# Patient Record
Sex: Female | Born: 1935 | Race: White | Hispanic: No | Smoking: Never smoker
Health system: Southern US, Community
[De-identification: ages and names within clinical notes are randomized; demographics above are authoritative.]

## PROBLEM LIST (undated history)

## (undated) DIAGNOSIS — J189 Pneumonia, unspecified organism: Secondary | ICD-10-CM

## (undated) DIAGNOSIS — M1990 Unspecified osteoarthritis, unspecified site: Secondary | ICD-10-CM

## (undated) DIAGNOSIS — E039 Hypothyroidism, unspecified: Secondary | ICD-10-CM

## (undated) DIAGNOSIS — R0489 Hemorrhage from other sites in respiratory passages: Secondary | ICD-10-CM

## (undated) DIAGNOSIS — K219 Gastro-esophageal reflux disease without esophagitis: Secondary | ICD-10-CM

## (undated) HISTORY — DX: Gastro-esophageal reflux disease without esophagitis: K21.9

## (undated) HISTORY — DX: Pneumonia, unspecified organism: J18.9

## (undated) HISTORY — DX: Hemorrhage from other sites in respiratory passages: R04.89

## (undated) HISTORY — DX: Hypothyroidism, unspecified: E03.9

## (undated) HISTORY — DX: Unspecified osteoarthritis, unspecified site: M19.90

---

## 1957-01-25 HISTORY — PX: TOTAL ABDOMINAL HYSTERECTOMY: SHX209

## 1968-01-26 HISTORY — PX: CARPAL TUNNEL RELEASE: SHX101

## 1977-01-25 DIAGNOSIS — R0489 Hemorrhage from other sites in respiratory passages: Secondary | ICD-10-CM

## 1977-01-25 HISTORY — DX: Hemorrhage from other sites in respiratory passages: R04.89

## 2001-01-25 HISTORY — PX: TOTAL KNEE ARTHROPLASTY: SHX125

## 2002-09-18 ENCOUNTER — Encounter: Payer: Self-pay | Admitting: Specialist

## 2002-09-21 ENCOUNTER — Inpatient Hospital Stay (HOSPITAL_COMMUNITY): Admission: RE | Admit: 2002-09-21 | Discharge: 2002-09-27 | Payer: Self-pay | Admitting: Specialist

## 2002-09-26 ENCOUNTER — Encounter: Payer: Self-pay | Admitting: Internal Medicine

## 2007-01-12 ENCOUNTER — Ambulatory Visit: Payer: Self-pay | Admitting: Pulmonary Disease

## 2007-01-12 DIAGNOSIS — E039 Hypothyroidism, unspecified: Secondary | ICD-10-CM | POA: Insufficient documentation

## 2007-01-12 DIAGNOSIS — J479 Bronchiectasis, uncomplicated: Secondary | ICD-10-CM

## 2007-01-12 DIAGNOSIS — R0609 Other forms of dyspnea: Secondary | ICD-10-CM

## 2007-01-12 DIAGNOSIS — K219 Gastro-esophageal reflux disease without esophagitis: Secondary | ICD-10-CM | POA: Insufficient documentation

## 2007-01-12 DIAGNOSIS — M1990 Unspecified osteoarthritis, unspecified site: Secondary | ICD-10-CM | POA: Insufficient documentation

## 2007-01-12 DIAGNOSIS — J309 Allergic rhinitis, unspecified: Secondary | ICD-10-CM | POA: Insufficient documentation

## 2007-01-13 ENCOUNTER — Telehealth (INDEPENDENT_AMBULATORY_CARE_PROVIDER_SITE_OTHER): Payer: Self-pay | Admitting: *Deleted

## 2007-01-13 ENCOUNTER — Encounter: Payer: Self-pay | Admitting: Pulmonary Disease

## 2007-01-17 ENCOUNTER — Telehealth (INDEPENDENT_AMBULATORY_CARE_PROVIDER_SITE_OTHER): Payer: Self-pay | Admitting: *Deleted

## 2007-02-10 ENCOUNTER — Ambulatory Visit: Payer: Self-pay | Admitting: Pulmonary Disease

## 2007-02-10 DIAGNOSIS — J449 Chronic obstructive pulmonary disease, unspecified: Secondary | ICD-10-CM

## 2007-02-10 DIAGNOSIS — J4489 Other specified chronic obstructive pulmonary disease: Secondary | ICD-10-CM | POA: Insufficient documentation

## 2007-04-18 ENCOUNTER — Telehealth (INDEPENDENT_AMBULATORY_CARE_PROVIDER_SITE_OTHER): Payer: Self-pay | Admitting: *Deleted

## 2007-11-29 ENCOUNTER — Ambulatory Visit: Payer: Self-pay | Admitting: Genetic Counselor

## 2008-11-07 ENCOUNTER — Encounter: Payer: Self-pay | Admitting: Internal Medicine

## 2009-02-26 ENCOUNTER — Encounter: Admission: RE | Admit: 2009-02-26 | Discharge: 2009-02-26 | Payer: Self-pay | Admitting: Specialist

## 2009-04-11 ENCOUNTER — Telehealth (INDEPENDENT_AMBULATORY_CARE_PROVIDER_SITE_OTHER): Payer: Self-pay | Admitting: *Deleted

## 2009-04-15 ENCOUNTER — Ambulatory Visit: Payer: Self-pay | Admitting: Internal Medicine

## 2009-04-17 ENCOUNTER — Telehealth: Payer: Self-pay | Admitting: Internal Medicine

## 2009-04-17 ENCOUNTER — Encounter: Payer: Self-pay | Admitting: Internal Medicine

## 2009-05-14 ENCOUNTER — Ambulatory Visit: Payer: Self-pay | Admitting: Internal Medicine

## 2009-05-22 ENCOUNTER — Encounter: Payer: Self-pay | Admitting: Internal Medicine

## 2009-05-26 ENCOUNTER — Telehealth: Payer: Self-pay | Admitting: Internal Medicine

## 2009-05-30 ENCOUNTER — Telehealth (INDEPENDENT_AMBULATORY_CARE_PROVIDER_SITE_OTHER): Payer: Self-pay | Admitting: *Deleted

## 2009-07-08 ENCOUNTER — Encounter: Payer: Self-pay | Admitting: Internal Medicine

## 2009-07-16 ENCOUNTER — Encounter: Payer: Self-pay | Admitting: Internal Medicine

## 2009-07-21 ENCOUNTER — Telehealth (INDEPENDENT_AMBULATORY_CARE_PROVIDER_SITE_OTHER): Payer: Self-pay | Admitting: *Deleted

## 2009-08-27 ENCOUNTER — Telehealth (INDEPENDENT_AMBULATORY_CARE_PROVIDER_SITE_OTHER): Payer: Self-pay | Admitting: *Deleted

## 2010-02-22 LAB — CONVERTED CEMR LAB
Albumin: 3.8 g/dL (ref 3.5–5.2)
Basophils Absolute: 0.1 10*3/uL (ref 0.0–0.1)
Chloride: 103 meq/L (ref 96–112)
Eosinophils Absolute: 0.4 10*3/uL (ref 0.0–0.6)
GFR calc Af Amer: 69 mL/min
GFR calc non Af Amer: 57 mL/min
Glucose, Bld: 130 mg/dL — ABNORMAL HIGH (ref 70–99)
HCT: 38.8 % (ref 36.0–46.0)
Hemoglobin: 13.2 g/dL (ref 12.0–15.0)
IgM, Serum: 73 mg/dL (ref 60–263)
Lymphocytes Relative: 33.1 % (ref 12.0–46.0)
MCHC: 34 g/dL (ref 30.0–36.0)
MCV: 92.4 fL (ref 78.0–100.0)
Neutro Abs: 3.1 10*3/uL (ref 1.4–7.7)
Neutrophils Relative %: 52.8 % (ref 43.0–77.0)
Platelets: 231 10*3/uL (ref 150–400)
RBC: 4.19 M/uL (ref 3.87–5.11)
Rhuematoid fact SerPl-aCnc: <20 intl units/mL — ABNORMAL LOW (ref 0.0–20.0)
Sed Rate: 23 mm/hr (ref 0–25)
Sodium: 140 meq/L (ref 135–145)
Total Bilirubin: 0.6 mg/dL (ref 0.3–1.2)

## 2010-02-26 NOTE — Progress Notes (Signed)
Summary: OXYGEN INCREASE  Phone Note Call from Patient   Caller: Patient Call For: WERT Summary of Call: CALLING TO SEE IF ORDER TO HAVE OXYGEN INCREASED HAS BEEN DONE Initial call taken by: Rickard Patience,  May 30, 2009 9:39 AM  Follow-up for Phone Call        Pt informed that order was sent to [facility] in [place] on 05-26-09.  Pt is going to call company to f/u. Abigail Miyamoto RN  May 30, 2009 9:49 AM

## 2010-02-26 NOTE — Progress Notes (Signed)
Summary: ? regarding neb meds  Phone Note Call from Patient Call back at [facility] Phone [phone]   Caller: Patient Call For: Roey Coopman Reason for Call: Talk to Nurse Summary of Call: pt was here, given Budesonide.  Told to use 1 vial in nebulizer with her Brovana.  BUT, her Rosalyn Gess says not to use anything with it.  Please advise. Initial call taken by: Eugene Gavia,  April 17, 2009 3:44 PM  Follow-up for Phone Call        advised ok to mix these together.Carron Curie CMA  April 17, 2009 4:46 PM

## 2010-02-26 NOTE — Progress Notes (Signed)
Summary: Rx to levaquin, requesting Avelox  Phone Note Call from Patient   Caller: Patient Call For: wert Summary of Call: levaquin causing nausea would like avelox instead. prev[facility] pharmacy  Initial call taken by: Rickard Patience,  July 21, 2009 9:07 AM  Follow-up for Phone Call        Spoke with pt.  Pt states she started levaquin on Friday night because she was coughing up yellow mucus.  States she has taking it for 3 days but it is causing nausea.  States she is taking it with a meal.  Rqeuesting levaquin to be changed.  States she has taken avelox in the past and this worked well.  [facility].  Will forward message to MW-pls advise if ok to change levaquin to another abx.  Thanks!  ***ok to leave message on pt's answering machine if she is not home*** Follow-up by: Gweneth Dimitri RN,  July 21, 2009 9:27 AM  Additional Follow-up for Phone Call Additional follow up Details #1::        ok with me avelox x 5 days Additional Follow-up by: Nyoka Cowden MD,  July 21, 2009 10:02 AM    Additional Follow-up for Phone Call Additional follow up Details #2::    Rx was sent to prevo in [place].  LMOM for pt to be made aware Follow-up by: Vernie Murders,  July 21, 2009 10:07 AM  New/Updated Medications: AVELOX 400 MG TABS (MOXIFLOXACIN HCL) 1 by mouth once daily until gone Prescriptions: AVELOX 400 MG TABS (MOXIFLOXACIN HCL) 1 by mouth once daily until gone  #5 x 0   Entered by:   Vernie Murders   Authorized by:   Nyoka Cowden MD   Signed by:   Vernie Murders on 07/21/2009   Method used:   Electronically to        [facility] (retail)       [address]       [place], [place]  [address]       Ph: [phone] or [phone]       Fax: [phone]   RxID:   [id number]

## 2010-02-26 NOTE — Medication Information (Signed)
Summary: [facility]  [facility]   Imported By: Sherian Rein 04/23/2009 07:31:23  _____________________________________________________________________  External Attachment:    Type:   Image     Comment:   External Document

## 2010-02-26 NOTE — Progress Notes (Signed)
Summary: ONO results given  Phone Note Outgoing Call   Initial call taken by: Vernie Murders,  May 26, 2009 3:42 PM Call placed by: Vernie Murders,  May 26, 2009 3:43 PM Call placed to: Patient Summary of Call: Spoke with pt and advised that per MW- her ONO results show that pt does need nocturnal o2 at 2 lpm.  Pt verbalized understanding and states that she has been on 1.5 lpm at hs previously.  Will send order to [facility] to have increased to 2 lpm. Initial call taken by: Vernie Murders,  May 26, 2009 3:44 PM

## 2010-02-26 NOTE — Assessment & Plan Note (Signed)
Summary: Pulmonary/ new pt eval for bronchiectasis/ hfa 75% effective   Visit Type:  Initial Consult Primary Provider/Referring Provider:  Dr. Martha Clan  CC:  COPD.  History of Present Illness: 43 yowf never smoker carries dx of bronchiectasis since the 80's on xopenex daily with "good control" then switched over to brovana and not doing as well since then.  April 15, 2009 sp hospitalization at [facility] with pna 2/18 - 20/11 and much better in am after brovana at 8 am but by 2 pm feels she needs something else so using various other bronchodilators.  Pt denies any significant sore throat, dysphagia, itching, sneezing,  nasal congestion or excess secretions,  fever, chills, sweats, unintended wt loss, pleuritic or exertional cp, hempoptysis, change in activity tolerance  orthopnea pnd or leg swelling Pt also denies any obvious fluctuation in symptoms with weather or environmental change or other alleviating or aggravating factors.     no typical noct or early am flares  Current Medications (verified): 1)  Synthroid 50 Mcg  Tabs (Levothyroxine Sodium) .Marland Kitchen.. 1 By Mouth Daily 2)  Proventil Hfa 108 (90 Base) Mcg/act  Aers (Albuterol Sulfate) .... As Needed 3)  Flonase 50 Mcg/act  Susp (Fluticasone Propionate) .Marland Kitchen.. 1 Spray Eash Nostril At Bedtime As Needed 4)  Adult Aspirin Low Strength 81 Mg  Tbdp (Aspirin) .Marland Kitchen.. 1 By Mouth Daily 5)  Ranitidine Hcl 150 Mg Caps (Ranitidine Hcl) .Marland Kitchen.. 1 Once Daily 6)  Mucinex 600 Mg  Tb12 (Guaifenesin) .Marland Kitchen.. 1 By Mouth As Needed 7)  Oxygen 1.5 Liters .... At Bedtime 8)  Brovana 15 Mcg/37ml Nebu (Arformoterol Tartrate) .Marland Kitchen.. 1 Vial in Nebulizer Two Times A Day 9)  Vitamin C 500 Mg Tabs (Ascorbic Acid) .Marland Kitchen.. 1 Once Daily 10)  Vitamin D3 1000 Unit Caps (Cholecalciferol) .Marland Kitchen.. 1 Once Daily 11)  Coq10 30 Mg Caps (Coenzyme Q10) .Marland Kitchen.. 1 Once Daily 12)  Fish Oil 1000 Mg Caps (Omega-3 Fatty Acids) .Marland Kitchen.. 1 Once Daily 13)  Preservision Areds  Caps (Multiple Vitamins-Minerals) .Marland Kitchen..  1 Once Daily 14)  Ra Calcium Citrate Plus Vit D 315-200 Mg-Unit Tabs (Calcium Citrate-Vitamin D) .Marland Kitchen.. 1 Two Times A Day 15)  Centrum Silver  Tabs (Multiple Vitamins-Minerals) .Marland Kitchen.. 1 Once Daily 16)  Travatan Z 0.004 % Soln (Travoprost) .Marland Kitchen.. 1 Drop Each Eye At Bedtime 17)  Crestor 5 Mg Tabs (Rosuvastatin Calcium) .Marland Kitchen.. 1 Once Daily 18)  Tramadol-Acetaminophen 37.5-325 Mg Tabs (Tramadol-Acetaminophen) .Marland Kitchen.. 1 To 2 Every 6 Hours As Needed 19)  Vitamin E 400 Unit Caps (Vitamin E) .Marland Kitchen.. 1 Once Daily 20)  Vitamin B-6 100 Mg Tabs (Pyridoxine Hcl) .Marland Kitchen.. 1 Once Daily 21)  Restasis 0.05 % Emul (Cyclosporine) .Marland Kitchen.. 1 Drop To Each Eye Two Times A Day 22)  Ipratropium-Albuterol 0.5-2.5 (3) Mg/45ml Soln (Ipratropium-Albuterol) .Marland Kitchen.. 1 Vial in [employer] Once Daily  Allergies (verified): 1)  ! Amoxicillin  Past History:  Past Medical History: Bronchiectasis     - CT  11/07/08:  Bronchiectasis in the lingular > rml plus small HH     - HFA 235 > 75% effective April 15, 2009  Pneumonia Allergic Rhinitis Pulmonary hemorrhage 1979 Osteoarthritis Hypothyroidism GERD  Social History: Patient never smoked.  No significant alcohol use. Used to do office work. No occupational exposure, no animal exposure, no significant travel history. Lives alone  Review of Systems       The patient complains of shortness of breath with activity, acid heartburn, weight change, nasal congestion/difficulty breathing through nose, and sneezing.  The patient  denies shortness of breath at rest, productive cough, non-productive cough, coughing up blood, chest pain, irregular heartbeats, indigestion, loss of appetite, abdominal pain, difficulty swallowing, sore throat, tooth/dental problems, headaches, itching, ear ache, anxiety, depression, hand/feet swelling, joint stiffness or pain, rash, change in color of mucus, and fever.    Vital Signs:  Patient profile:   77 year old female Height:      64 inches Weight:      133.50  pounds BMI:     23.00 O2 Sat:      95 % on Room air Temp:     97.8 degrees F oral Pulse rate:   71 / minute BP sitting:   122 / 64  (left arm)  Vitals Entered By: Vernie Murders (April 15, 2009 8:54 AM)  O2 Flow:  Room air  Physical Exam  Additional Exam:  pleasant elderly wf < stated age wt 136 > 133 April 15, 2009  HEENT: nl dentition, turbinates, and orophanx. Nl external ear canals without cough reflex NECK :  without JVD/Nodes/TM/ nl carotid upstrokes bilaterally LUNGS: no acc muscle use,  a few insp pops/squeaks  without cough on insp or exp maneuvers CV:  RRR  no s3 or murmur or increase in P2, no edema  ABD:  soft and nontender with nl excursion in the supine position. No bruits or organomegaly, bowel sounds nl MS:  warm without deformities, calf tenderness, cyanosis or clubbing SKIN: warm and dry without lesions   NEURO:  alert, approp, no deficits     Impression & Recommendations:  Problem # 1:  BRONCHIECTASIS (ICD-494.0) Longstanding and apparently assoc with sign airway hyperactivity.    DDX of  difficult airways managment all start with A and  include Adherence, Ace Inhibitors, Acid Reflux, Active Sinus Disease, Alpha 1 Antitripsin deficiency, Anxiety masquerading as Airways dz,  ABPA,  allergy(esp in young), Aspiration (esp in elderly), Adverse effects of DPI,  Active smokers, plus one B  = Beta blocker use..   In this case Adherence is the likely the biggest issue and starts with  inability to use HFA effectively and also  understand that SABA treats the symptoms but doesn't get to the underlying problem (inflammation).  I used  the ananology of putting steroid cream on a rash to help explain the meaning of topical therapy and the need to get the drug to the target tissue.   Therefore needs ICS even if there is a risk of glaucoma so will need careful f/u by opthamology and trial of budosonide.  I spent extra time with the patient today explaining optimal mdi   technique.  This improved from  25-75% effective  Medications Added to Medication List This Visit: 1)  Ranitidine Hcl 150 Mg Caps (Ranitidine hcl) .Marland Kitchen.. 1 once daily 2)  Oxygen 1.5 Liters  .... At bedtime 3)  Brovana 15 Mcg/32ml Nebu (Arformoterol tartrate) .Marland Kitchen.. 1 vial in nebulizer two times a day 4)  Vitamin C 500 Mg Tabs (Ascorbic acid) .Marland Kitchen.. 1 once daily 5)  Vitamin D3 1000 Unit Caps (Cholecalciferol) .Marland Kitchen.. 1 once daily 6)  Coq10 30 Mg Caps (Coenzyme q10) .Marland Kitchen.. 1 once daily 7)  Fish Oil 1000 Mg Caps (Omega-3 fatty acids) .Marland Kitchen.. 1 once daily 8)  Preservision Areds Caps (Multiple vitamins-minerals) .Marland Kitchen.. 1 once daily 9)  Ra Calcium Citrate Plus Vit D 315-200 Mg-unit Tabs (Calcium citrate-vitamin d) .Marland Kitchen.. 1 two times a day 10)  Centrum Silver Tabs (Multiple vitamins-minerals) .Marland Kitchen.. 1 once daily 11)  Travatan Z  0.004 % Soln (Travoprost) .Marland Kitchen.. 1 drop each eye at bedtime 12)  Crestor 5 Mg Tabs (Rosuvastatin calcium) .Marland Kitchen.. 1 once daily 13)  Tramadol-acetaminophen 37.5-325 Mg Tabs (Tramadol-acetaminophen) .Marland Kitchen.. 1 to 2 every 6 hours as needed 14)  Vitamin E 400 Unit Caps (Vitamin e) .Marland Kitchen.. 1 once daily 15)  Vitamin B-6 100 Mg Tabs (Pyridoxine hcl) .Marland Kitchen.. 1 once daily 16)  Restasis 0.05 % Emul (Cyclosporine) .Marland Kitchen.. 1 drop to each eye two times a day 17)  Ipratropium-albuterol 0.5-2.5 (3) Mg/48ml Soln (Ipratropium-albuterol) .Marland Kitchen.. 1 vial in nebulzer once daily 18)  Levaquin 750 Mg Tabs (Levofloxacin) .... One tablet by mouth daily x 5 days 19)  Budesonide 0.25 Mg/50ml Susp (Budesonide) .... One twice daily with brovana  Other Orders: Misc. Referral (Misc. Ref) Misc. Referral (Misc. Ref) New Patient Level V (04540) HFA Instruction [phone])  Patient Instructions: 1)  Add budesonide 0.25 mg twice daily 2)  I will contact Dr Gwen Pounds at [facility] to get his permission to continue this longterm 3)  stop ipatropium 4)  xopenex hfa 2 puffs every 4 hours if needed 5)  GERD (REFLUX)  is a common cause of respiratory  symptoms. It commonly presents without heartburn and can be treated with medication, but also with lifestyle changes including avoidance of late meals, excessive alcohol, smoking cessation, and avoid fatty foods, chocolate, peppermint, colas, red wine, and acidic juices such as orange juice. NO MINT OR MENTHOL PRODUCTS SO NO COUGH DROPS  6)  USE SUGARLESS CANDY INSTEAD (jolley ranchers)  7)  Please schedule a follow-up appointment in 4 weeks, sooner if needed  8)  NO OIL BASED VITAMINS  9)  See Patient Care Coordinator before leaving for CT Chest 10)  Please schedule a follow-up appointment in 4 weeks, sooner if needed  Prescriptions: BUDESONIDE 0.25 MG/2ML SUSP (BUDESONIDE) one twice daily with brovana  #120 x 11   Entered and Authorized by:   Nyoka Cowden MD   Signed by:   Nyoka Cowden MD on 04/15/2009   Method used:   Print then Give to Patient   RxID:   [id number] LEVAQUIN 750 MG  TABS (LEVOFLOXACIN) One tablet by mouth daily x 5 days  #5 x 11   Entered and Authorized by:   Nyoka Cowden MD   Signed by:   Nyoka Cowden MD on 04/15/2009   Method used:   Electronically to        [facility] (retail)       [address]       [place], [place]  [address]       Ph: [phone] or [phone]       Fax: [phone]   RxID:   [id number]

## 2010-02-26 NOTE — Assessment & Plan Note (Signed)
Summary: Pulmonary/ final f/u ov   Primary Provider/Referring Provider:  Dr. Martha Clan  CC:  4 wk followup. Pt states that her breathing has improved and cough has resolved.  She c/o hoarsness since starting on budesonide.  She states that she gets hoarse after she uses med and then later the hoarsness wears off.  No other complaints today.Marland Kitchen  History of Present Illness: 81 yowf never smoker carries dx of bronchiectasis since the 80's on xopenex daily with "good control" then switched over to brovana and not doing as well since then.  April 15, 2009 sp hospitalization at [facility] with pna 2/18 - 20/11 and much better in am after brovana at 8 am but by 2 pm feels she needs something else so using various other bronchodilators.  rec add budesonide.  May 14, 2009 4 wk followup. Pt states that her breathing has improved and cough has resolved.  She c/o hoarsness since starting on budesonide.  She states that she gets hoarse after she uses med and then later the hoarsness wears off.  No other complaints today. Pt denies any significant sore throat, dysphagia, itching, sneezing,  nasal congestion or excess secretions,  fever, chills, sweats, unintended wt loss, pleuritic or exertional cp, hempoptysis, change in activity tolerance  orthopnea pnd or leg swelling Pt also denies any obvious fluctuation in symptoms with weather or environmental change or other alleviating or aggravating factors.    Pt denies any increase in rescue therapy over baseline, denies waking up needing it or having early am exacerbations of coughing/wheezing/ or dyspnea    Current Medications (verified): 1)  Synthroid 50 Mcg  Tabs (Levothyroxine Sodium) .Marland Kitchen.. 1 By Mouth Daily 2)  Flonase 50 Mcg/act  Susp (Fluticasone Propionate) .Marland Kitchen.. 1 Spray Eash Nostril At Bedtime As Needed 3)  Adult Aspirin Low Strength 81 Mg  Tbdp (Aspirin) .Marland Kitchen.. 1 By Mouth Daily 4)  Ranitidine Hcl 150 Mg Caps (Ranitidine Hcl) .Marland Kitchen.. 1 Once Daily 5)  Mucinex 600  Mg  Tb12 (Guaifenesin) .Marland Kitchen.. 1 By Mouth As Needed 6)  Oxygen 1.5 Liters .... At Bedtime 7)  Brovana 15 Mcg/23ml Nebu (Arformoterol Tartrate) .Marland Kitchen.. 1 Vial in Nebulizer Two Times A Day 8)  Vitamin C 500 Mg Tabs (Ascorbic Acid) .Marland Kitchen.. 1 Once Daily 9)  Vitamin D3 1000 Unit Caps (Cholecalciferol) .Marland Kitchen.. 1 Once Daily 10)  Coq10 30 Mg Caps (Coenzyme Q10) .Marland Kitchen.. 1 Once Daily 11)  Preservision Areds  Caps (Multiple Vitamins-Minerals) .Marland Kitchen.. 1 Once Daily 12)  Ra Calcium Citrate Plus Vit D 315-200 Mg-Unit Tabs (Calcium Citrate-Vitamin D) .Marland Kitchen.. 1 Two Times A Day 13)  Centrum Silver  Tabs (Multiple Vitamins-Minerals) .Marland Kitchen.. 1 Once Daily 14)  Travatan Z 0.004 % Soln (Travoprost) .Marland Kitchen.. 1 Drop Each Eye At Bedtime 15)  Crestor 5 Mg Tabs (Rosuvastatin Calcium) .Marland Kitchen.. 1 Once Daily 16)  Tramadol-Acetaminophen 37.5-325 Mg Tabs (Tramadol-Acetaminophen) .Marland Kitchen.. 1 To 2 Every 6 Hours As Needed 17)  Vitamin B-6 100 Mg Tabs (Pyridoxine Hcl) .Marland Kitchen.. 1 Once Daily 18)  Restasis 0.05 % Emul (Cyclosporine) .Marland Kitchen.. 1 Drop To Each Eye Two Times A Day 19)  Levaquin 750 Mg  Tabs (Levofloxacin) .... One Tablet By Mouth Daily X 5 Days 20)  Budesonide 0.25 Mg/73ml Susp (Budesonide) .... One Twice Daily With Brovana  Allergies (verified): 1)  ! Amoxicillin  Past History:  Past Medical History: Bronchiectasis     - CT  11/07/08:  Bronchiectasis in the lingular > rml plus small HH     - Sinus CT neg  04/17/09     - HFA 25 > 75%  effective April 15, 2009  Pneumonia Allergic Rhinitis Pulmonary hemorrhage 1979 Osteoarthritis Hypothyroidism GERD  Vital Signs:  Patient profile:   77 year old female Weight:      134.38 pounds O2 Sat:      93 % on Room air Temp:     97.6 degrees F oral Pulse rate:   76 / minute BP sitting:   142 / 76  (left arm)  Vitals Entered By: Vernie Murders (May 14, 2009 9:18 AM)  O2 Flow:  Room air  Physical Exam  Additional Exam:  pleasant elderly wf < stated age wt  133 April 15, 2009 > 134 May 14, 2009  HEENT: nl  dentition, turbinates, and orophanx. Nl external ear canals without cough reflex NECK :  without JVD/Nodes/TM/ nl carotid upstrokes bilaterally LUNGS: no acc muscle use,  a few insp pops/squeaks  without cough on insp or exp maneuvers CV:  RRR  no s3 or murmur or increase in P2, no edema  ABD:  soft and nontender with nl excursion in the supine position. No bruits or organomegaly, bowel sounds nl MS:  warm without deformities, calf tenderness, cyanosis or clubbing    Impression & Recommendations:  Problem # 1:  BRONCHIECTASIS (ICD-494.0) Much better control in terms of airways reactivity on option : change to Brovana and Budesonide, the equivalent of Symbicort, per neb.    Each maintenance medication was reviewed in detail including most importantly the difference between maintenance and as needed and under what circumstances the prns are to be used. See instructions for specific recommendations   Recheck sats overnight as no longer feels she needs 02 during the day and has improved so much symptomatically.  If no > 5 min @ < 89% ok to d/c 02  Medications Added to Medication List This Visit: 1)  Ranitidine Hcl 150 Mg Caps (Ranitidine hcl) .Marland Kitchen.. 1 at bedtime  Other Orders: Misc. Referral (Misc. Ref) Est. Patient Level III (44010)  Patient Instructions: 1)  Continue brovana and budesonide twice daily 2)  If your breathing worsens or you need to use your rescue inhaler (xopenex) more than twice weekly or wake up more than twice a month with any respiratory symptoms or require more than two rescue inhalers per year, we need to see you right away. 3)  Levaquin x 5 days if mucus gets nasty  4)  See Patient Care Coordinator before leaving for repeat pulse ox on Room air. If we find your oxygen level is very low, you'll need 02 on trips  5)

## 2010-02-26 NOTE — Progress Notes (Signed)
Summary: Avelox x 5 days ok further rx per her primary  Phone Note Call from Patient   Caller: Patient Call For: wert Summary of Call: need antibiotic to carry out of town would like avelox. prevo drug  [place] Initial call taken by: Rickard Patience,  August 27, 2009 9:49 AM  Follow-up for Phone Call        Called, spoke with pt.  Pt states she is "supposed to keep abx on hand."  Is going out of town for a week - requesting avelox rx to take with her and would like for it to have refills if possible.  [facility].  Will forward message to MW -pls advise if this is ok.  Thanks! Follow-up by: Gweneth Dimitri RN,  August 27, 2009 10:09 AM  Additional Follow-up for Phone Call Additional follow up Details #1::        ok x 5 days rx only - my last note indicates no longer plan regular f/u here and she can get this thru Dr Samuel Germany office in future or return here for f/u. Additional Follow-up by: Nyoka Cowden MD,  August 27, 2009 1:19 PM    Additional Follow-up for Phone Call Additional follow up Details #2::    called and spoke with pt.  pt aware of MW's recs.  informed her for future abx she will need to either be seen here by MW or can see if Dr. Samuel Germany office will send scripts.  pt verbalized understanding and stated she will check with Dr. Samuel Germany office in the future.  rx sent to pharmacy.  Aundra Millet Reynolds LPN  August 27, 2009 1:52 PM   Prescriptions: AVELOX 400 MG TABS (MOXIFLOXACIN HCL) 1 by mouth once daily until gone  #5 x 0   Entered by:   Arman Filter LPN   Authorized by:   Nyoka Cowden MD   Signed by:   Arman Filter LPN on 16/10/9602   Method used:   Electronically to        [facility] (retail)       [address]       [place], [place]  [address]       Ph: [phone] or [phone]       Fax: [phone]   RxID:   [id number]

## 2010-02-26 NOTE — Procedures (Signed)
Summary: Pulse Oximetry/IDS  Pulse Oximetry/IDS   Imported By: Sherian Rein 05/30/2009 09:58:36  _____________________________________________________________________  External Attachment:    Type:   Image     Comment:   External Document

## 2010-02-26 NOTE — Progress Notes (Signed)
Summary: change providers  Phone Note Call from Patient Call back at [phone]   Caller: Patient Call For: sood Summary of Call: Saw Dr. Craige Cotta on 1/09 would like to change providers. Initial call taken by: Darletta Moll,  April 11, 2009 9:13 AM  Follow-up for Phone Call        called, pt's home number - [facility] Gweneth Dimitri RN  April 11, 2009 9:34 AM  spoke with pt.  Pt states she would like to change from Dr. Craige Cotta to another Dr but does not have a preference as to who she wants to change to.  Advised pt that we have to have Vs's aproval for her to change doctors but he's not back in office until Monday.  She is ok with this.  Will forward to VS-pls advise if this is ok with you.  Thanks!  Follow-up by: Gweneth Dimitri RN,  April 11, 2009 10:27 AM  Additional Follow-up for Phone Call Additional follow up Details #1::        They way things were left in 2009 was that she was to follow up with her pulmonologist in [place] since her management was being done appropriately there.  If she would like to see another provider, I have no problem with this. Additional Follow-up by: Coralyn Helling MD,  April 13, 2009 10:56 AM     Appended Document: change providers Pt sched to see MW tommorrow am at 8:45- aware to bring all active meds and cd of her recent cxr from [place] with her to appt.  Pt verbalized understanding.

## 2010-02-26 NOTE — Medication Information (Signed)
Summary: [facility]   Imported By: Lester [place] 07/23/2009 09:52:26  _____________________________________________________________________  External Attachment:    Type:   Image     Comment:   External Document

## 2010-02-26 NOTE — Medication Information (Signed)
Summary: [facility]   Imported By: Lester [place] 07/11/2009 11:00:26  _____________________________________________________________________  External Attachment:    Type:   Image     Comment:   External Document

## 2010-03-31 ENCOUNTER — Telehealth (INDEPENDENT_AMBULATORY_CARE_PROVIDER_SITE_OTHER): Payer: Self-pay | Admitting: *Deleted

## 2010-03-31 ENCOUNTER — Encounter: Payer: Self-pay | Admitting: Internal Medicine

## 2010-04-03 ENCOUNTER — Telehealth (INDEPENDENT_AMBULATORY_CARE_PROVIDER_SITE_OTHER): Payer: Self-pay | Admitting: *Deleted

## 2010-04-07 NOTE — Progress Notes (Signed)
Summary: refills for Budesonide and Brovana  Phone Note Call from Patient Call back at [facility] Phone [phone]   Caller: Patient Call For: wert Summary of Call: Patient phoned stated that she is almost out of her Budesonide and Brovana patient stated that [facility] sent the request for the Budesonide last Thursday and have not recieved a response back. Lacey Stanton can be reached at [phone] please leave a message if she isnt home. Initial call taken by: Vedia Coffer,  March 31, 2010 8:34 AM  Follow-up for Phone Call        Papers signed by Dr Sherene Sires and faxed back to [facility].  LM for pt that refill info was faxed back .  Also that she is due for ov with Dr Sherene Sires. Lacey Miyamoto RN  March 31, 2010 10:39 AM

## 2010-04-14 NOTE — Medication Information (Signed)
Summary: Brovana/[facility]  Brovana/[facility]   Imported By: Lester Elma 04/07/2010 08:12:29  _____________________________________________________________________  External Attachment:    Type:   Image     Comment:   External Document

## 2010-04-14 NOTE — Progress Notes (Signed)
Summary: fax request  Phone Note From Other Clinic   Caller: judy w/ [facility] Call For: wert Summary of Call: needs last ov- also med list and diagnosis re: use of brovana and budesonid. this is for medicare audit. fax to attn: judy [phone]. contact # is [phone] x [phone] Initial call taken by: Tivis Ringer, CNA,  April 03, 2010 2:13 PM  Follow-up for Phone Call        Faxed records.//Juanita Follow-up by: Darletta Moll,  April 06, 2010 11:02 AM

## 2010-04-20 ENCOUNTER — Encounter: Payer: Self-pay | Admitting: Internal Medicine

## 2010-04-21 ENCOUNTER — Encounter: Payer: Self-pay | Admitting: Internal Medicine

## 2010-04-21 ENCOUNTER — Ambulatory Visit (INDEPENDENT_AMBULATORY_CARE_PROVIDER_SITE_OTHER)
Admission: RE | Admit: 2010-04-21 | Discharge: 2010-04-21 | Disposition: A | Discharge status: Home / Self Care | Payer: Medicare Other | Source: Ambulatory Visit | Attending: Internal Medicine | Admitting: Internal Medicine

## 2010-04-21 ENCOUNTER — Ambulatory Visit (INDEPENDENT_AMBULATORY_CARE_PROVIDER_SITE_OTHER): Payer: Medicare Other | Admitting: Internal Medicine

## 2010-04-21 VITALS — BP 130/68 | HR 66 | Temp 98.6°F | Ht 63.5 in | Wt 136.5 lb

## 2010-04-21 DIAGNOSIS — J479 Bronchiectasis, uncomplicated: Secondary | ICD-10-CM

## 2010-04-21 DIAGNOSIS — R0609 Other forms of dyspnea: Secondary | ICD-10-CM

## 2010-04-21 DIAGNOSIS — J449 Chronic obstructive pulmonary disease, unspecified: Secondary | ICD-10-CM

## 2010-04-21 DIAGNOSIS — R0600 Dyspnea, unspecified: Secondary | ICD-10-CM

## 2010-04-21 NOTE — Assessment & Plan Note (Signed)
Hi tammy

## 2010-04-21 NOTE — Progress Notes (Signed)
  Subjective:    Patient ID: Lacey Stanton, female    DOB: 1926/02/29, 77 y.o.   MRN: [id number]  HPI   34  yowf never smoker carries dx of bronchiectasis since the 80's on xopenex daily with "good control" then switched over to brovana and not doing as well since then.   April 15, 2009 sp hospitalization at [facility] with pna 2/18 - 20/11 and much better in am after brovana at 8 am but by 2 pm feels she needs something else so using various other bronchodilators. rec add budesonide to brovana and treat like asthma sinc needing so much albuterol.  May 14, 2009 4 wk followup. Pt states that her breathing has improved and cough has resolved. rec no change  04/21/2010 ov for surgical clearance for R TKR.  Sleeps on 02 every night no am exac walking around [place] do get short of breath walking to curb but does not need to stop  Past Medical History:  Bronchiectasis  - CT 11/07/08: Bronchiectasis in the lingular > rml plus small HH  - Sinus CT neg 04/17/09  - HFA 25 > 75% effective April 15, 2009  - Spirometry 04/21/2010 FEV1  0.97(53%) ratio 55%  Pneumonia  Allergic Rhinitis  Pulmonary hemorrhage 1979  Osteoarthritis      - s/p  L TKR 02/2000 >>  Difficult recovery Hypothyroidism  GERD      Review of Systems     Objective:   Physical Exam  Edentulous pleasant wf nad wt 133 April 15, 2009 > 134 May 14, 2009 > 136 04/21/2010  HEENT: nl turbinates, and orophanx. Nl external ear canals without cough reflex  NECK : without JVD/Nodes/TM/ nl carotid upstrokes bilaterally  LUNGS: no acc muscle use, a few insp pops/squeaks without cough on insp or exp maneuvers - pos hoover mid inspiration CV: RRR no s3 or murmur or increase in P2, no edema  ABD: soft and nontender with nl excursion in the supine position. No bruits or organomegaly, bowel sounds nl  MS: warm without deformities, calf tenderness, cyanosis or clubbing        Assessment & Plan:   CXR .04/21/10  There are  chronic interstitial fibrotic changes at both lung bases.

## 2010-04-21 NOTE — Patient Instructions (Addendum)
You have moderately severe airflow obstruction and are at increased risk of pulmonary complications post operatively.   If your knee gets worse to where is limiting your desired activity you should have surgery where you a have pulmonary doctor who can see you for any complications that arise  Quinine or tonic water should help with cramps if not try ginkabiloba but these are not regulated by the FDA since they are food additives

## 2010-04-21 NOTE — Assessment & Plan Note (Addendum)
   Each maintenance medication was reviewed in detail including most importantly the difference between maintenance and as needed and under what circumstances the prns are to be used.  Please see instructions for details which were reviewed in writing and the patient given a copy.    She is at significant risk of  Acute exac of copd/bronchiectaisis peri op and declined my rec she be cleared for this by a pulmonologist who has privileges at the hospital where she wants to have her surgery so will need to have it here if she wants [place] to be available to her as inpt.  I had an extended discussion with the patient today lasting 15 to 20 minutes of a 25 minute visit on the following issues:  Discussed in detail all the  indications, usual  risks and alternatives  relative to the benefits with patient who agrees to proceed with no surgery for now until knee is bad enough to warrant the risk of surgery.

## 2010-04-23 NOTE — Progress Notes (Signed)
Quick Note:  Spoke with pt and notified of results/recs per MW. Pt verbalized understanding. ______ 

## 2010-05-25 ENCOUNTER — Telehealth: Payer: Self-pay | Admitting: Internal Medicine

## 2010-05-25 NOTE — Telephone Encounter (Signed)
Spoke w/ Lacey Stanton and she states pt is in [place] hospital w/ PNA and Lacey Stanton wanted to let Dr. Sherene Sires know that Dr. Willette Pa from [facility] hospital will be contacting him today. Will forward to Dr. Sherene Sires as an Lorain Childes. Please advise. Thanks  Carver Fila, CMA

## 2010-06-05 ENCOUNTER — Inpatient Hospital Stay: Payer: Medicare Other | Admitting: Adult Health

## 2010-06-08 ENCOUNTER — Inpatient Hospital Stay: Payer: Medicare Other | Admitting: Adult Health

## 2010-06-12 NOTE — H&P (Signed)
NAME:  Lacey Stanton, Lacey Stanton NO.:  [id number]   MEDICAL RECORD NO.:  [id number]                   PATIENT TYPE:  INP   LOCATION:  NA                                   FACILITY[facility]  [facility]   PHYSICIAN:  Erasmo Leventhal, M.D.         DATE OF BIRTH:  10/01/1926   DATE OF ADMISSION:  DATE OF DISCHARGE:                                HISTORY & PHYSICAL   CHIEF COMPLAINT:  Left knee osteoarthritis.   HISTORY OF PRESENT ILLNESS:  This is a 77 year old lady with a history of  end-stage osteoarthritis of her left knee who has failed conservative  management.  Due to continued pain and difficulty ambulating with it, she is  now scheduled for a total knee replacement of the left knee.  She has seen  her medical doctor and has been given medical clearance for the surgery.  She does have a history of bronchiectasis and has been recommended to have  surgery under spinal anesthesia versus general anesthesia due to her lung  disease.  She is now scheduled for total knee replacement.  Surgery, risks,  benefits, and after care were discussed and surgery for total knee  replacement of the left knee is to go ahead as scheduled.   ALLERGIES:  AMOXICILLIN.   CURRENT MEDICATIONS:  1. Synthroid 0.05 mg daily.  2. Evista 60 mg daily.  3. Xopenex 1.25 mg nebulizer one treatment q.8h.  4. Proventil HFA two puffs q.4-6h.  5. Pravachol 80 mg one-half tablet p.o. daily.  6. Paxil CR 12.5 mg one daily.  7. Allegra 180 mg one p.o. daily.   PAST MEDICAL HISTORY:  1. Bronchiectasis.  2. Asthma.  3. GERD.  4. Depression.  5. Hypothyroidism.   PAST SURGICAL HISTORY:  1. Hysterectomy.  2. Carpal tunnel release.  3. Arthroscopy of the knee.   FAMILY HISTORY:  Positive for Alzheimer's, Parkinson's, and cancer.   SOCIAL HISTORY:  The patient is married.  Lives at home.  She does not smoke  or drink.   REVIEW OF SYSTEMS:  Central nervous system positive for depression.  CARDIOVASCULAR:  No chest pain or palpitation.  PULMONARY:  Positive for  shortness of breath, occasional PND and orthopnea.  GASTROINTESTINAL:  Positive for GERD.  GENITOURINARY:  Negative for urinary tract difficulty.  MUSCULOSKELETAL:  Positive as in HPI.   PHYSICAL EXAMINATION:  VITAL SIGNS:  Blood pressure 130/60, respirations 16,  pulse 72 and regular.  GENERAL:  This is a well-developed, well-nourished lady in no acute  distress.  HEENT:  Head normocephalic.  Nose patent.  Ears patent.  Pupils are equal,  round, and reactive to light.  Throat without injection.  NECK:  Supple without adenopathy.  Carotids 2+ without bruit.  CHEST:  Clear to auscultation.  No rales or rhonchi.  RESPIRATIONS:  16.  HEART:  Regular rate and rhythm at 72 beats per minute without murmur.  ABDOMEN:  Soft  with active bowel sounds.  No mass or organomegaly.  NEUROLOGIC:  The patient alert and oriented to time, place, and person.  Cranial nerves II-XII grossly intact.  EXTREMITIES:  Within normal limits with the exception of bilateral knee  arthritis left greater than right.  The left knee shows full extension with  further flexion to 120 degrees.  Sensation and circulation are intact.  X-  rays show end-stage osteoarthritis left knee.   IMPRESSION:  End-stage osteoarthritis left knee.   PLAN:  Total knee replacement left knee.     Jaquelyn Bitter. Chabon, P.A.                   Erasmo Leventhal, M.D.    SJC/MEDQ  D:  09/12/2002  T:  09/12/2002  Job:  [id number]

## 2010-06-12 NOTE — Discharge Summary (Signed)
NAME:  Lacey Stanton, CANAN NO.:  [id number]   MEDICAL RECORD NO.:  [id number]                   PATIENT TYPE:  INP   LOCATION:  0465                                 FACILITY:  [facility]   PHYSICIAN:  Erasmo Leventhal, M.D.         DATE OF BIRTH:  11/25/1926   DATE OF ADMISSION:  09/21/2002  DATE OF DISCHARGE:  09/27/2002                                 DISCHARGE SUMMARY   ADMITTING DIAGNOSES:  Left knee osteoarthritis.   DISCHARGE DIAGNOSES:  Left knee osteoarthritis.   OPERATION:  Total knee replacement left knee.   BRIEF HISTORY:  This is a 77 year old lady with end-stage osteoarthritis of  her left knee who failed conservative management and after discussion of  treatment options is scheduled for total knee replacement of left knee.  Her  medical history is complicated by bronchiectasis and she will have her  surgery done under spinal anesthesia.  Again, surgery, risks, benefits, and  after care were discussed with the patient.  Surgery to go ahead as  scheduled.   LABORATORIES:  Admission CBC within normal limits.  PT/PTT within normal  limits.  Admission CMET within normal limits with the exception of a  slightly low potassium and an elevated glucose.  Hemoglobin and hematocrit  reached a low of 8.2 and 23.3 on the 31st and then was back up to 10.4 and  30.5 after transfusion.  PT/PTT by discharge was in the therapeutic range  with PT 21 and INR 2.4.  She did become hyponatremic through her admission  which was treated by the medical physician and also ran an elevated glucose.   HOSPITAL COURSE:  The patient tolerated the operative procedure well.  She  was followed by the hospitalists during her admission secondary to her  bronchiectasis.  First postoperative day the patient was alert and oriented.  The dressing was dry.  Neurovascular status was grossly intact to the knee.  Hemovac was discontinued.  CPM was started that evening and  epidural  scheduled to be out the following morning.  Note that the patient cannot  take iron and so was not started on Trinsicon.  Due to her postoperative  anemia she was worked up to make sure there was no other anemia process  going on by the medical doctors and this showed just a postoperative anemia.  Second postoperative day patient continued to do well.  Vital signs were  stable.  Temperature to 100.1 max.  Neurovascular status was intact in the  leg and dressings were dry.  Dressing was changed and the wound was benign.  Third postoperative day she states she had a bad night.  She had some  moderate discomfort.  Her vital signs remained stable.  She was afebrile.  Her intake was a little more than her output.  Hemoglobin 8.9, hematocrit  26.3 and she was hyponatremic.  She is being followed by the medical doctors  for  this.  Her dressings were changed.  The wound was benign.  Popliteal  fossa was soft.  Calf muscles were negative.  No cords and negative Homan's  sign.  Lungs were clear.  Bowel sounds were sluggish.  The patient had no  shortness of breath.  On the fourth postoperative day patient continued to  do well.  Temperature was 99.8.  Dressing was changed.  Wound was benign.  Calves were negative.  Due to some mild tenderness when she was seen by the  medical doctors, Dopplers were obtained and were normal for DVT.  Her  hemoglobin and hematocrit continued to drop to 8.2 and 23.3.  She had some  mild wheezes in her lungs this a.m. but her O2 saturations were 97% on 2 L.  She had no shortness of breath, chest pain.  She was seen later that day by  the medical service who due to her chronic lung disease transfused her 2  units of packed cells to help with her oxygenation.  Postoperative day five  she was feeling better and feeling stronger after her transfusion.  Her  vital signs were stable.  She was afebrile.  Hemoglobin and hematocrit were  now 10.4 and 30.5.  BMET within  normal limits with exception of elevated  glucose.  PT was 19 with an INR of 2.  Lungs were clear.  No wheezes.  Dressings were changed.  Wound benign.  Calves were negative.  She was  continued in therapy to learn step ambulation as she had some steps she had  to work on at home.  Rehabilitation consult was obtained; however, patient  felt like she would be able to go home and use home therapy without  difficulty and on the sixth postoperative day in improved condition, stating  that she wanted to go home, patient was discharged home for follow-up in the  office.   CONDITION ON DISCHARGE:  Improved.   DISCHARGE MEDICATIONS:  1. Vicodin one to two q.6h. p.r.n. pain.  2. Robaxin 500 one p.o. q.8h. p.r.n. spasm.  3. Coumadin per pharmacy protocol.  4. The patient had been on Avelox and we will leave that up to the medical     service to determine whether they wish her to go home on that.   DISCHARGE INSTRUCTIONS:  She is instructed to use her walker to weight bear  as tolerated, proceed with her therapy, and return to see [place] in 10 days at  the office or sooner p.r.n. problems.     Jaquelyn Bitter. Chabon, P.A.                   Erasmo Leventhal, M.D.    SJC/MEDQ  D:  10/19/2002  T:  10/19/2002  Job:  [id number]

## 2010-06-12 NOTE — Op Note (Signed)
NAME:  Lacey Stanton, Lacey Stanton NO.:  [id number]   MEDICAL RECORD NO.:  [id number]                   PATIENT TYPE:  INP   LOCATION:  0004                                 FACILITY:  [facility]   PHYSICIAN:  Erasmo Leventhal, M.D.         DATE OF BIRTH:  1926/06/03   DATE OF PROCEDURE:  09/21/2002  DATE OF DISCHARGE:                                 OPERATIVE REPORT   PREOPERATIVE DIAGNOSIS:  Left knee end-stage osteoarthritis.   POSTOPERATIVE DIAGNOSIS:  Left knee end-stage osteoarthritis.   PROCEDURE:  Left total knee arthroplasty.   SURGEON:  Erasmo Leventhal, M.D.   ASSISTANT:  Jaquelyn Bitter. Chabon, P.A.   ANESTHESIA:  Epidural.   ESTIMATED BLOOD LOSS:  Less than 100 mL.   TOURNIQUET TIME:  1 hour and 25 minutes, 350 mmHg.   COMPLICATIONS:  None.   DISPOSITION:  PACU stable.   OPERATIVE IMPLANTS:  Osteonics components, femur size 7, tibia size 7,  posterior stabilized patella 26 with an 8 mm tibial insert, posterior  stabilized flexed component.   DESCRIPTION OF PROCEDURE:  The patient was counseled in the holding area,  correct side was identified. IV had been started, antibiotics given and  epidural administered. The chart was signed appropriately. Taken to the  operating room, placed in supine position. Foley catheter placed utilizing  sterile technique by the OR circulating nurse. Preoperative antibiotics were  given in the holding area, left lower extremity was elevated, divided with  flexion contracture, flexed to 125 degrees. She was elevated, prepped with  Duraprep and all was draped in a sterile fashion. Exsanguinated with  Esmarch, tourniquet was inflated to 350 mmHg. A straight midline incision  was made through the skin and subcutaneous tissue, small vein was  electrocautery, medial and lateral soft tissue flaps were developed at the  appropriate level. Medial parapatellar arthrotomy was performed, patella was  everted. Medial  soft tissue release was done secondary to the varus portion  of the knee. The patella was everted, the knee was then flexed. End-stage  osteoarthritic changes, bone against bone contact, cruciate ligaments were  resected. Starting hole made in the distal femur, canal was irrigated,  effluent was clear. An intermedullary rod was gently placed and shows a 5  degree valgus. Chose to take a 10 mm cut off the distal femur. The distal  femur was cut, found to be a size 7. Rotations marks were made, distal femur  was cut to fit a size #7. Tibial osteophytes removed, tibial eminence was  resected, medial and lateral menisci removed, geniculate vessels coagulated  throughout the entire procedure. The posterior neurovascular structure was  meticulously protected throughout the entire procedure. The proximal tibia  was found to be a size 7, starting hole was made, _________ utilized, canal  was irrigated, effluent was clear. The intermedullary rod was gently placed.  I chose a zero degree slope, took a 10 mm cut  based upon the lateral side  which gave nice 2-3 mm resection defect on the medial side. Posteromedial  femoral osteophytes removed under direct visualization. Posterolaterally  there were no osteophytes there. Femoral troch was prepared in standard  fashion. At this time with a 7 femur, 7 tibia, 10 mm insert, we had good  range of motion, soft tissue balance, flexion, extension, excellent  tracking, rotation marks were made and the delta keel performed in standard  fashion. The patella was found to be a size 26 with range of depth of 10 mm,  locking holes were made and excess bone was removed. At this point in time,  the knee was irrigated with pulsatile lavage copiously. Geniculate vessels  had been coagulated.   Utilizing modern cement technique, all quadrants were cemented in place,  size 7 tibia, size 7 femur and a 26 patella. At this time, we did trials  with 8 and 10 mm polyethylene  insert, posterior stabilized flex. We had  excellent range of motion, flexion, well balance in flexion and extension,  patellofemoral tracking was anatomic.   Tibial trial was then removed and the final 8 mm thick flex type posterior  stabilized insert was applied. We had excellent range of motion and soft  tissue balance at this point in time. Meticulous hemostasis had been  obtained. Bone wax was placed and exposed bony surfaces. Two medium Hemovac  drains were placed. The knee was then closed sequentially, irrigated with  antibiotic solution during the closure. Arthrotomy Vicryl, subcu Vicryl,  skin closed with subcuticular Monocryl suture, Steri-Strips were applied.  Xeroform placed around the drains. A compressive wrap was applied to the  knee, tourniquet was deflated, given another g of Ancef at the end of the  case. Drainage was appropriate, excellent circulation in the foot and ankle  at the end of the case. The popliteal fossa was nice and soft, circulation  was intact. The patient did well at the times of the procedure. Sponge and  needle counts correct. No complications.                                               Erasmo Leventhal, M.D.    RAC/MEDQ  D:  09/21/2002  T:  09/22/2002  Job:  [id number]

## 2010-06-15 ENCOUNTER — Encounter: Payer: Self-pay | Admitting: *Deleted

## 2010-06-15 ENCOUNTER — Ambulatory Visit (INDEPENDENT_AMBULATORY_CARE_PROVIDER_SITE_OTHER): Payer: Medicare Other | Admitting: Adult Health

## 2010-06-15 ENCOUNTER — Encounter: Payer: Self-pay | Admitting: Adult Health

## 2010-06-15 DIAGNOSIS — J479 Bronchiectasis, uncomplicated: Secondary | ICD-10-CM

## 2010-06-15 DIAGNOSIS — J189 Pneumonia, unspecified organism: Secondary | ICD-10-CM | POA: Insufficient documentation

## 2010-06-15 NOTE — Assessment & Plan Note (Signed)
Recent dx of PNA w/ admission to [facility].  Clinically improving   Plan:  Continue on current regimen.  Mucinex DM Twice daily  As needed  Cough/congestion  Fluids and rest  Advance activity as tolerated.  Follow up Dr. Sherene Sires  In 2-3 weeks with chest xray .  Please contact office for sooner follow up if symptoms do not improve or worsen or seek emergency care

## 2010-06-15 NOTE — Progress Notes (Signed)
Letter for reimbursement of airline tickets typed in Epic, reviewed and approved by TP.  Letter printed, stamped and mailed to pt's verified home address per pt's request.

## 2010-06-15 NOTE — Progress Notes (Signed)
Subjective:    Patient ID: Lacey Stanton, female    DOB: 1926/08/06, 77 y.o.   MRN: [id number]  HPI  30  yowf never smoker carries dx of bronchiectasis since the 80's on xopenex daily with "good control" then switched over to brovana and not doing as well since then.   April 15, 2009 sp hospitalization at [facility] with pna 2/18 - 20/11 and much better in am after brovana at 8 am but by 2 pm feels she needs something else so using various other bronchodilators. rec add budesonide to brovana and treat like asthma sinc needing so much albuterol.  May 14, 2009 4 wk followup. Pt states that her breathing has improved and cough has resolved. rec no change   ov for surgical clearance for R TKR.  Sleeps on 02 every night no am exac walking around [place] do get short of breath walking to curb but does not need to stop  06/15/10 [facility] follow up  Pt presents for post hospital visit. She was admitted last week to [facility] for PNA and discharged on sat. Unfortunately we do not have records at the time of dictation. She says she has finished the abx completely. She is feeling better but still has low energy.  Cough and congestion are better. No fever or hemoptysis. Appetite is slowly returning.    Past Medical History:  Bronchiectasis  - CT 11/07/08: Bronchiectasis in the lingular > rml plus small HH  - Sinus CT neg 04/17/09  - HFA 25 > 75% effective April 15, 2009  - Spirometry  FEV1  0.97(53%) ratio 55%  Pneumonia  Allergic Rhinitis  Pulmonary hemorrhage 1979  Osteoarthritis      - s/p  L TKR 02/2000 >>  Difficult recovery Hypothyroidism  GERD      Review of Systems Constitutional:   No  weight loss, night sweats,  Fevers, chills, +fatigue, or  lassitude.  HEENT:   No headaches,  Difficulty swallowing,  Tooth/dental problems, or  Sore throat,                No sneezing, itching, ear ache, nasal congestion, post nasal drip,   CV:  No chest pain,  Orthopnea, PND,  swelling in lower extremities, anasarca, dizziness, palpitations, syncope.   GI  No heartburn, indigestion, abdominal pain, nausea, vomiting, diarrhea, change in bowel habits, loss of appetite, bloody stools.   Resp:   No excess mucus, no productive cough,  No non-productive cough,  No coughing up of blood.  No change in color of mucus.  No wheezing.  No chest wall deformity  Skin: no rash or lesions.  GU: no dysuria, change in color of urine, no urgency or frequency.  No flank pain, no hematuria   MS:  No joint pain or swelling.  No decreased range of motion.     Psych:  No change in mood or affect. No depression or anxiety.  No memory loss.          Objective:   Physical Exam Edentulous pleasant wf nad  GEN: A/Ox3; pleasant , NAD, elderly.  HEENT:  Beatrice/AT,  EACs-clear, TMs-wnl, NOSE-clear, THROAT-clear, no lesions, no postnasal drip or exudate noted.   NECK:  Supple w/ fair ROM; no JVD; normal carotid impulses w/o bruits; no thyromegaly or nodules palpated; no lymphadenopathy.  RESP  Coarse BS w/ no  wheezes/ rales/ or rhonchi.no accessory muscle use, no dullness to percussion  CARD:  RRR, no m/r/g  , no  peripheral edema, pulses intact, no cyanosis or clubbing.  GI:   Soft & nt; nml bowel sounds; no organomegaly or masses detected.  Musco: Warm bil, no deformities or joint swelling noted.   Neuro: alert, no focal deficits noted.    Skin: Warm, no lesions or rashes           Assessment & Plan:

## 2010-06-15 NOTE — Patient Instructions (Signed)
Continue on current regimen.  Mucinex DM Twice daily  As needed  Cough/congestion  Fluids and rest  Advance activity as tolerated.  Follow up Dr. Sherene Sires  In 2-3 weeks with chest xray .  Please contact office for sooner follow up if symptoms do not improve or worsen or seek emergency care

## 2010-07-01 ENCOUNTER — Ambulatory Visit (INDEPENDENT_AMBULATORY_CARE_PROVIDER_SITE_OTHER)
Admission: RE | Admit: 2010-07-01 | Discharge: 2010-07-01 | Disposition: A | Discharge status: Home / Self Care | Payer: Medicare Other | Source: Ambulatory Visit | Attending: Internal Medicine | Admitting: Internal Medicine

## 2010-07-01 ENCOUNTER — Encounter: Payer: Self-pay | Admitting: Internal Medicine

## 2010-07-01 ENCOUNTER — Ambulatory Visit (INDEPENDENT_AMBULATORY_CARE_PROVIDER_SITE_OTHER): Payer: Medicare Other | Admitting: Internal Medicine

## 2010-07-01 VITALS — BP 122/68 | HR 72 | Temp 97.9°F | Ht 63.0 in | Wt 126.0 lb

## 2010-07-01 DIAGNOSIS — J449 Chronic obstructive pulmonary disease, unspecified: Secondary | ICD-10-CM

## 2010-07-01 DIAGNOSIS — J189 Pneumonia, unspecified organism: Secondary | ICD-10-CM

## 2010-07-01 DIAGNOSIS — J479 Bronchiectasis, uncomplicated: Secondary | ICD-10-CM

## 2010-07-01 MED ORDER — MOXIFLOXACIN HCL 400 MG PO TABS: 400.0000 mg | ORAL_TABLET | Freq: Every day | ORAL | Status: AC

## 2010-07-01 NOTE — Patient Instructions (Addendum)
When mucus turns nasty go ahead and take avelox 400 mg x 5 days and if not 100% better we need to see you   GERD (REFLUX)  is an extremely common cause of respiratory symptoms, many times with no significant heartburn at all.    It can be treated with medication, but also with lifestyle changes including avoidance of late meals, excessive alcohol, smoking cessation, and avoid fatty foods, chocolate, peppermint, colas, red wine, and acidic juices such as orange juice.  NO MINT OR MENTHOL PRODUCTS SO NO COUGH DROPS  USE SUGARLESS CANDY INSTEAD (jolley ranchers or Stover's)  NO OIL BASED VITAMINS (no fish oil)   If you are satisfied with your treatment plan let your doctor know and he/she can either refill your medications or you can return here when your prescription runs out.     If in any way you are not 100% satisfied,  please tell us.  If 100% better, tell your friends!

## 2010-07-01 NOTE — Assessment & Plan Note (Signed)
Ok to use Avelox x 5 day cycles

## 2010-07-01 NOTE — Assessment & Plan Note (Signed)
Cycles of avelox x 5days and continue max rx for airways dz.     Each maintenance medication was reviewed in detail including most importantly the difference between maintenance and as needed and under what circumstances the prns are to be used.  Please see instructions for details which were reviewed in writing and the patient given a copy.  See the written copy of this report in the patient's paper medical record.  These results did not interface directly into the electronic medical record and are summarized here.

## 2010-07-01 NOTE — Progress Notes (Signed)
Subjective:    Patient ID: Lacey Stanton, female    DOB: 07-May-1926, 77 y.o.   MRN: [id number]  HPI  86  yowf never smoker carries dx of bronchiectasis since the 80's on xopenex daily with "good control" then switched over to brovana and not doing as well since then.   April 15, 2009 sp hospitalization at [facility] with pna 2/18 - 20/11 and much better in am after brovana at 8 am but by 2 pm feels she needs something else so using various other bronchodilators. rec add budesonide to brovana and treat like asthma sinc needing so much albuterol.  May 14, 2009 4 wk followup. Pt states that her breathing has improved and cough has resolved. rec no change   04/21/2010 ov for surgical clearance for R TKR.  Sleeps on 02 every night no am exac walking around [place] do get short of breath walking to curb but does not need to stop rec You have moderately severe airflow obstruction and are at increased risk of pulmonary complications post operatively.  If your knee gets worse to where is limiting your desired activity you should have surgery where you a have pulmonary doctor who can see you for any complications that arise  Quinine or tonic water should help with cramps if not try ginkabiloba but these are not regulated by the FDA since they are food additives  May 23 2010 [facility] with pna > rehab x 2 weeks   06/15/10 NP/ ov p admit  to [facility] for PNA and discharged  p rehab x 2 weeks. has finished the abx completely. She is feeling better but still has low energy.  Cough and congestion are better. No fever or hemoptysis. Appetite is slowly returning.   07/01/2010 ov/Arlester Keehan doing ok on neb bud/brovana bid and rare need for xopenex.  No purulent sputum.   diabetic educator       Past Medical History:  Bronchiectasis  - CT 11/07/08: Bronchiectasis in the lingular > rml plus small HH  - Sinus CT neg 04/17/09  - HFA 25 > 75% effective April 15, 2009  - Spirometry  FEV1   0.97(53%) ratio 55%  Pneumonia > cleared radiographically 07/01/2010     - Pneumovax March 2011  Allergic Rhinitis  Pulmonary hemorrhage 1979   Osteoarthritis      - s/p  L TKR 02/2000 >>  Difficult recovery Hypothyroidism  GERD                Objective:   Physical Exam Edentulous pleasant wf nad  GEN: A/Ox3; pleasant , NAD, elderly.  HEENT:  [place]/AT,  EACs-clear, TMs-wnl, NOSE-clear, THROAT-clear, no lesions, no postnasal drip or exudate noted.   NECK:  Supple w/ fair ROM; no JVD; normal carotid impulses w/o bruits; no thyromegaly or nodules palpated; no lymphadenopathy.  RESP  no accessory muscle use, no dullness to percussion.  CARD:  RRR, no m/r/g  , no peripheral edema, pulses intact, no cyanosis or clubbing.  GI:   Soft & nt; nml bowel sounds; no organomegaly or masses detected.  Musco: Warm bil, no deformities or joint swelling noted.   Neuro: alert, no focal deficits noted.    Skin: Warm, no lesions or rashes        cxr 07/01/2010 Comparison: Chest x-ray of 04/21/2010  Findings: No active infiltrate or effusion is seen. Minimal peribronchial thickening is noted. Linear scarring or atelectasis is noted medially at the right lung base. The heart is within  normal limits in size. Surgical clips overlie the right axilla. No acute bony abnormality is seen.  IMPRESSION: No active lung disease.     Assessment & Plan:

## 2010-07-10 ENCOUNTER — Telehealth: Payer: Self-pay | Admitting: Internal Medicine

## 2010-07-10 DIAGNOSIS — J449 Chronic obstructive pulmonary disease, unspecified: Secondary | ICD-10-CM

## 2010-07-13 NOTE — Telephone Encounter (Signed)
Order for rehab referral was sent to [facility].

## 2010-07-13 NOTE — Telephone Encounter (Signed)
Spoke with Lacey Stanton at rehab and [facility] and she states the pt called and is requesting to attend their program. They are needing an order from Dr. Sherene Sires for this. Please advise if ok to send order for pulmonary rehab? Carron Curie, CMA

## 2010-07-13 NOTE — Telephone Encounter (Signed)
Ok for rehab referral

## 2010-07-15 ENCOUNTER — Encounter: Payer: Self-pay | Admitting: Internal Medicine

## 2010-07-15 ENCOUNTER — Telehealth: Payer: Self-pay | Admitting: Internal Medicine

## 2010-07-15 NOTE — Telephone Encounter (Signed)
Spoke with pt and notified of Alpha 1 test was neg. Pt verbalized understanding.

## 2010-07-23 ENCOUNTER — Telehealth: Payer: Self-pay | Admitting: Internal Medicine

## 2010-07-23 NOTE — Telephone Encounter (Signed)
LMOVM for Lorraine TCB.

## 2010-07-23 NOTE — Telephone Encounter (Signed)
Duplicate encounter

## 2010-07-24 NOTE — Telephone Encounter (Signed)
Spoke with [place] and she states they received the signed RX, nothing further needed. Carron Curie, CMA

## 2010-07-28 ENCOUNTER — Encounter: Payer: Self-pay | Admitting: Internal Medicine

## 2010-07-31 ENCOUNTER — Telehealth: Payer: Self-pay | Admitting: Internal Medicine

## 2010-07-31 NOTE — Telephone Encounter (Signed)
Called, spoke with pt.  States she is taking brovana and budesonide at 8:30am and 8:30 pm everyday.  States around 6:30pm everyday she is having SOB and feels the treatments are running out at this time.  She states her o2 sat remains 90% on RA when this happens.  She states this has been going on since May 19.  She does have rescue inhaler but states she does not use it during this time bc MW does not want her using it everyday - only in emergencies.  She is requesting OV to discuss this with MW and see what his recs are.  OV scheduled for Monday, July 9 at 10am -- pt ok with this.

## 2010-08-03 ENCOUNTER — Encounter: Payer: Self-pay | Admitting: Internal Medicine

## 2010-08-03 ENCOUNTER — Ambulatory Visit (INDEPENDENT_AMBULATORY_CARE_PROVIDER_SITE_OTHER): Payer: Medicare Other | Admitting: Internal Medicine

## 2010-08-03 VITALS — BP 116/70 | HR 75 | Temp 98.0°F | Ht 63.0 in | Wt 125.2 lb

## 2010-08-03 DIAGNOSIS — J449 Chronic obstructive pulmonary disease, unspecified: Secondary | ICD-10-CM

## 2010-08-03 DIAGNOSIS — J479 Bronchiectasis, uncomplicated: Secondary | ICD-10-CM

## 2010-08-03 DIAGNOSIS — J4489 Other specified chronic obstructive pulmonary disease: Secondary | ICD-10-CM

## 2010-08-03 MED ORDER — LEVALBUTEROL TARTRATE 45 MCG/ACT IN AERO: 1.0000 | INHALATION_SPRAY | RESPIRATORY_TRACT | Status: DC | PRN

## 2010-08-03 NOTE — Patient Instructions (Signed)
When mucus turns nasty and stays  go ahead and take avelox 400 mg x 5 days and if not 100% better we need to see you   Ok to use pm nebulizer a little bit earlier, it doesn't have to be exactly 12 hours  Continue to use the flutter and mucinex as needed   GERD (REFLUX)  is an extremely common cause of respiratory symptoms, many times with no significant heartburn at all.    It can be treated with medication, but also with lifestyle changes including avoidance of late meals, excessive alcohol, smoking cessation, and avoid fatty foods, chocolate, peppermint, colas, red wine, and acidic juices such as orange juice.  NO MINT OR MENTHOL PRODUCTS SO NO COUGH DROPS  USE SUGARLESS CANDY INSTEAD (jolley ranchers or Stover's)  NO OIL BASED VITAMINS (no fish oil)  Please schedule a follow up office visit in 4 weeks, sooner if needed with PFT's

## 2010-08-03 NOTE — Progress Notes (Signed)
Subjective:    Patient ID: Lacey Stanton, female    DOB: 07-26-1926, 77 y.o.   MRN: [id number]  HPI  66  yowf never smoker carries dx of bronchiectasis since the 80's  April 15, 2009 sp hospitalization at [facility] with pna 2/18 - 20/11 and much better in am after brovana at 8 am but by 2 pm feels she needs something else so using various other bronchodilators. rec add budesonide to brovana and treat like asthma sinc needing so much albuterol.   04/21/2010 ov for surgical clearance for R TKR.  Sleeps on 02 every night no am exac walking around [place] do get short of breath walking to curb but does not need to stop rec You have moderately severe airflow obstruction and are at increased risk of pulmonary complications post operatively.  If your knee gets worse to where is limiting your desired activity you should have surgery where you a have pulmonary doctor who can see you for any complications that arise  Quinine or tonic water should help with cramps if not try ginkabiloba but these are not regulated by the FDA since they are food additives   07/01/2010 ov/Lacey Stanton doing ok on neb bud/brovana bid and rare need for xopenex.  No purulent sputum.  rec When mucus turns nasty go ahead and take avelox 400 mg x 5 days and if not 100% better we need to see you   GERD (REFLUX)  Diet  08/03/2010 ov/Lacey Stanton cc ok until 630 pm each night cc sob better p uses evening neb but not until then.  Sleeping ok without nocturnal  or early am exac of resp c/o's or need for noct saba. Good activity tolerance until late in the day.  Has not tried taking neb a little earlier nor using xopenex  Pt denies any significant sore throat, dysphagia, itching, sneezing,  nasal congestion or excess/ purulent secretions,  fever, chills, sweats, unintended wt loss, pleuritic or exertional cp, hempoptysis, orthopnea pnd or leg swelling.    Also denies any obvious fluctuation of symptoms with weather or environmental changes or other  aggravating or alleviating factors.           Past Medical History:  Bronchiectasis  - CT 11/07/08: Bronchiectasis in the lingular > rml plus small HH  - Sinus CT neg 04/17/09  - HFA 25 > 75% effective April 15, 2009  - Spirometry  FEV1  0.97(53%) ratio 55%  Pneumonia > cleared radiographically 07/01/2010     - Pneumovax March 2011  Allergic Rhinitis  Pulmonary hemorrhage 1979   Osteoarthritis      - s/p  L TKR 02/2000 >>  Difficult recovery Hypothyroidism  GERD                Objective:   Physical Exam  Edentulous pleasant wf nad  Wt 125 08/03/2010  GEN: A/Ox3; pleasant , NAD, elderly.  HEENT:  [place]/AT,  EACs-clear, TMs-wnl, NOSE-clear, THROAT-clear, no lesions, no postnasal drip or exudate noted.   NECK:  Supple w/ fair ROM; no JVD; normal carotid impulses w/o bruits; no thyromegaly or nodules palpated; no lymphadenopathy.  RESP  no accessory muscle use, no dullness to percussion.  CARD:  RRR, no m/r/g  , no peripheral edema, pulses intact, no cyanosis or clubbing.  GI:   Soft & nt; nml bowel sounds; no organomegaly or masses detected.  Musc:  Warm bil, no deformities or joint swelling noted.  cxr 07/01/2010 Comparison: Chest x-ray of 04/21/2010  Findings: No active infiltrate or effusion is seen. Minimal peribronchial thickening is noted. Linear scarring or atelectasis is noted medially at the right lung base. The heart is within normal limits in size. Surgical clips overlie the right axilla. No acute bony abnormality is seen.  IMPRESSION: No active lung disease.     Assessment & Plan:

## 2010-08-03 NOTE — Assessment & Plan Note (Addendum)
Dependency on Brovana strongly suggests airway component  DDX of  difficult airways managment all start with A and  include Adherence, Ace Inhibitors, Acid Reflux, Active Sinus Disease, Alpha 1 Antitripsin deficiency, Anxiety masquerading as Airways dz,  ABPA,  allergy(esp in young), Aspiration (esp in elderly), Adverse effects of DPI,  Active smokers,   Adherence is always the initial "prime suspect" and is a multilayered concern that requires a "trust but verify" approach in every patient - starting with knowing how to use medications, especially inhalers, correctly, keeping up with refills and understanding the fundamental difference between maintenance and prns vs those medications only taken for a very short course and then stopped and not refilled.  See instructions for specific recommendations which were reviewed directly with the patient who was given a copy with highlighter outlining the key components.

## 2010-08-31 ENCOUNTER — Ambulatory Visit: Payer: Medicare Other | Admitting: Internal Medicine

## 2010-09-16 ENCOUNTER — Ambulatory Visit (INDEPENDENT_AMBULATORY_CARE_PROVIDER_SITE_OTHER): Payer: Medicare Other | Admitting: Internal Medicine

## 2010-09-16 ENCOUNTER — Encounter: Payer: Self-pay | Admitting: Internal Medicine

## 2010-09-16 VITALS — BP 118/64 | HR 78 | Temp 97.2°F | Ht 63.0 in | Wt 122.0 lb

## 2010-09-16 DIAGNOSIS — J4489 Other specified chronic obstructive pulmonary disease: Secondary | ICD-10-CM

## 2010-09-16 DIAGNOSIS — J479 Bronchiectasis, uncomplicated: Secondary | ICD-10-CM

## 2010-09-16 DIAGNOSIS — J449 Chronic obstructive pulmonary disease, unspecified: Secondary | ICD-10-CM

## 2010-09-16 LAB — PULMONARY FUNCTION TEST

## 2010-09-16 NOTE — Progress Notes (Signed)
PFT done today. 

## 2010-09-16 NOTE — Patient Instructions (Signed)
Paced exercise 30 minutes daily  Please schedule a follow up visit in 6 months but call sooner if needed

## 2010-09-16 NOTE — Assessment & Plan Note (Signed)
Bronchiectasis with moderate airflow obstruction with asthmatic component and no evidence of aspiration, underlying collagen vascular dz or hematologic/immunologic def or alpha one def so rx with nebs as planned seems to be working fine.   I had an extended discussion with the patient today lasting 15 to 20 minutes of a 25 minute visit on the following issues:     Each maintenance medication was reviewed in detail including most importantly the difference between maintenance and as needed and under what circumstances the prns are to be used.  Please see instructions for details which were reviewed in writing and the patient given a copy.    Discussed pacing also to regain conditioning.

## 2010-09-16 NOTE — Progress Notes (Signed)
Subjective:    Patient ID: Lacey Stanton, female    DOB: 10-11-1926, 77 y.o.   MRN: [id number]  HPI  48  yowf never smoker carries dx of bronchiectasis since the 80's  April 15, 2009 sp hospitalization at [facility] with pna 2/18 - 20/11 and much better in am after brovana at 8 am but by 2 pm feels she needs something else so using various other bronchodilators. rec add budesonide to brovana and treat like asthma sinc needing so much albuterol.   04/21/2010 ov for surgical clearance for R TKR.  Sleeps on 02 every night no am exac walking around [place] do get short of breath walking to curb but does not need to stop rec You have moderately severe airflow obstruction and are at increased risk of pulmonary complications post operatively.  If your knee gets worse to where is limiting your desired activity you should have surgery where you a have pulmonary doctor who can see you for any complications that arise  Quinine or tonic water should help with cramps if not try ginkabiloba but these are not regulated by the FDA since they are food additives   07/01/2010 ov/Lacey Stanton doing ok on neb bud/brovana bid and rare need for xopenex.  No purulent sputum.  rec When mucus turns nasty go ahead and take avelox 400 mg x 5 days and if not 100% better we need to see you   GERD (REFLUX)  Diet  08/03/2010 ov/Lacey Stanton cc ok until 630 pm each night cc sob better p uses evening neb but not until then.  Sleeping ok without nocturnal  or early am exac of resp c/o's or need for noct saba. Good activity tolerance until late in the day.  Has not tried taking neb a little earlier nor using xopenex rec When mucus turns nasty and stays  go ahead and take avelox 400 mg x 5 days and if not 100% better we need to see you   Ok to use pm nebulizer a little bit earlier, it doesn't have to be exactly 12 hours  Continue to use the flutter and mucinex as needed   GERD (REFLUX) diet  09/16/2010 f/u ov/Lacey Stanton cc doe walking to  neighbor's townhouse and back,  Exercise at gym including 10 min of walking flat moderate pace.   Wants to take on water aerobics next.  No longer using saba daytime. Sleeping ok without nocturnal  or early am exac of resp c/o's or need for noct saba.  Pt denies any significant sore throat, dysphagia, itching, sneezing,  nasal congestion or excess/ purulent secretions,  fever, chills, sweats, unintended wt loss, pleuritic or exertional cp, hempoptysis, orthopnea pnd or leg swelling.  Also denies presyncope, palpitations, heartburn, abdominal pain, nausea, vomiting, diarrhea  or change in bowel or urinary habits, dysuria,hematuria,  rash, arthralgias, visual complaints, headache, numbness weakness or ataxia.  Also denies any obvious fluctuation of symptoms with weather or environmental changes or other aggravating or alleviating factors.          Past Medical History:  Bronchiectasis  - CT 11/07/08: Bronchiectasis in the lingular > rml plus small HH  - Sinus CT neg 04/17/09  - HFA 25 > 75% effective April 15, 2009  -PFT's 09/16/2010   FEV1  .89( 55%)  Ratio 49  No better with B2,  DLCO 62 > improved to 107 Pneumonia > cleared radiographically 07/01/2010     - Pneumovax March 2011  Allergic Rhinitis  Pulmonary hemorrhage 1979  Osteoarthritis      - s/p  L TKR 02/2000 >>  Difficult recovery Hypothyroidism  GERD                Objective:   Physical Exam  Edentulous pleasant amb wf nad  Wt 125 08/03/2010  > 122 09/16/2010   GEN: A/Ox3; pleasant , NAD, elderly .  HEENT:  [place]/AT,  EACs-clear, TMs-wnl, NOSE-clear, THROAT-clear, no lesions, no postnasal drip or exudate noted.   NECK:  Supple w/ fair ROM; no JVD; normal carotid impulses w/o bruits; no thyromegaly or nodules palpated; no lymphadenopathy.  RESP  no accessory muscle use, no dullness to percussion.  CARD:  RRR, no m/r/g  , no peripheral edema, pulses intact, no cyanosis or clubbing.  GI:   Soft & nt; nml bowel sounds;  no organomegaly or masses detected.  Musc:  Warm bil, no deformities or joint swelling noted.            cxr 07/01/2010 Comparison: Chest x-ray of 04/21/2010  Findings: No active infiltrate or effusion is seen. Minimal peribronchial thickening is noted. Linear scarring or atelectasis is noted medially at the right lung base. The heart is within normal limits in size. Surgical clips overlie the right axilla. No acute bony abnormality is seen.  IMPRESSION: No active lung disease.     Assessment & Plan:

## 2010-12-09 ENCOUNTER — Ambulatory Visit (INDEPENDENT_AMBULATORY_CARE_PROVIDER_SITE_OTHER): Payer: Medicare Other | Admitting: Internal Medicine

## 2010-12-09 ENCOUNTER — Encounter: Payer: Self-pay | Admitting: Internal Medicine

## 2010-12-09 VITALS — BP 120/62 | HR 63 | Temp 98.5°F | Ht 63.0 in | Wt 128.4 lb

## 2010-12-09 DIAGNOSIS — J479 Bronchiectasis, uncomplicated: Secondary | ICD-10-CM

## 2010-12-09 MED ORDER — LEVOFLOXACIN 750 MG PO TABS: 750.0000 mg | ORAL_TABLET | Freq: Every day | ORAL | Status: AC

## 2010-12-09 NOTE — Progress Notes (Signed)
Subjective:    Patient ID: Lacey Stanton, female    DOB: 1926/10/13, 77 y.o.   MRN: [id number]  HPI  61  yowf never smoker carries dx of bronchiectasis since the 80's  April 15, 2009 sp hospitalization at [facility] with pna 2/18 - 20/11 and much better in am after brovana at 8 am but by 2 pm feels she needs something else so using various other bronchodilators. rec add budesonide to brovana and treat like asthma sinc needing so much albuterol.   04/21/2010 ov for surgical clearance for R TKR.  Sleeps on 02 every night no am exac walking around [place] do get short of breath walking to curb but does not need to stop rec You have moderately severe airflow obstruction and are at increased risk of pulmonary complications post operatively.  If your knee gets worse to where is limiting your desired activity you should have surgery where you a have pulmonary doctor who can see you for any complications that arise  Quinine or tonic water should help with cramps if not try ginkabiloba but these are not regulated by the FDA since they are food additives  07/01/2010 ov/Gerren Hoffmeier doing ok on neb bud/brovana bid and rare need for xopenex.  No purulent sputum.  rec When mucus turns nasty go ahead and take avelox 400 mg x 5 days and if not 100% better we need to see you  GERD (REFLUX)  Diet  08/03/2010 ov/Pamelia Botto cc ok until 630 pm each night cc sob better p uses evening neb but not until then.  Sleeping ok without nocturnal  or early am exac of resp c/o's or need for noct saba. Good activity tolerance until late in the day.  Has not tried taking neb a little earlier nor using xopenex rec When mucus turns nasty and stays  go ahead and take avelox 400 mg x 5 days and if not 100% better we need to see you  Ok to use pm nebulizer a little bit earlier, it doesn't have to be exactly 12 hours Continue to use the flutter and mucinex as needed  GERD (REFLUX) diet  09/16/2010 f/u ov/Asaiah Scarber cc doe walking to neighbor's  townhouse and back,  Exercise at gym including 10 min of walking flat moderate pace.   Wants to take on water aerobics next.      12/09/2010 f/u ov/Blu Lori cc ok until 11/10 acute onset nasal congestion/ cough> yellow phlegm and low grade fever so started avelox x 5 day course and much better on day of ov, symptoms peaked 11/12-13. Last took avelox months ago.  Pt denies any significant sore throat, dysphagia, itching, sneezing,  nasal congestion or excess/ purulent secretions,  fever, chills, sweats, unintended wt loss, pleuritic or exertional cp, hempoptysis, orthopnea pnd or leg swelling.  Also denies presyncope, palpitations, heartburn, abdominal pain, nausea, vomiting, diarrhea  or change in bowel or urinary habits, dysuria,hematuria,  rash, arthralgias, visual complaints, headache, numbness weakness or ataxia.           Past Medical History:  Bronchiectasis  - CT 11/07/08: Bronchiectasis in the lingular > rml plus small HH  - Sinus CT neg 04/17/09  - HFA 25 > 75% effective April 15, 2009  -PFT's 09/16/2010   FEV1  .89( 55%)  Ratio 49  No better with B2,  DLCO 62 > improved to 107 Pneumonia > cleared radiographically 07/01/2010     - Pneumovax March 2011  Allergic Rhinitis  Pulmonary hemorrhage 1979   Osteoarthritis      -  s/p  L TKR 02/2000 >>  Difficult recovery Hypothyroidism  GERD                Objective:   Physical Exam  Edentulous pleasant amb wf nad  Wt 125 08/03/2010  > 122 09/16/2010  > 128 12/09/2010   GEN: A/Ox3; pleasant , NAD, elderly .  HEENT:  [place]/AT,  EACs-clear, TMs-wnl, NOSE-clear, THROAT-clear, no lesions, no postnasal drip or exudate noted.   NECK:  Supple w/ fair ROM; no JVD; normal carotid impulses w/o bruits; no thyromegaly or nodules palpated; no lymphadenopathy.  RESP  no accessory muscle use, no dullness to percussion.  CARD:  RRR, no m/r/g  , no peripheral edema, pulses intact, no cyanosis or clubbing.  GI:   Soft & nt; nml bowel sounds;  no organomegaly or masses detected.  Musc:  Warm bil, no deformities or joint swelling noted.            cxr 07/01/2010 Comparison: Chest x-ray of 04/21/2010  Findings: No active infiltrate or effusion is seen. Minimal peribronchial thickening is noted. Linear scarring or atelectasis is noted medially at the right lung base. The heart is within normal limits in size. Surgical clips overlie the right axilla. No acute bony abnormality is seen.  IMPRESSION: No active lung disease.     Assessment & Plan:

## 2010-12-09 NOTE — Patient Instructions (Addendum)
Try levaquin 750 x one daily x 5 days for next flare   If you are satisfied with your treatment plan let your doctor know and he/she can either refill your medications or you can return here when your prescription runs out.     If in any way you are not 100% satisfied,  please tell us.  If 100% better, tell your friends!

## 2010-12-10 NOTE — Assessment & Plan Note (Signed)
-   CT 11/07/08: Bronchiectasis in the lingular > rml plus small HH  - Sinus CT neg 04/17/09  - HFA   75%  12/09/2010  - -PFT's 09/16/2010   FEV1  .89( 55%)  Ratio 49  No better with B2,  DLCO 62 > improved to 107 - Alpha one AT genotype 07/07/10: MM  DDX of  difficult airways managment all start with A and  include Adherence, Ace Inhibitors, Acid Reflux, Active Sinus Disease, Alpha 1 Antitripsin deficiency, Anxiety masquerading as Airways dz,  ABPA,  allergy(esp in young), Aspiration (esp in elderly), Adverse effects of DPI,  Active smokers, plus two Bs  = Bronchiectasis and Beta blocker use..and one C= CHF     Adherence is always the initial "prime suspect" and is a multilayered concern that requires a "trust but verify" approach in every patient - starting with knowing how to use medications, especially inhalers, correctly, keeping up with refills and understanding the fundamental difference between maintenance and prns vs those medications only taken for a very short course and then stopped and not refilled. The proper method of use, as well as anticipated side effects, of this metered-dose inhaler are discussed and demonstrated to the patient. Improved to 75% with repeated coaching  Unfortunately she also has one of the two B's, bronchiectasis, but is doing reasonably well

## 2011-03-15 ENCOUNTER — Ambulatory Visit (INDEPENDENT_AMBULATORY_CARE_PROVIDER_SITE_OTHER)
Admission: RE | Admit: 2011-03-15 | Discharge: 2011-03-15 | Disposition: A | Discharge status: Home / Self Care | Payer: Medicare Other | Source: Ambulatory Visit | Attending: Internal Medicine | Admitting: Internal Medicine

## 2011-03-15 ENCOUNTER — Ambulatory Visit (INDEPENDENT_AMBULATORY_CARE_PROVIDER_SITE_OTHER): Payer: Medicare Other | Admitting: Internal Medicine

## 2011-03-15 ENCOUNTER — Encounter: Payer: Self-pay | Admitting: Internal Medicine

## 2011-03-15 VITALS — BP 126/64 | HR 70 | Temp 97.7°F | Ht 63.0 in | Wt 133.2 lb

## 2011-03-15 DIAGNOSIS — J479 Bronchiectasis, uncomplicated: Secondary | ICD-10-CM

## 2011-03-15 NOTE — Patient Instructions (Signed)
Yearly cxr's are reasonable to follow the problem and Dr Martha Clan can do these in the future and refer if needed  Please remember to go to the x-ray department downstairs for your tests - we will call you with the results when they are available.    Bronchiectasis =   you have scarring of your bronchial tubes which means that they don't function perfectly normally and mucus tends to pool in certain areas of your lung which can cause pneumonia and further scarring of your lung and bronchial tubes  Whenever you develop cough congestion take mucinex or mucinex dm > these will help keep the mucus loose and flowing maximum dose is 1200 mg every 12 hours  If you are satisfied with your treatment plan let your doctor know and he/she can either refill your medications or you can return here when your prescription runs out.     If in any way you are not 100% satisfied,  please tell us.  If 100% better, tell your friends!

## 2011-03-15 NOTE — Progress Notes (Signed)
Subjective:    Patient ID: Lacey Stanton, female    DOB: 07-31-1926   MRN: [id number]   Brief patient profile:  89  yowf never smoker carries dx of bronchiectasis since the 80's  April 15, 2009 sp hospitalization at [facility] with pna 2/18 - 20/11 and much better in am after brovana at 8 am but by 2 pm feels she needs something else so using various other bronchodilators. rec add budesonide to brovana and treat like asthma sinc needing so much albuterol.   04/21/2010 ov for surgical clearance for R TKR.  Sleeps on 02 every night no am exac walking around town[place] do get short of breath walking to curb but does not need to stop rec You have moderately severe airflow obstruction and are at increased risk of pulmonary complications post operatively.  If your knee gets worse to where is limiting your desired activity you should have surgery where you a have pulmonary doctor who can see you for any complications that arise  Quinine or tonic water should help with cramps if not try ginkabiloba but these are not regulated by the FDA since they are food additives  07/01/2010 ov/Wert doing ok on neb bud/brovana bid and rare need for xopenex.  No purulent sputum.  rec When mucus turns nasty go ahead and take avelox 400 mg x 5 days and if not 100% better we need to see you  GERD (REFLUX)  Diet  08/03/2010 ov/Wert cc ok until 630 pm each night cc sob better p uses evening neb but not until then.  Sleeping ok without nocturnal  or early am exac of resp c/o's or need for noct saba. Good activity tolerance until late in the day.  Has not tried taking neb a little earlier nor using xopenex rec When mucus turns nasty and stays  go ahead and take avelox 400 mg x 5 days and if not 100% better we need to see you  Ok to use pm nebulizer a little bit earlier, it doesn't have to be exactly 12 hours Continue to use the flutter and mucinex as needed  GERD (REFLUX) diet  09/16/2010 f/u ov/Wert cc doe walking to  neighbor's townhouse and back,  Exercise at gym including 10 min of walking flat moderate pace.   Wants to take on water aerobics next.      12/09/2010 f/u ov/Wert cc ok until 11/10 acute onset nasal congestion/ cough> yellow phlegm and low grade fever so started avelox x 5 day course and much better on day of ov, symptoms peaked 11/12-13. Last took avelox months ago. rec Try levaquin 750 x one daily x 5 days for next flare   03/15/2011 f/u ov/Wert cc cough resolved to her satisfaction, hasn't taken levaquin course recently. No limiting sob.  Sleeping ok without nocturnal  or early am exacerbation  of respiratory  c/o's or need for noct saba. Also denies any obvious fluctuation of symptoms with weather or environmental changes or other aggravating or alleviating factors except as outlined above   Pt denies any significant sore throat, dysphagia, itching, sneezing,  nasal congestion or excess/ purulent secretions,  fever, chills, sweats, unintended wt loss, pleuritic or exertional cp, hempoptysis, orthopnea pnd or leg swelling.  Also denies presyncope, palpitations, heartburn, abdominal pain, nausea, vomiting, diarrhea  or change in bowel or urinary habits, dysuria,hematuria,  rash, arthralgias, visual complaints, headache, numbness weakness or ataxia.           Past Medical History:  Bronchiectasis  -  CT 11/07/08: Bronchiectasis in the lingular > rml plus small HH  - Sinus CT neg 04/17/09  - HFA 25 > 75% effective April 15, 2009  -PFT's 09/16/2010   FEV1  .89( 55%)  Ratio 49  No better with B2,  DLCO 62 > improved to 107 Pneumonia > cleared radiographically 07/01/2010     - Pneumovax March 2011  Allergic Rhinitis  Pulmonary hemorrhage 1979   Osteoarthritis      - s/p  L TKR 02/2000 >>  Difficult recovery Hypothyroidism  GERD                Objective:   Physical Exam  Edentulous pleasant amb wf nad  Wt 125 08/03/2010  > 122 09/16/2010  > 128 12/09/2010 > 03/15/2011   133  GEN: A/Ox3; pleasant , NAD, elderly .  HEENT:  [place]/AT,  EACs-clear, TMs-wnl, NOSE-clear, THROAT-clear, no lesions, no postnasal drip or exudate noted.   NECK:  Supple w/ fair ROM; no JVD; normal carotid impulses w/o bruits; no thyromegaly or nodules palpated; no lymphadenopathy.  RESP  no accessory muscle use, no dullness to percussion.  CARD:  RRR, no m/r/g  , no peripheral edema, pulses intact, no cyanosis or clubbing.  GI:   Soft & nt; nml bowel sounds; no organomegaly or masses detected.  Musc:  Warm bil, no deformities or joint swelling noted.            CXR  03/15/2011 : Chronic bronchitic changes and linear scarring changes within the medial right lung base - unchanged. Old thoracolumbar vertebral body compression fracture - unchanged. No acute findings.       Assessment & Plan:

## 2011-03-15 NOTE — Assessment & Plan Note (Addendum)
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  Please see instructions for details which were reviewed in writing and the patient given a copy.   F/u can be as needed

## 2011-03-16 ENCOUNTER — Telehealth: Payer: Self-pay | Admitting: Internal Medicine

## 2011-03-16 NOTE — Telephone Encounter (Signed)
Notes Recorded by Sandrea Hughs, MD on 03/16/2011 at 9:40 AM Call pt: Reviewed cxr and no acute change so no change in recommendations made at [facility] # provided above - left message on machine per request above stating Dr. Sherene Sires reviewed cxr and shows no acute change.  She should cont with recs from OV.  Advised if she has any further questions to pls call back.

## 2011-03-17 NOTE — Progress Notes (Signed)
Quick Note:  Spoke with pt and notified of results per Dr. Wert. Pt verbalized understanding and denied any questions.  ______ 

## 2011-04-08 ENCOUNTER — Telehealth: Payer: Self-pay | Admitting: Internal Medicine

## 2011-04-08 MED ORDER — BUDESONIDE 0.25 MG/2ML IN SUSP: 0.2500 mg | Freq: Two times a day (BID) | RESPIRATORY_TRACT | Status: DC

## 2011-04-08 MED ORDER — ARFORMOTEROL TARTRATE 15 MCG/2ML IN NEBU: 15.0000 ug | INHALATION_SOLUTION | Freq: Two times a day (BID) | RESPIRATORY_TRACT | Status: DC

## 2011-04-08 NOTE — Telephone Encounter (Signed)
Called and spoke with pt.  Pt states she was recently seen by MW on 03/15/11 and was told she can now get her neb refills through her pcp.  However, pt states she is currently out and is requesting we call in just a 1 month supply on her neb meds and then she will get future refills through her pcp.  Informed pt i will call in a month supply of neb meds to her pharmacy:  [facility].  Pt verbalized understanding and had no further questions or concerns.

## 2011-06-10 ENCOUNTER — Telehealth: Payer: Self-pay | Admitting: Internal Medicine

## 2011-06-10 NOTE — Telephone Encounter (Signed)
Patient states that [facility] has faxed several time to MW to fill out papers as to why patient needs pulmicort nebs. Pt's order has been placed on hold for now until [facility] gets the paperwork back. Pt is aware that MW is out of the office this afternoon. Will send to MW and Verlon Au to advise.   [facility] [phone] Nicki -----in case we need to call them on behalf of the patient.

## 2011-06-11 NOTE — Telephone Encounter (Signed)
Leslie pls f/u on this form and with [facility]. thanks

## 2011-06-11 NOTE — Telephone Encounter (Signed)
Have not seen anything come across my desk

## 2011-06-14 NOTE — Telephone Encounter (Signed)
Amberly called back - states they are needing OV notes from Dec 2012 until present for Medicare Audit.  Once they receive this information, they will be able to ship medication.  Requesting notes to be faxed to [phone] Attn: Amberly.  Only one OV note from this time frame available from Feb -- Amberly aware this has been faxed.  Called, spoke with pt. I apologized for any inconvenience this has caused her.  Advised on above.  She verbalized understanding of this and will call back if anything further is needed.

## 2011-06-14 NOTE — Telephone Encounter (Signed)
Called [facility] - spoke with Amberly - was advised there system is down.  She will pull "hard copy" and will call back with what information is still needed.  Will hold into triage as we still need to call pt.

## 2011-06-14 NOTE — Telephone Encounter (Signed)
Pt is upset that this hasn't gotten taken care of.  Pt requests to be called w/ a status update at [phone].  [facility]'s number is [phone] for [place] or [place].

## 2011-06-18 ENCOUNTER — Telehealth: Payer: Self-pay | Admitting: Internal Medicine

## 2011-06-18 NOTE — Telephone Encounter (Signed)
Gweneth Dimitri, RN 06/14/2011 10:20 AM Signed  Samara Snide called back - states they are needing OV notes from Dec 2012 until present for Medicare Audit. Once they receive this information, they will be able to ship medication. Requesting notes to be faxed to [phone] Attn: Amberly. Only one OV note from this time frame available from Feb -- Amberly aware this has been faxed. [facility] [phone]    --I called [facility] and spoke with Katie in billing to see what is needed. She stated they needed pt November 2012 office note faxed over to them. They have received everything else and is not sure what form pt is regarding to. Also Florentina Addison stated that they shipped pt's budesonide to her on 06/10/11 for 30 day supply so she should already have this. I have faxed over nov 2012 OV note over to Katie at [phone] x1 for pt

## 2011-06-18 NOTE — Telephone Encounter (Signed)
Returning call can be reached at [phone].[name]

## 2011-06-18 NOTE — Telephone Encounter (Signed)
Pt states she spoke with [facility] and they're faxing over papers today.Lacey Stanton

## 2011-06-18 NOTE — Telephone Encounter (Signed)
Called and spoke with pt and she is aware that these papers have been faxed.

## 2011-09-02 ENCOUNTER — Telehealth: Payer: Self-pay | Admitting: Internal Medicine

## 2011-09-02 NOTE — Telephone Encounter (Signed)
i spoke with pt and is aware of this. Nothing further was needed

## 2011-09-02 NOTE — Telephone Encounter (Signed)
I spoke with pt and she states she is going to have teeth implants. This is not scheduled yet but she is going out of town on Friday and when she gets back is when they will decide on dates Dr. Clent Ridges will be putting the implants in. Pt is wanting to know if Dr. Sherene Sires feels like this is okay for her to have done. Pt stated when we call her back and if she does not answer to please leave a detailed message on her VM. Please advise Dr. Sherene Sires thanks

## 2011-09-02 NOTE — Telephone Encounter (Signed)
Fine with me

## 2012-06-29 ENCOUNTER — Ambulatory Visit (INDEPENDENT_AMBULATORY_CARE_PROVIDER_SITE_OTHER): Payer: Medicare Other | Admitting: Internal Medicine

## 2012-06-29 ENCOUNTER — Encounter: Payer: Self-pay | Admitting: Internal Medicine

## 2012-06-29 VITALS — BP 128/70 | HR 62 | Temp 98.4°F | Ht 62.0 in | Wt 129.0 lb

## 2012-06-29 DIAGNOSIS — J479 Bronchiectasis, uncomplicated: Secondary | ICD-10-CM

## 2012-06-29 MED ORDER — PREDNISONE (PAK) 10 MG PO TABS: ORAL_TABLET | ORAL | Status: DC

## 2012-06-29 NOTE — Progress Notes (Signed)
Subjective:    Patient ID: Lacey Stanton, female    DOB: 1926/08/29   MRN: [id number]   Brief patient profile:  70  yowf never smoker carries dx of bronchiectasis since the 80's  April 15, 2009 sp hospitalization at [facility] with pna 2/18 - 20/11 and much better in am after brovana at 8 am but by 2 pm feels she needs something else so using various other bronchodilators. rec add budesonide to brovana and treat like asthma sinc needing so much albuterol.   04/21/2010 ov for surgical clearance for R TKR.  Sleeps on 02 every night no am exac walking around town[place] do get short of breath walking to curb but does not need to stop rec You have moderately severe airflow obstruction and are at increased risk of pulmonary complications post operatively.  If your knee gets worse to where is limiting your desired activity you should have surgery where you a have pulmonary doctor who can see you for any complications that arise  Quinine or tonic water should help with cramps if not try ginkabiloba but these are not regulated by the FDA since they are food additives  07/01/2010 ov/Ottis Vacha doing ok on neb bud/brovana bid and rare need for xopenex.  No purulent sputum.  rec When mucus turns nasty go ahead and take avelox 400 mg x 5 days and if not 100% better we need to see you  GERD (REFLUX)  Diet  08/03/2010 ov/Alilah Mcmeans cc ok until 630 pm each night cc sob better p uses evening neb but not until then.  Sleeping ok without nocturnal  or early am exac of resp c/o's or need for noct saba. Good activity tolerance until late in the day.  Has not tried taking neb a little earlier nor using xopenex rec When mucus turns nasty and stays  go ahead and take avelox 400 mg x 5 days and if not 100% better we need to see you  Ok to use pm nebulizer a little bit earlier, it doesn't have to be exactly 12 hours Continue to use the flutter and mucinex as needed  GERD (REFLUX) diet  09/16/2010 f/u ov/Marinda Tyer cc doe walking to  neighbor's townhouse and back,  Exercise at gym including 10 min of walking flat moderate pace.   Wants to take on water aerobics next.      12/09/2010 f/u ov/Pearl Bents cc ok until 11/10 acute onset nasal congestion/ cough> yellow phlegm and low grade fever so started avelox x 5 day course and much better on day of ov, symptoms peaked 11/12-13. Last took avelox months ago. rec Try levaquin 750 x one daily x 5 days for next flare   03/15/2011 f/u ov/Jalayla Chrismer cc cough resolved to her satisfaction, hasn't taken levaquin course recently. No limiting sob. rec rx prn mucinex dm  06/29/2012 f/u ov/Lutisha Knoche re bronchiectasis on 02 just at hs and bud/brovana bid maint rx Chief Complaint  Patient presents with  . Follow-up    Pt c/o increased SOB for the past month. She feels that nebs are not lasting long enough any longer.   at baseline maintaining brovana budesonide and rarely needing saba hfa Then one month pta fever no cough but sob and increased proaire and used 02  > ov with Dr Martha Clan rx Avelox and fever resolved but not breathing to her satisfaction and using proaire 2 x daily but no neb saba.  Doe x > slow adls  No obvious daytime variabilty or assoc   cough  or cp or chest tightness, subjective wheeze overt sinus or hb symptoms. No unusual exp hx or h/o childhood pna/ asthma or premature birth to her knowledge.     Sleeping ok without nocturnal  or early am exacerbation  of respiratory  c/o's or need for noct saba. Also denies any obvious fluctuation of symptoms with weather or environmental changes or other aggravating or alleviating factors except as outlined above   Current Medications, Allergies, Past Medical History, Past Surgical History, Family History, and Social History were reviewed in [employer] record.  ROS  The following are not active complaints unless bolded sore throat, dysphagia, dental problems, itching, sneezing,  nasal congestion or excess/ purulent  secretions, ear ache,   fever, chills, sweats, unintended wt loss, pleuritic or exertional cp, hemoptysis,  orthopnea pnd or leg swelling, presyncope, palpitations, heartburn, abdominal pain, anorexia, nausea, vomiting, diarrhea  or change in bowel or urinary habits, change in stools or urine, dysuria,hematuria,  rash, arthralgias, visual complaints, headache, numbness weakness or ataxia or problems with walking or coordination,  change in mood/affect or memory.    .  Past Medical History:  Bronchiectasis  - CT 11/07/08: Bronchiectasis in the lingular > rml plus small HH  - Sinus CT neg 04/17/09  - HFA 25 > 75% effective April 15, 2009  -PFT's 09/16/2010   FEV1  .89( 55%)  Ratio 49  No better with B2,  DLCO 62 > improved to 107 Pneumonia > cleared radiographically 07/01/2010     - Pneumovax March 2011  Allergic Rhinitis  Pulmonary hemorrhage 1979   Osteoarthritis      - s/p  L TKR 02/2000 >>  Difficult recovery Hypothyroidism  GERD                Objective:   Physical Exam  Edentulous pleasant amb wf nad  Wt 125 08/03/2010  > 122 09/16/2010  > 128 12/09/2010 > 03/15/2011  133> 6/52014  129  GEN: A/Ox3; pleasant , NAD, elderly .  HEENT:  [place]/AT,  EACs-clear, TMs-wnl, NOSE-clear, THROAT-clear, no lesions, no postnasal drip or exudate noted.   NECK:  Supple w/ fair ROM; no JVD; normal carotid impulses w/o bruits; no thyromegaly or nodules palpated; no lymphadenopathy.  RESP  no accessory muscle use, no dullness to percussion.  CARD:  RRR, no m/r/g  , no peripheral edema, pulses intact, no cyanosis or clubbing.  GI:   Soft & nt; nml bowel sounds; no organomegaly or masses detected.  Musc:  Warm bil, no deformities or joint swelling noted.            CXR  03/15/2011 : Chronic bronchitic changes and linear scarring changes within the medial right lung base - unchanged. Old thoracolumbar vertebral body compression fracture - unchanged. No acute  findings.       Assessment & Plan:

## 2012-06-29 NOTE — Patient Instructions (Addendum)
Increase zantac (ranitidine) to 150 mg after bfast and after supper   GERD (REFLUX)  is an extremely common cause of respiratory symptoms, many times with no significant heartburn at all.    It can be treated with medication, but also with lifestyle changes including avoidance of late meals, excessive alcohol, smoking cessation, and avoid fatty foods, chocolate, peppermint, colas, red wine, and acidic juices such as orange juice.  NO MINT OR MENTHOL PRODUCTS SO NO COUGH DROPS  USE SUGARLESS CANDY INSTEAD (jolley ranchers or Stover's)  NO OIL BASED VITAMINS - use powdered substitutes.   Continue to use the nebulizer brovana and budesonide and then 12 hours later.  Prednisone 10 mg take  4 each am x 2 days,   2 each am x 2 days,  1 each am x2days and stop   Work on inhaler technique:  relax and gently blow all the way out then take a nice smooth deep breath back in, triggering the inhaler at same time you start breathing in.  Hold for up to 5 seconds if you can.  Rinse and gargle with water when done   If your mouth or throat starts to bother you,   I suggest you time the inhaler to your dental care and after using the inhaler(s) brush teeth and tongue with a baking soda containing toothpaste and when you rinse this out, gargle with it first to see if this helps your mouth and throat.     Only use your albuterol (proaire) as a rescue medication to be used if you can't catch your breath by resting or doing a relaxed purse lip breathing pattern. The less you use it, the better it will work when you need it.  Up to 2 puffs every 4 hours but the goal is not to need it at all  Please schedule a follow up office visit in 6 weeks, call sooner if needed with pfts

## 2012-06-30 NOTE — Assessment & Plan Note (Addendum)
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 Acid reflux > diet, increase h1 recommended and reviewed  ? Allergic/ asthmatic component   Will dose with prednisone x 6 days then then regroup with pfts  The proper method of use, as well as anticipated side effects, of a metered-dose inhaler are discussed and demonstrated to the patient. Improved effectiveness after extensive coaching during this visit to a level of approximately  75%

## 2012-08-10 ENCOUNTER — Ambulatory Visit (INDEPENDENT_AMBULATORY_CARE_PROVIDER_SITE_OTHER): Payer: Medicare Other | Admitting: Internal Medicine

## 2012-08-10 ENCOUNTER — Encounter: Payer: Self-pay | Admitting: Internal Medicine

## 2012-08-10 VITALS — BP 114/62 | HR 76 | Temp 97.7°F | Ht 61.5 in | Wt 127.0 lb

## 2012-08-10 DIAGNOSIS — J479 Bronchiectasis, uncomplicated: Secondary | ICD-10-CM

## 2012-08-10 MED ORDER — BUDESONIDE 0.5 MG/2ML IN SUSP: 0.5000 mg | Freq: Two times a day (BID) | RESPIRATORY_TRACT | Status: DC

## 2012-08-10 MED ORDER — MOXIFLOXACIN HCL 400 MG PO TABS: 400.0000 mg | ORAL_TABLET | Freq: Every day | ORAL | Status: DC

## 2012-08-10 NOTE — Patient Instructions (Addendum)
No change in treatment plan   Use flutter as mucinex as needed  If mucus gets nasty > avelox 400 mg x 10 days   Please schedule a follow up visit in 3 months but call sooner if needed

## 2012-08-10 NOTE — Progress Notes (Signed)
PFT done today. 

## 2012-08-10 NOTE — Progress Notes (Signed)
Subjective:    Patient ID: Lacey Stanton, female    DOB: May 15, 1926   MRN: [id number]   Brief patient profile:  89  yowf never smoker carries dx of bronchiectasis since the 80's.    06/29/2012 f/u ov/Wert re bronchiectasis on 02 just at hs and bud/brovana bid maint rx Chief Complaint  Patient presents with  . Follow-up    Pt c/o increased SOB for the past month. She feels that nebs are not lasting long enough any longer.   at baseline maintaining brovana budesonide and rarely needing saba hfa Then one month pta fever no cough but sob and increased proaire and used 02  > ov with Dr Martha Clan rx Avelox and fever resolved but not breathing to her satisfaction and using proaire 2 x daily but no neb saba. Doe x > slow adls rec increase zantac (ranitidine) to 150 mg after bfast and after supper  GERD  diet Continue to use the nebulizer brovana and budesonide and then 12 hours later. Prednisone 10 mg take  4 each am x 2 days,   2 each am x 2 days,  1 each am x2days and stop  Work on inhaler technique:t.    Only use your albuterol (proaire) as rescue  08/10/2012 f/u ov/Wert re bronchiectasi FEV1  0.78 (50%) / uses 02 just at hs Chief Complaint  Patient presents with  . Followup with PFT    Pt states her breathing is back at baseline. She c/o occ wheezing.   on laba/ics with am mucus white  Rare need for saba, not limted from desired activities   No obvious daytime variabilty or assoc   cough or cp or chest tightness, subjective wheeze overt sinus or hb symptoms. No unusual exp hx or h/o childhood pna/ asthma or premature birth to her knowledge.     Sleeping ok without nocturnal  or early am exacerbation  of respiratory  c/o's or need for noct saba. Also denies any obvious fluctuation of symptoms with weather or environmental changes or other aggravating or alleviating factors except as outlined above   Current Medications, Allergies, Complete Past Medical History, Past Surgical History,  Family History, and Social History were reviewed in [employer] record.  ROS  The following are not active complaints unless bolded sore throat, dysphagia, dental problems, itching, sneezing,  nasal congestion or excess/ purulent secretions, ear ache,   fever, chills, sweats, unintended wt loss, pleuritic or exertional cp, hemoptysis,  orthopnea pnd or leg swelling, presyncope, palpitations, heartburn, abdominal pain, anorexia, nausea, vomiting, diarrhea  or change in bowel or urinary habits, change in stools or urine, dysuria,hematuria,  rash, arthralgias, visual complaints, headache, numbness weakness or ataxia or problems with walking or coordination,  change in mood/affect or memory.    .  Past Medical History:  Bronchiectasis  - CT 11/07/08: Bronchiectasis in the lingular > rml plus small HH  - Sinus CT neg 04/17/09  - HFA 25 > 75% effective April 15, 2009  -PFT's 09/16/2010   FEV1  .89( 55%)  Ratio 49  No better with B2,  DLCO 62 > improved to 107 Pneumonia > cleared radiographically 07/01/2010     - Pneumovax March 2011  Allergic Rhinitis  Pulmonary hemorrhage 1979   Osteoarthritis      - s/p  L TKR 02/2000 >>  Difficult recovery Hypothyroidism  GERD                Objective:   Physical Exam  Edentulous pleasant amb wf nad  Wt 125 08/03/2010  > 122 09/16/2010  > 128 12/09/2010 > 03/15/2011  133> 6/52014  129  GEN: A/Ox3; pleasant , NAD, elderly .  HEENT:  [place]/AT,  EACs-clear, TMs-wnl, NOSE-clear, THROAT-clear, no lesions, no postnasal drip or exudate noted.   NECK:  Supple w/ fair ROM; no JVD; normal carotid impulses w/o bruits; no thyromegaly or nodules palpated; no lymphadenopathy.  RESP  no accessory muscle use, no dullness to percussion.  CARD:  RRR, no m/r/g  , no peripheral edema, pulses intact, no cyanosis or clubbing.  GI:   Soft & nt; nml bowel sounds; no organomegaly or masses detected.  Musc:  Warm bil, no deformities or joint swelling  noted.            CXR  03/15/2011 : Chronic bronchitic changes and linear scarring changes within the medial right lung base - unchanged. Old thoracolumbar vertebral body compression fracture - unchanged. No acute findings.       Assessment & Plan:

## 2012-08-12 NOTE — Assessment & Plan Note (Addendum)
-   CT 11/07/08: Bronchiectasis in the lingular > rml plus small HH  - Sinus CT neg 04/17/09  - HFA   75% p coaching 08/10/2012  - -PFT's 09/16/2010   FEV1  .89( 55%)  Ratio 49  No better with B2,  DLCO 62 > improved to 107 - PFTs  08/10/2012    FEV1  0.78 (50%) ratio 58,  No better p B2  DLCO 55 improved to 86% - Alpha one AT genotype 07/07/10: MM   Adequate control on present rx which includes laba/ics in view of severity of airflow obst assoc with dx :  reviewed each maintenance medication  in detail including most importantly the difference between maintenance and as needed and under what circumstances the prns are to be used.  Please see instructions for details which were reviewed in writing and the patient given a copy.

## 2012-11-08 ENCOUNTER — Ambulatory Visit (INDEPENDENT_AMBULATORY_CARE_PROVIDER_SITE_OTHER)
Admission: RE | Admit: 2012-11-08 | Discharge: 2012-11-08 | Disposition: A | Discharge status: Home / Self Care | Payer: Medicare Other | Source: Ambulatory Visit | Attending: Internal Medicine | Admitting: Internal Medicine

## 2012-11-08 ENCOUNTER — Encounter: Payer: Self-pay | Admitting: Internal Medicine

## 2012-11-08 ENCOUNTER — Ambulatory Visit (INDEPENDENT_AMBULATORY_CARE_PROVIDER_SITE_OTHER): Payer: Medicare Other | Admitting: Internal Medicine

## 2012-11-08 VITALS — BP 124/64 | HR 68 | Temp 98.0°F | Ht 62.0 in | Wt 131.0 lb

## 2012-11-08 DIAGNOSIS — J479 Bronchiectasis, uncomplicated: Secondary | ICD-10-CM

## 2012-11-08 NOTE — Progress Notes (Signed)
Subjective:    Patient ID: Lacey Stanton, female    DOB: 1926/08/22   MRN: [id number]   Brief patient profile:  89  yowf never smoker carries dx of bronchiectasis since the 80's.    06/29/2012 f/u ov/Raphael Fitzpatrick re bronchiectasis on 02 just at hs and bud/brovana bid maint rx Chief Complaint  Patient presents with  . Follow-up    Pt c/o increased SOB for the past month. She feels that nebs are not lasting long enough any longer.   at baseline maintaining brovana budesonide and rarely needing saba hfa Then one month pta fever no cough but sob and increased proaire and used 02  > ov with Dr Martha Clan rx Avelox and fever resolved but not breathing to her satisfaction and using proaire 2 x daily but no neb saba. Doe x > slow adls rec increase zantac (ranitidine) to 150 mg after bfast and after supper  GERD  diet Continue to use the nebulizer brovana and budesonide and then 12 hours later. Prednisone 10 mg take  4 each am x 2 days,   2 each am x 2 days,  1 each am x2days and stop  Work on inhaler technique:t.    Only use your albuterol (proaire) as rescue  08/10/2012 f/u ov/Savior Himebaugh re bronchiectasi FEV1  0.78 (50%) / uses 02 just at hs Chief Complaint  Patient presents with  . Followup with PFT    Pt states her breathing is back at baseline. She c/o occ wheezing.   on laba/ics with am mucus white  Rare need for saba, not limted from desired activities  Rec No change in treatment plan Use flutter as mucinex as needed If mucus gets nasty > avelox 400 mg x 10 days     11/08/2012 f/u ov/Adante Courington re: bronchiectasis, has not used avelox Chief Complaint  Patient presents with  . Follow-up    Pt states that her breathing is doing well, and has minimal cough-after uses nebs- prod with white sputum.     Walking around new complex 20 min s stopping or ex up to an hour s sob or need for saba   No obvious daytime variabilty or assoc   cp or chest tightness, subjective wheeze overt sinus or hb symptoms. No  unusual exp hx or h/o childhood pna/ asthma or premature birth to her knowledge.     Sleeping ok without nocturnal  or early am exacerbation  of respiratory  c/o's or need for noct saba. Also denies any obvious fluctuation of symptoms with weather or environmental changes or other aggravating or alleviating factors except as outlined above   Current Medications, Allergies, Complete Past Medical History, Past Surgical History, Family History, and Social History were reviewed in [employer] record.  ROS  The following are not active complaints unless bolded sore throat, dysphagia, dental problems, itching, sneezing,  nasal congestion or excess/ purulent secretions, ear ache,   fever, chills, sweats, unintended wt loss, pleuritic or exertional cp, hemoptysis,  orthopnea pnd or leg swelling, presyncope, palpitations, heartburn, abdominal pain, anorexia, nausea, vomiting, diarrhea  or change in bowel or urinary habits, change in stools or urine, dysuria,hematuria,  rash, arthralgias, visual complaints, headache, numbness weakness or ataxia or problems with walking or coordination,  change in mood/affect or memory.    .  Past Medical History:  Bronchiectasis  - CT 11/07/08: Bronchiectasis in the lingular > rml plus small HH  - Sinus CT neg 04/17/09  - HFA 25 > 75% effective  April 15, 2009  -PFT's 09/16/2010   FEV1  .89( 55%)  Ratio 49  No better with B2,  DLCO 62 > improved to 107 Pneumonia > cleared radiographically 07/01/2010     - Pneumovax March 2011  Allergic Rhinitis  Pulmonary hemorrhage 1979   Osteoarthritis      - s/p  L TKR 02/2000 >>  Difficult recovery Hypothyroidism  GERD                Objective:   Physical Exam  Edentulous pleasant amb wf nad  Wt 125 08/03/2010  > 122 09/16/2010  > 128 12/09/2010 > 03/15/2011  133> 6/52014  129> 11/08/2012 131   GEN: A/Ox3; pleasant , NAD, elderly .  HEENT:  [place]/AT,  EACs-clear, TMs-wnl, NOSE-clear, THROAT-clear, no  lesions, no postnasal drip or exudate noted.   NECK:  Supple w/ fair ROM; no JVD; normal carotid impulses w/o bruits; no thyromegaly or nodules palpated; no lymphadenopathy.  RESP  no accessory muscle use, no dullness to percussion.  CARD:  RRR, no m/r/g  , no peripheral edema, pulses intact, no cyanosis or clubbing.  GI:   Soft & nt; nml bowel sounds; no organomegaly or masses detected.  Musc:  Warm bil, no deformities or joint swelling noted.            CXR  11/08/2012 :  No acute cardiopulmonary abnormality seen.        Assessment & Plan:

## 2012-11-08 NOTE — Assessment & Plan Note (Addendum)
-   CT 11/07/08: Bronchiectasis in the lingular > rml plus small HH  - Sinus CT neg 04/17/09  - HFA   75% p coaching 08/10/2012  - -PFT's 09/16/2010   FEV1  .89( 55%)  Ratio 49  No better with B2,  DLCO 62 > improved to 107 - PFTs  08/10/2012    FEV1  0.78 (50%) ratio 58,  No better p B2  DLCO 55 improved to 86% - Alpha one AT genotype 07/07/10: MM  Adequate control on present rx, reviewed > no change in rx needed      Each maintenance medication was reviewed in detail including most importantly the difference between maintenance and as needed and under what circumstances the prns are to be used.  Please see instructions for details which were reviewed in writing and the patient given a copy.

## 2012-11-08 NOTE — Patient Instructions (Addendum)
Please remember to go to the  x-ray department downstairs for your tests - we will call you with the results when they are available.  Vitamins A D E and K have FDA limits you shouldn't exceed because they can't be cleared by your body in excess   Please schedule a follow up visit in 3 months but call sooner if needed

## 2012-11-09 ENCOUNTER — Telehealth: Payer: Self-pay | Admitting: Internal Medicine

## 2012-11-09 NOTE — Telephone Encounter (Signed)
Notes Recorded by Christen Butter, CMA on 11/08/2012 at 4:57 PM LMTCB ------  Notes Recorded by Nyoka Cowden, MD on 11/08/2012 at 1:27 PM Call pt: Reviewed cxr and no acute change so no change in recommendations made at ov      Pt advised. Lacey Stanton, CMA

## 2013-02-06 ENCOUNTER — Ambulatory Visit (INDEPENDENT_AMBULATORY_CARE_PROVIDER_SITE_OTHER): Payer: Medicare Other | Admitting: Internal Medicine

## 2013-02-06 ENCOUNTER — Encounter (INDEPENDENT_AMBULATORY_CARE_PROVIDER_SITE_OTHER): Payer: Self-pay

## 2013-02-06 ENCOUNTER — Encounter: Payer: Self-pay | Admitting: Internal Medicine

## 2013-02-06 VITALS — BP 130/60 | HR 70 | Temp 98.1°F | Ht 62.0 in | Wt 131.2 lb

## 2013-02-06 DIAGNOSIS — J479 Bronchiectasis, uncomplicated: Secondary | ICD-10-CM

## 2013-02-06 MED ORDER — FLUTTER DEVI: Status: AC

## 2013-02-06 NOTE — Patient Instructions (Signed)
Pantoprazole (protonix) 40 mg   Take 30-60 min before first meal of the day and Zantac 150  mg one @ bedtime until return to office - this is the best way to tell whether stomach acid is contributing to your problem.    GERD (REFLUX)  is an extremely common cause of respiratory symptoms, many times with no significant heartburn at all.    It can be treated with medication, but also with lifestyle changes including avoidance of late meals, excessive alcohol, smoking cessation, and avoid fatty foods, chocolate, peppermint, colas, red wine, and acidic juices such as orange juice.  NO MINT OR MENTHOL PRODUCTS SO NO COUGH DROPS  USE SUGARLESS CANDY INSTEAD (jolley ranchers or Stover's)  NO OIL BASED VITAMINS - use powdered substitutes.    For cough > flutter valve and mucinex or mucinex dm up 1200 mg every 12hours   Relax and do purse lip breath breathing as much as possible.   Please schedule a follow up visit in 3 months but call sooner if needed

## 2013-02-06 NOTE — Progress Notes (Signed)
Subjective:    Patient ID: Lacey Stanton, female    DOB: 07-20-1926   MRN: [id number]   Brief patient profile:  [age]  yowf never smoker carries dx of bronchiectasis since the 80's.  History of Present Illness  06/29/2012 f/u ov/Alberto Schoch re bronchiectasis on 02 just at hs and bud/brovana bid maint rx Chief Complaint  Patient presents with  . Follow-up    Pt c/o increased SOB for the past month. She feels that nebs are not lasting long enough any longer.   at baseline maintaining brovana budesonide and rarely needing saba hfa Then one month pta fever no cough but sob and increased proaire and used 02  > ov with Dr Martha Clanwhyte rx Avelox and fever resolved but not breathing to her satisfaction and using proaire 2 x daily but no neb saba. Doe x > slow adls rec increase zantac (ranitidine) to 150 mg after bfast and after supper  GERD  diet Continue to use the nebulizer brovana and budesonide and then 12 hours later. Prednisone 10 mg take  4 each am x 2 days,   2 each am x 2 days,  1 each am x2days and stop  Work on inhaler technique    Only use your albuterol (proaire) as rescue  08/10/2012 f/u ov/Aicha Clingenpeel re bronchiectasi FEV1  0.78 (50%) / uses 02 just at hs Chief Complaint  Patient presents with  . Followup with PFT    Pt states her breathing is back at baseline. She c/o occ wheezing.   on laba/ics with am mucus white  Rare need for saba, not limted from desired activities  Rec No change in treatment plan Use flutter as mucinex as needed If mucus gets nasty > avelox 400 mg x 10 days     11/08/2012 f/u ov/Drew Herman re: bronchiectasis, has not used avelox Chief Complaint  Patient presents with  . Follow-up    Pt states that her breathing is doing well, and has minimal cough-after uses nebs- prod with white sputum.  Walking around [place] complex 20 min s stopping or ex up to an hour s sob or need for saba  rec No change rx  02/06/2013 f/u ov/Sameen Leas re: bronchiectasis on maint brovana/ bud plus prn saba  hfa Chief Complaint  Patient presents with  . Follow-up    Pt states doing well and denies any new co's today.   typically more congested after lunch and feels her self wheezing so uses albuterol but not really helping much and  not using mucinex much  At all nor the flutter. Not limited by breathing though from desired activities  No obvious daytime variabilty or assoc  cp or chest tightness, subjective wheeze overt sinus or hb symptoms. No unusual exp hx or h/o childhood pna/ asthma or premature birth to her knowledge.    Sleeping ok without nocturnal  or early am exacerbation  of respiratory  c/o's or need for noct saba. Also denies any obvious fluctuation of symptoms with weather or environmental changes or other aggravating or alleviating factors except as outlined above   Current Medications, Allergies, Complete Past Medical History, Past Surgical History, Family History, and Social History were reviewed in [facility] record.  ROS  The following are not active complaints unless bolded sore throat, dysphagia, dental problems, itching, sneezing,  nasal congestion or excess/ purulent secretions, ear ache,   fever, chills, sweats, unintended wt loss, pleuritic or exertional cp, hemoptysis,  orthopnea pnd or leg swelling, presyncope, palpitations, heartburn,  abdominal pain, anorexia, nausea, vomiting, diarrhea  or change in bowel or urinary habits, change in stools or urine, dysuria,hematuria,  rash, arthralgias, visual complaints, headache, numbness weakness or ataxia or problems with walking or coordination,  change in mood/affect or memory.    .  Past Medical History:  Bronchiectasis  - CT 11/07/08: Bronchiectasis in the lingular > rml plus small HH  - Sinus CT neg 04/17/09  - HFA 25 > 75% effective April 15, 2009  -PFT's 09/16/2010   FEV1  .89( 55%)  Ratio 49  No better with B2,  DLCO 62 > improved to 107 Pneumonia > cleared radiographically 07/01/2010     -  Pneumovax March 2011  Allergic Rhinitis  Pulmonary hemorrhage 1979   Osteoarthritis      - s/p  L TKR 02/2000 >>  Difficult recovery Hypothyroidism  GERD                Objective:   Physical Exam  Edentulous pleasant amb wf nad with prominent pseudowheeze better with purse lip   Wt 125 08/03/2010  > 122 09/16/2010  > 128 12/09/2010 > 03/15/2011  133> 6/52014  129> 11/08/2012 131 > 02/06/13   GEN: A/Ox3; pleasant , NAD, elderly .  HEENT:  [place]/AT,  EACs-clear, TMs-wnl, NOSE-clear, THROAT-clear, no lesions, no postnasal drip or exudate noted.   NECK:  Supple w/ fair ROM; no JVD; normal carotid impulses w/o bruits; no thyromegaly or nodules palpated; no lymphadenopathy.  RESP  no accessory muscle use, no dullness to percussion. Min bilateral exp rhonchi  CARD:  RRR, no m/r/g  , no peripheral edema, pulses intact, no cyanosis or clubbing.  GI:   Soft & nt; nml bowel sounds; no organomegaly or masses detected.  Musc:  Warm bil, no deformities or joint swelling noted.     CXR  11/08/2012 : No acute cardiopulmonary abnormality seen.    Assessment & Plan:

## 2013-02-07 ENCOUNTER — Encounter: Payer: Self-pay | Admitting: Internal Medicine

## 2013-02-07 ENCOUNTER — Telehealth: Payer: Self-pay | Admitting: Internal Medicine

## 2013-02-07 MED ORDER — PANTOPRAZOLE SODIUM 40 MG PO TBEC: DELAYED_RELEASE_TABLET | ORAL | Status: DC

## 2013-02-07 NOTE — Assessment & Plan Note (Signed)
-   CT 11/07/08: Bronchiectasis in the lingula > rml plus small HH  - Sinus CT neg 04/17/09  - HFA   75% p coaching 08/10/2012  - -PFT's 09/16/2010   FEV1  .89( 55%)  Ratio 49  No better with B2,  DLCO 62 > improved to 107 - PFTs  08/10/2012    FEV1  0.78 (50%) ratio 58,  No better p B2  DLCO 55 improved to 86% - Alpha one AT genotype 07/07/10: MM  DDX of  difficult airways managment all start with A and  include Adherence, Ace Inhibitors, Acid Reflux, Active Sinus Disease, Alpha 1 Antitripsin deficiency, Anxiety masquerading as Airways dz,  ABPA,  allergy(esp in young), Aspiration (esp in elderly), Adverse effects of DPI,  Active smokers, plus two Bs  = Bronchiectasis and Beta blocker use..and one C= CHF  Adherence is always the initial "prime suspect" and is a multilayered concern that requires a "trust but verify" approach in every patient - starting with knowing how to use medications, especially inhalers, correctly, keeping up with refills and understanding the fundamental difference between maintenance and prns vs those medications only taken for a very short course and then stopped and not refilled.   ? Acid (or non-acid) GERD > suggested by symptoms after lunch > always difficult to exclude as up to 75% of pts in some series report no assoc GI/ Heartburn symptoms> rec max (24h)  acid suppression and diet restrictions/ reviewed and instructions given in writing.     Each maintenance medication was reviewed in detail including most importantly the difference between maintenance and as needed and under what circumstances the prns are to be used.  Please see instructions for details which were reviewed in writing and the patient given a copy.

## 2013-02-07 NOTE — Telephone Encounter (Signed)
Spoke with pt and is aware RX has been sent. Nothing further needed 

## 2013-02-27 ENCOUNTER — Telehealth: Payer: Self-pay | Admitting: Internal Medicine

## 2013-02-27 MED ORDER — BUDESONIDE 0.5 MG/2ML IN SUSP: 0.5000 mg | Freq: Two times a day (BID) | RESPIRATORY_TRACT | Status: DC

## 2013-02-27 NOTE — Telephone Encounter (Signed)
Returning call [phone]

## 2013-02-27 NOTE — Telephone Encounter (Signed)
Rx has been sent to [facility]. Pt is aware. Nothing further was needed.

## 2013-02-27 NOTE — Telephone Encounter (Signed)
lmomtcb x1- to confirm message/medication needed

## 2013-03-19 ENCOUNTER — Encounter: Payer: Self-pay | Admitting: Internal Medicine

## 2013-03-21 ENCOUNTER — Ambulatory Visit: Payer: Medicare Other | Admitting: Internal Medicine

## 2013-03-28 ENCOUNTER — Ambulatory Visit (INDEPENDENT_AMBULATORY_CARE_PROVIDER_SITE_OTHER): Payer: Medicare Other | Admitting: Internal Medicine

## 2013-03-28 ENCOUNTER — Encounter: Payer: Self-pay | Admitting: Internal Medicine

## 2013-03-28 ENCOUNTER — Telehealth: Payer: Self-pay | Admitting: Critical Care Medicine

## 2013-03-28 VITALS — BP 130/60 | HR 78 | Temp 98.1°F | Ht 62.0 in | Wt 130.2 lb

## 2013-03-28 DIAGNOSIS — R0902 Hypoxemia: Secondary | ICD-10-CM

## 2013-03-28 DIAGNOSIS — J479 Bronchiectasis, uncomplicated: Secondary | ICD-10-CM

## 2013-03-28 MED ORDER — PREDNISONE 10 MG PO TABS: ORAL_TABLET | ORAL | Status: DC

## 2013-03-28 MED ORDER — MOXIFLOXACIN HCL 400 MG PO TABS: 400.0000 mg | ORAL_TABLET | Freq: Every day | ORAL | Status: DC

## 2013-03-28 NOTE — Progress Notes (Signed)
Subjective:    Patient ID: Lacey Stanton, female    DOB: 12/01/1926   MRN: [id number]   Brief patient profile:  85  yowf never smoker carries dx of bronchiectasis since the 80's.    History of Present Illness  06/29/2012 f/u ov/Wert re bronchiectasis on 02 just at hs and bud/brovana bid maint rx Chief Complaint  Patient presents with  . Follow-up    Pt c/o increased SOB for the past month. She feels that nebs are not lasting long enough any longer.   at baseline maintaining brovana budesonide and rarely needing saba hfa Then one month pta fever no cough but sob and increased proaire and used 02  > ov with Dr Martha Clan rx Avelox and fever resolved but not breathing to her satisfaction and using proaire 2 x daily but no neb saba. Doe x > slow adls rec increase zantac (ranitidine) to 150 mg after bfast and after supper  GERD  diet Continue to use the nebulizer brovana and budesonide and then 12 hours later. Prednisone 10 mg take  4 each am x 2 days,   2 each am x 2 days,  1 each am x2days and stop  Work on inhaler technique    Only use your albuterol (proaire) as rescue  08/10/2012 f/u ov/Wert re bronchiectasis FEV1  0.78 (50%) / uses 02 just at hs Chief Complaint  Patient presents with  . Followup with PFT    Pt states her breathing is back at baseline. She c/o occ wheezing.   on laba/ics with am mucus white  Rare need for saba, not limted from desired activities  Rec No change in treatment plan Use flutter as mucinex as needed If mucus gets nasty > avelox 400 mg x 10 days     11/08/2012 f/u ov/Wert re: bronchiectasis, has not used avelox Chief Complaint  Patient presents with  . Follow-up    Pt states that her breathing is doing well, and has minimal cough-after uses nebs- prod with white sputum.  Walking around new complex 20 min s stopping or ex up to an hour s sob or need for saba  rec No change rx  02/06/2013 f/u ov/Wert re: bronchiectasis on maint brovana/ bud plus prn  saba hfa Chief Complaint  Patient presents with  . Follow-up    Pt states doing well and denies any new co's today.   typically more congested after lunch and feels her self wheezing so uses albuterol but not really helping much and  not using mucinex much  At all nor the flutter. Not limited by breathing though from desired activities rec Pantoprazole (protonix) 40 mg   Take 30-60 min before first meal of the day and Zantac 150  mg one @ bedtime until return to office - this is the best way to tell whether stomach acid is contributing to your problem.   GERD diet For cough > flutter valve and mucinex or mucinex dm up 1200 mg every 12hours  Relax and do purse lip breath breathing as much as possible.   03/28/2013 f/u ov/Wert re: bronchiectasis/severe airflow obst maint on brovana/bud  Chief Complaint  Patient presents with  . Follow-up    Pt here at the request of Cecelia Byars. Pt has noticed increased SOB over the past couple of months. She had recent CT Chest that was abnormal. She has been using her o2 more, and has needed to use her rescue inhaler several times per day.   since episode  in Dec 2014 no longer walking the neighborhood, not even walking to [place], no ex Ok lying flat and no excess mucus in am/ cough is less and mostly dry   Using proair maybe once a day around noon    No obvious daytime variabilty or assoc  cp or chest tightness, subjective wheeze overt sinus or hb symptoms. No unusual exp hx or h/o childhood pna/ asthma or premature birth to her knowledge.    Sleeping ok without nocturnal  or early am exacerbation  of respiratory  c/o's or need for noct saba. Also denies any obvious fluctuation of symptoms with weather or environmental changes or other aggravating or alleviating factors except as outlined above   Current Medications, Allergies, Complete Past Medical History, Past Surgical History, Family History, and Social History were reviewed in [facility]  electronic medical record.  ROS  The following are not active complaints unless bolded sore throat, dysphagia, dental problems, itching, sneezing,  nasal congestion or excess/ purulent secretions, ear ache,   fever, chills, sweats, unintended wt loss, pleuritic or exertional cp, hemoptysis,  orthopnea pnd or leg swelling, presyncope, palpitations, heartburn, abdominal pain, anorexia, nausea, vomiting, diarrhea  or change in bowel or urinary habits, change in stools or urine, dysuria,hematuria,  rash, arthralgias, visual complaints, headache, numbness weakness or ataxia or problems with walking or coordination,  change in mood/affect or memory.    .  Past Medical History:  Bronchiectasis  - CT 11/07/08: Bronchiectasis in the lingular > rml plus small HH  - Sinus CT neg 04/17/09  - HFA 25 > 75% effective April 15, 2009  -PFT's 09/16/2010   FEV1  .89( 55%)  Ratio 49  No better with B2,  DLCO 62 > improved to 107 Pneumonia > cleared radiographically 07/01/2010     - Pneumovax March 2011  Allergic Rhinitis  Pulmonary hemorrhage 1979   Osteoarthritis      - s/p  L TKR 02/2000 >>  Difficult recovery Hypothyroidism  GERD                Objective:   Physical Exam  Edentulous pleasant amb wf nad with prominent pseudowheeze better with purse lip   Wt 125 08/03/2010  > 122 09/16/2010  > 128 12/09/2010 > 03/15/2011  133> 6/52014  129> 11/08/2012 131 > 02/06/13   GEN: A/Ox3; pleasant , NAD, elderly .  HEENT:  /AT,  EACs-clear, TMs-wnl, NOSE-clear, THROAT-clear, no lesions, no postnasal drip or exudate noted.   NECK:  Supple w/ fair ROM; no JVD; normal carotid impulses w/o bruits; no thyromegaly or nodules palpated; no lymphadenopathy.  RESP  no accessory muscle use, no dullness to percussion.   bilateral mid exp rhonchi  CARD:  RRR, no m/r/g  , no peripheral edema, pulses intact, no cyanosis or clubbing.  GI:   Soft & nt; nml bowel sounds; no organomegaly or masses detected.  Musc:  Warm  bil, no deformities or joint swelling noted.     CXR  11/08/2012 : No acute cardiopulmonary abnormality seen.    Assessment & Plan:

## 2013-03-28 NOTE — Patient Instructions (Addendum)
Prednisone 10 mg take  4 each am x 2 days,   2 each am x 2 days,  1 each am x 2 days and stop   Work on inhaler technique:  relax and gently blow all the way out then take a nice smooth deep breath back in, triggering the inhaler at same time you start breathing in.  Hold for up to 5 seconds if you can.  Rinse and gargle with water when done.   For breathing: Proair 2 puffs up to every 4 h as needed  For coughing: mucinex 600 mg up to 2 every 12 hours as needed  For nasty mucus: Avelox 400 mg one daily x 5 days   Follow up in one month [place] Dr [place]

## 2013-03-28 NOTE — Telephone Encounter (Signed)
ROV w/ PW in [place] set for 4/14 @ 2:45 PM.  Pt made appt while in office & aware of [place] address.  Nothing further needed at this time.  Antionette FairyHolly D Pryor

## 2013-03-30 ENCOUNTER — Telehealth: Payer: Self-pay | Admitting: Internal Medicine

## 2013-03-30 DIAGNOSIS — R0902 Hypoxemia: Secondary | ICD-10-CM | POA: Insufficient documentation

## 2013-03-30 NOTE — Assessment & Plan Note (Signed)
03/28/13  Patient Saturations on Room Air at Rest = 93%  Patient Saturations on Room Air while Ambulating = 88%  Patient Saturations on 2 Liters of oxygen while Ambulating = 97%

## 2013-03-30 NOTE — Assessment & Plan Note (Addendum)
-   CT 11/07/08: Bronchiectasis in the lingula  > rml plus small HH  - Sinus CT neg 04/17/09  - HFA   75% p coaching 08/10/2012  - -PFT's 09/16/2010   FEV1  .89( 55%)  Ratio 49  No better with B2,  DLCO 62 > improved to 107 - PFTs  08/10/2012    FEV1  0.78 (50%) ratio 58,  No better p B2  DLCO 55 improved to 86% - Alpha one AT genotype 07/07/10: MM CT chest>   03/16/13 bronchiectasis with nodules  The proper method of use, as well as anticipated side effects, of a metered-dose inhaler are discussed and demonstrated to the patient. Improved effectiveness after extensive coaching during this visit to a level of approximately  75%   I had an extended  Summary discussion with the patient today lasting 15 to 20 minutes of a 25 minute visit on the following issues:   The CT findings are not a surprise and although she may have MAI I'm not at all convinced it's contributing to any of her symptoms and fairly certain the treatment would be worse than the dz  [facility] which is fine with me in the meantime:   Each maintenance medication was reviewed in detail including most importantly the difference between maintenance and as needed and under what circumstances the prns are to be used.  Please see instructions for details which were reviewed in writing and the patient given a copy.

## 2013-03-30 NOTE — Telephone Encounter (Signed)
Spoke with [facility] and they have not received the order. I spoke with Bjorn Loserhonda and [facility] I atc the pt to advise, NA, no voicemail. WCB. Lacey CurieJennifer Trinton Prewitt, CMA

## 2013-04-04 NOTE — Telephone Encounter (Signed)
Pt advised and she has spoken with [facility]. Carron CurieJennifer Meir Elwood, CMA

## 2013-04-09 LAB — PULMONARY FUNCTION TEST
DL/VA % pred: 86 %
DL/VA: 3.85 ml/min/mmHg/L
DLCO unc % pred: 55 %
DLCO unc: 11.62 ml/min/mmHg
FEF 25-75 Post: 0.42 L/sec
FEF 25-75 Pre: 0.34 L/sec
FEF2575-%Change-Post: 22 %
FEF2575-%Pred-Post: 40 %
FEF2575-%Pred-Pre: 32 %
FEV1-%Change-Post: 6 %
FEV1-%Pred-Post: 53 %
FEV1-%Pred-Pre: 50 %
FEV1-Post: 0.83 L
FEV1-Pre: 0.78 L
FEV1FVC-%Change-Post: 3 %
FEV1FVC-%Pred-Pre: 79 %
FEV6-%Change-Post: 8 %
FEV6-%Pred-Post: 70 %
FEV6-%Pred-Pre: 64 %
FEV6-Post: 1.38 L
FEV6-Pre: 1.28 L
FEV6FVC-%Change-Post: 4 %
FEV6FVC-%Pred-Post: 106 %
FEV6FVC-%Pred-Pre: 101 %
FVC-%Change-Post: 3 %
FVC-%Pred-Post: 65 %
FVC-%Pred-Pre: 64 %
FVC-Post: 1.39 L
Post FEV1/FVC ratio: 60 %
Post FEV6/FVC ratio: 99 %
Pre FEV1/FVC ratio: 58 %
Pre FEV6/FVC Ratio: 94 %
RV % pred: 120 %
RV: 2.82 L
TLC % pred: 81 %
TLC: 3.82 L

## 2013-05-08 ENCOUNTER — Encounter: Payer: Self-pay | Admitting: Critical Care Medicine

## 2013-05-08 ENCOUNTER — Ambulatory Visit (INDEPENDENT_AMBULATORY_CARE_PROVIDER_SITE_OTHER): Payer: Medicare Other | Admitting: Critical Care Medicine

## 2013-05-08 VITALS — BP 114/60 | HR 69 | Temp 97.9°F | Ht 63.0 in | Wt 132.0 lb

## 2013-05-08 DIAGNOSIS — J479 Bronchiectasis, uncomplicated: Secondary | ICD-10-CM

## 2013-05-08 NOTE — Patient Instructions (Signed)
Need to stay on oxygen 2Liter at bedtime and 2-3Liter with exertion No change in medications Continue to use flutter valve Return 2 months Tracy

## 2013-05-08 NOTE — Assessment & Plan Note (Addendum)
Stable Bronchiectasis, with ambulatory desaturation and nocturnal desaturation Former Lacey Stanton [place]  Last seen 03/2013 Prior studies as below - CT 11/07/08: Bronchiectasis in the lingula  > rml plus small HH  - Sinus CT neg 04/17/09  - HFA   75% p coaching 08/10/2012  - -PFT's 09/16/2010   FEV1  .89( 55%)  Ratio 49  No better with B2,  DLCO 62 > improved to 107 - PFTs  08/10/2012    FEV1  0.78 (50%) ratio 58,  No better p B2  DLCO 55 improved to 86% - Alpha one AT genotype 07/07/10: MM CT chest>   03/16/13 bronchiectasis with nodules  Plan Maintain Flutter valve Maintain BD neb meds Maintain oxygen 2L rest/QHS and 2-3 L exertion, needs to use portable system more

## 2013-05-08 NOTE — Progress Notes (Signed)
Subjective:    Patient ID: Lacey Stanton, female    DOB: 04-01-1926, 80 y.o.   MRN: [id number]  HPI 49  yowf never smoker carries dx of bronchiectasis since the 80's.    History of Present Illness  06/29/2012 f/u ov/Wert re bronchiectasis on 02 just at hs and bud/brovana bid maint rx Chief Complaint  Patient presents with  . Follow-up    Pt c/o increased SOB for the past month. She feels that nebs are not lasting long enough any longer.   at baseline maintaining brovana budesonide and rarely needing saba hfa Then one month pta fever no cough but sob and increased proaire and used 02  > ov with Dr Martha Clan rx Avelox and fever resolved but not breathing to her satisfaction and using proaire 2 x daily but no neb saba. Doe x > slow adls rec increase zantac (ranitidine) to 150 mg after bfast and after supper  GERD  diet Continue to use the nebulizer brovana and budesonide and then 12 hours later. Prednisone 10 mg take  4 each am x 2 days,   2 each am x 2 days,  1 each am x2days and stop  Work on inhaler technique    Only use your albuterol (proaire) as rescue  08/10/2012 f/u ov/Wert re bronchiectasis FEV1  0.78 (50%) / uses 02 just at hs Chief Complaint  Patient presents with  . Followup with PFT    Pt states her breathing is back at baseline. She c/o occ wheezing.   on laba/ics with am mucus white  Rare need for saba, not limted from desired activities  Rec No change in treatment plan Use flutter as mucinex as needed If mucus gets nasty > avelox 400 mg x 10 days     11/08/2012 f/u ov/Wert re: bronchiectasis, has not used avelox Chief Complaint  Patient presents with  . Follow-up    Pt states that her breathing is doing well, and has minimal cough-after uses nebs- prod with white sputum.  Walking around new complex 20 min s stopping or ex up to an hour s sob or need for saba  rec No change rx  02/06/2013 f/u ov/Wert re: bronchiectasis on maint brovana/ bud plus prn saba  hfa Chief Complaint  Patient presents with  . Follow-up    Pt states doing well and denies any new co's today.   typically more congested after lunch and feels her self wheezing so uses albuterol but not really helping much and  not using mucinex much  At all nor the flutter. Not limited by breathing though from desired activities rec Pantoprazole (protonix) 40 mg   Take 30-60 min before first meal of the day and Zantac 150  mg one @ bedtime until return to office - this is the best way to tell whether stomach acid is contributing to your problem.   GERD diet For cough > flutter valve and mucinex or mucinex dm up 1200 mg every 12hours  Relax and do purse lip breath breathing as much as possible.   03/28/2013 f/u ov/Wert re: bronchiectasis/severe airflow obst maint on brovana/bud  Chief Complaint  Patient presents with  . Follow-up    Pt here at the request of Cecelia Byars. Pt has noticed increased SOB over the past couple of months. She had recent CT Chest that was abnormal. She has been using her o2 more, and has needed to use her rescue inhaler several times per day.   since episode in  Dec 2014 no longer walking the neighborhood, not even walking to [place], no ex Ok lying flat and no excess mucus in am/ cough is less and mostly dry   Using proair maybe once a day around noon    No obvious daytime variabilty or assoc  cp or chest tightness, subjective wheeze overt sinus or hb symptoms. No unusual exp hx or h/o childhood pna/ asthma or premature birth to her knowledge.    Sleeping ok without nocturnal  or early am exacerbation  of respiratory  c/o's or need for noct saba. Also denies any obvious fluctuation of symptoms with weather or environmental changes or other aggravating or alleviating factors except as outlined above   05/08/2013 Chief Complaint  Patient presents with  . 1 month follow up    former MW pt.  Reports she was started on o2 24/7 during last OV with Dr. Sherene Sires.  Feels  breathing has improved since this visit.  ? need for o2 24/7 now.  DOE at baseline.  Has wheezing and prod cough with dark clear to clear mucus.   MW pt wants to tfr to [facility]. Dx Bronchiectasis , was put on 24/7 O2 03/2013, pt did not desire to stay on this. Pt had gotten worse since 12/2012, coughing more and dyspnea.  Rx pred pulse and Avelox.  Desat only to 88% Rx 24/7 oxygen.  Still uses oxygen at night 2L.  Currently has a cough clear to dark mucus.  Pt keeps supply of avelox on hand. Has a flutter and uses.  Still some DOE but at baseline  Current Medications, Allergies, Complete Past Medical History, Past Surgical History, Family History, and Social History were reviewed in [employer] record.  Past Medical History:  Bronchiectasis  - CT 11/07/08: Bronchiectasis in the lingular > rml plus small HH  - Sinus CT neg 04/17/09  - HFA 25 > 75% effective April 15, 2009  -PFT's 09/16/2010   FEV1  .89( 55%)  Ratio 49  No better with B2,  DLCO 62 > improved to 107 Pneumonia > cleared radiographically 07/01/2010     - Pneumovax March 2011  Allergic Rhinitis  Pulmonary hemorrhage 1979   Osteoarthritis      - s/p  L TKR 02/2000 >>  Difficult recovery Hypothyroidism  GERD    Review of Systems Ros taken in detail no positives except per HPI    Objective:   Physical Exam Filed Vitals:   05/08/13 1501  BP: 114/60  Pulse: 69  Temp: 97.9 F (36.6 C)  TempSrc: Oral  Height: 5\' 3"  (1.6 m)  Weight: 132 lb (59.875 kg)  SpO2: 95%    Gen: Pleasant, well-nourished, in no distress,  normal affect  ENT: No lesions,  mouth clear,  oropharynx clear, no postnasal drip  Neck: No JVD, no TMG, no carotid bruits  Lungs: No use of accessory muscles, no dullness to percussion, few dry rales, exp wheezes  Cardiovascular: RRR, heart sounds normal, no murmur or gallops, no peripheral edema  Abdomen: soft and NT, no HSM,  BS normal  Musculoskeletal: No deformities, no cyanosis  or clubbing  Neuro: alert, non focal  Skin: Warm, no lesions or rashes  No results found.  Ct chest 02/2013: Lungs/Pleura: Mild diffuse bronchial wall thickening. Patchy areas of cylindrical and mild varicose bronchiectasis, most pronounced in the inferior segment of the lingula and in the right middle lobe. These areas are associated with some peribronchovascular micro and macronodularity, most compatible with areas  of mucoid impaction. In addition, there are several small calcifications associated with the varicose bronchiectasis in the inferior segment of the lingula, compatible with broncholiths. No acute consolidative airspace disease. No pleural effusions. No other larger more suspicious appearing pulmonary nodules or masses are identified at this time.     Assessment & Plan:   BRONCHIECTASIS with mod severe airflow obst Stable Bronchiectasis, with ambulatory desaturation and nocturnal desaturation Former M Wert Patient  [place],  Last seen 03/2013 Prior studies as below - CT 11/07/08: Bronchiectasis in the lingula  > rml plus small HH  - Sinus CT neg 04/17/09  - HFA   75% p coaching 08/10/2012  - -PFT's 09/16/2010   FEV1  .89( 55%)  Ratio 49  No better with B2,  DLCO 62 > improved to 107 - PFTs  08/10/2012    FEV1  0.78 (50%) ratio 58,  No better p B2  DLCO 55 improved to 86% - Alpha one AT genotype 07/07/10: MM CT chest>   03/16/13 bronchiectasis with nodules  Plan Maintain Flutter valve Maintain BD neb meds Maintain oxygen 2L rest/QHS and 2-3 L exertion, needs to use portable system more       Updated Medication List Outpatient Encounter Prescriptions as of 05/08/2013  Medication Sig  . albuterol (PROAIR HFA) 108 (90 BASE) MCG/ACT inhaler Inhale 2 puffs into the lungs every 6 (six) hours as needed for wheezing.  Marland Kitchen. arformoterol (BROVANA) 15 MCG/2ML NEBU Take 2 mLs (15 mcg total) by nebulization 2 (two) times daily.  . Ascorbic Acid (VITAMIN C) 500 MG tablet Take by  mouth daily.    Marland Kitchen. aspirin 81 MG tablet Take by mouth daily.    Marland Kitchen. BIOTIN PO Take 1,000 mg by mouth daily.  . budesonide (PULMICORT) 0.5 MG/2ML nebulizer solution Take 2 mLs (0.5 mg total) by nebulization 2 (two) times daily.  . calcium-vitamin D (OSCAL WITH D) 500-200 MG-UNIT per tablet Take 1 tablet by mouth daily with breakfast.   . co-enzyme Q-10 30 MG capsule Take by mouth daily.    . fluticasone (FLONASE) 50 MCG/ACT nasal spray 1 spray by Nasal route at bedtime as needed.   Marland Kitchen. guaiFENesin (MUCINEX) 600 MG 12 hr tablet Take 600 mg by mouth as needed.   Marland Kitchen. levothyroxine (SYNTHROID, LEVOTHROID) 50 MCG tablet Take by mouth daily.    . Multiple Vitamin (MULTIVITAMIN) tablet Take 1 tablet by mouth daily.  . Multiple Vitamins-Minerals (PRESERVISION AREDS 2 PO) Take by mouth daily.    . pantoprazole (PROTONIX) 40 MG tablet Take 30-60 minutes before the 1st meal of the day  . polyethylene glycol powder (MIRALAX) powder Take 17 g by mouth daily as needed.    . ranitidine (ZANTAC) 150 MG tablet Take by mouth at bedtime.    Marland Kitchen. Respiratory Therapy Supplies (FLUTTER) DEVI Use as directed  . traMADol-acetaminophen (ULTRACET) 37.5-325 MG per tablet Take 1-2 tablets by mouth every 6 (six) hours as needed.    . travoprost, benzalkonium, (TRAVATAN) 0.004 % ophthalmic solution Place 1 drop into both eyes at bedtime.    . zoledronic acid (RECLAST) 5 MG/100ML SOLN injection Inject 5 mg into the vein once.  . [DISCONTINUED] moxifloxacin (AVELOX) 400 MG tablet Take 1 tablet (400 mg total) by mouth daily.  . [DISCONTINUED] Multiple Vitamins-Minerals (CENTRUM SILVER PO) Take by mouth daily.    . [DISCONTINUED] predniSONE (DELTASONE) 10 MG tablet Take  4 each am x 2 days,   2 each am x 2 days,  1 each  am x 2 days and stop

## 2013-05-22 ENCOUNTER — Telehealth: Payer: Self-pay | Admitting: Critical Care Medicine

## 2013-05-22 MED ORDER — AZITHROMYCIN 250 MG PO TABS: ORAL_TABLET | ORAL | Status: DC

## 2013-05-22 NOTE — Telephone Encounter (Signed)
rx azithromycin 250mg  Take two once then one daily until gone #6

## 2013-05-22 NOTE — Telephone Encounter (Signed)
Spoke with pt and advised that rx for Azithromycin was sent to pharmacy.

## 2013-05-22 NOTE — Telephone Encounter (Signed)
Pt c/o prod cough ( yellow) for past 3 days.  Started Avelox yesterday but started having extreme itching on hands and arms.  Went to ED and given IV Benadryl.  Allergy list updated.  Pt requesting another abx for prod cough(yellow), SOB.  Denies fever.  Please advise Allergies  Allergen Reactions  . Amoxicillin     REACTION: hives  . Avelox [Moxifloxacin Hcl In Nacl] Itching  . Iron     Per pt can only tolerate iron injection  . Levaquin [Levofloxacin In D5w] Nausea Only  . Statins Other (See Comments)    Joint pain  . Symbicort [Budesonide-Formoterol Fumarate]     Pt reports she cannot take this d/t having glaucoma.  Has tried this and it affected eyes (eye pain).

## 2013-08-07 ENCOUNTER — Ambulatory Visit (INDEPENDENT_AMBULATORY_CARE_PROVIDER_SITE_OTHER): Payer: Medicare Other | Admitting: Critical Care Medicine

## 2013-08-07 ENCOUNTER — Encounter: Payer: Self-pay | Admitting: Critical Care Medicine

## 2013-08-07 VITALS — BP 110/64 | HR 65 | Temp 98.5°F | Ht 63.0 in | Wt 131.8 lb

## 2013-08-07 DIAGNOSIS — J479 Bronchiectasis, uncomplicated: Secondary | ICD-10-CM

## 2013-08-07 DIAGNOSIS — Z23 Encounter for immunization: Secondary | ICD-10-CM

## 2013-08-07 NOTE — Progress Notes (Signed)
Subjective:    Patient ID: Lacey Stanton, female    DOB: [birth year], 80 y.o.   MRN: [id number]  HPI  8885  yowf never smoker carries dx of bronchiectasis since the 10480's.    History of Present Illness  06/29/2012 f/u ov/Wert re bronchiectasis on 02 just at hs and bud/brovana bid maint rx Chief Complaint  Patient presents with  . Follow-up    Pt c/o increased SOB for the past month. She feels that nebs are not lasting long enough any longer.   at baseline maintaining brovana budesonide and rarely needing saba hfa Then one month pta fever no cough but sob and increased proaire and used 02  > ov with Dr Martha Clanwhyte rx Avelox and fever resolved but not breathing to her satisfaction and using proaire 2 x daily but no neb saba. Doe x > slow adls rec increase zantac (ranitidine) to 150 mg after bfast and after supper  GERD  diet Continue to use the nebulizer brovana and budesonide and then 12 hours later. Prednisone 10 mg take  4 each am x 2 days,   2 each am x 2 days,  1 each am x2days and stop  Work on inhaler technique    Only use your albuterol (proaire) as rescue  08/10/2012 f/u ov/Wert re bronchiectasis FEV1  0.78 (50%) / uses 02 just at hs Chief Complaint  Patient presents with  . Followup with PFT    Pt states her breathing is back at baseline. She c/o occ wheezing.   on laba/ics with am mucus white  Rare need for saba, not limted from desired activities  Rec No change in treatment plan Use flutter as mucinex as needed If mucus gets nasty > avelox 400 mg x 10 days     11/08/2012 f/u ov/Wert re: bronchiectasis, has not used avelox Chief Complaint  Patient presents with  . Follow-up    Pt states that her breathing is doing well, and has minimal cough-after uses nebs- prod with white sputum.  Walking around new complex 20 min s stopping or ex up to an hour s sob or need for saba  rec No change rx  02/06/2013 f/u ov/Wert re: bronchiectasis on maint brovana/ bud plus prn saba  hfa Chief Complaint  Patient presents with  . Follow-up    Pt states doing well and denies any new co's today.   typically more congested after lunch and feels her self wheezing so uses albuterol but not really helping much and  not using mucinex much  At all nor the flutter. Not limited by breathing though from desired activities rec Pantoprazole (protonix) 40 mg   Take 30-60 min before first meal of the day and Zantac 150  mg one @ bedtime until return to office - this is the best way to tell whether stomach acid is contributing to your problem.   GERD diet For cough > flutter valve and mucinex or mucinex dm up 1200 mg every 12hours  Relax and do purse lip breath breathing as much as possible.   03/28/2013 f/u ov/Wert re: bronchiectasis/severe airflow obst maint on brovana/bud  Chief Complaint  Patient presents with  . Follow-up    Pt here at the request of Cecelia ByarsKelly Mosher. Pt has noticed increased SOB over the past couple of months. She had recent CT Chest that was abnormal. She has been using her o2 more, and has needed to use her rescue inhaler several times per day.   since episode  in Dec 2014 no longer walking the neighborhood, not even walking to [place], no ex Ok lying flat and no excess mucus in am/ cough is less and mostly dry   Using proair maybe once a day around noon    No obvious daytime variabilty or assoc  cp or chest tightness, subjective wheeze overt sinus or hb symptoms. No unusual exp hx or h/o childhood pna/ asthma or premature birth to her knowledge.    Sleeping ok without nocturnal  or early am exacerbation  of respiratory  c/o's or need for noct saba. Also denies any obvious fluctuation of symptoms with weather or environmental changes or other aggravating or alleviating factors except as outlined above   05/08/2013 Chief Complaint  Patient presents with  . 1 month follow up    former MW pt.  Reports she was started on o2 24/7 during last OV with Dr. Sherene Sires.  Feels  breathing has improved since this visit.  ? need for o2 24/7 now.  DOE at baseline.  Has wheezing and prod cough with dark clear to clear mucus.   MW pt wants to tfr to [facility] Dx Bronchiectasis , was put on 24/7 O2 03/2013, pt did not desire to stay on this. Pt had gotten worse since 12/2012, coughing more and dyspnea.  Rx pred pulse and Avelox.  Desat only to 88% Rx 24/7 oxygen.  Still uses oxygen at night 2L.  Currently has a cough clear to dark mucus.  Pt keeps supply of avelox on hand. Has a flutter and uses.  Still some DOE but at baseline  08/07/2013 Chief Complaint  Patient presents with  . Follow-up    sob-same.wheezing,occass.cough-clear,denies cp or tightness, no fcs,face turns red at times feels flushed no dizziness,wears 2L O2 at hs,using flutter valve occass.  Stable Bronchiectasis, with ambulatory desaturation and nocturnal desaturation  Former [facility] Patient from [place], Last seen 03/2013  Prior studies as below  - CT 11/07/08: Bronchiectasis in the lingula > rml plus small HH  - Sinus CT neg 04/17/09  - HFA 75% p coaching 08/10/2012  - -PFT's 09/16/2010 FEV1 .89( 55%) Ratio 49 No better with B2, DLCO 62 > improved to 107  - PFTs 08/10/2012 FEV1 0.78 (50%) ratio 58, No better p B2 DLCO 55 improved to 86%  - Alpha one AT genotype 07/07/10: MM  CT chest> 03/16/13 bronchiectasis with nodules  Plan  Maintain Flutter valve  Maintain BD neb meds  Maintain oxygen 2L rest/QHS and 2-3 L exertion, needs to use portable system more   No changes since 04/2013.  Not much mucus daily.  Uses neb med bid Brovana/budesonide.     No real chest pain.     Current Medications, Allergies, Complete Past Medical History, Past Surgical History, Family History, and Social History were reviewed in [employer] record.  Past Medical History:  Bronchiectasis  - CT 11/07/08: Bronchiectasis in the lingular > rml plus small HH  - Sinus CT neg 04/17/09  - HFA 25 > 75% effective April 15, 2009  -PFT's 09/16/2010   FEV1  .89( 55%)  Ratio 49  No better with B2,  DLCO 62 > improved to 107 Pneumonia > cleared radiographically 07/01/2010     - Pneumovax March 2011  Allergic Rhinitis  Pulmonary hemorrhage 1979   Osteoarthritis      - s/p  L TKR 02/2000 >>  Difficult recovery Hypothyroidism  GERD    Review of Systems  Ros taken in detail  no positives except per HPI    Objective:   Physical Exam  Filed Vitals:   08/07/13 0945  BP: 110/64  Pulse: 65  Temp: 98.5 F (36.9 C)  TempSrc: Oral  Height: 5\' 3"  (1.6 m)  Weight: 131 lb 12.8 oz (59.784 kg)  SpO2: 94%    Gen: Pleasant, well-nourished, in no distress,  normal affect  ENT: No lesions,  mouth clear,  oropharynx clear, no postnasal drip  Neck: No JVD, no TMG, no carotid bruits  Lungs: No use of accessory muscles, no dullness to percussion, few dry rales, exp wheezes  Cardiovascular: RRR, heart sounds normal, no murmur or gallops, no peripheral edema  Abdomen: soft and NT, no HSM,  BS normal  Musculoskeletal: No deformities, no cyanosis or clubbing  Neuro: alert, non focal  Skin: Warm, no lesions or rashes  No results found.  Ct chest 02/2013: Lungs/Pleura: Mild diffuse bronchial wall thickening. Patchy areas of cylindrical and mild varicose bronchiectasis, most pronounced in the inferior segment of the lingula and in the right middle lobe. These areas are associated with some peribronchovascular micro and macronodularity, most compatible with areas of mucoid impaction. In addition, there are several small calcifications associated with the varicose bronchiectasis in the inferior segment of the lingula, compatible with broncholiths. No acute consolidative airspace disease. No pleural effusions. No other larger more suspicious appearing pulmonary nodules or masses are identified at this time.     Assessment & Plan:   BRONCHIECTASIS with mod severe airflow obst Stable bronchiectasis without recent  exacerbations Plan prevnar 13 vaccine No other changes     Updated Medication List Outpatient Encounter Prescriptions as of 08/07/2013  Medication Sig  . albuterol (PROAIR HFA) 108 (90 BASE) MCG/ACT inhaler Inhale 2 puffs into the lungs every 6 (six) hours as needed for wheezing.  Marland Kitchen arformoterol (BROVANA) 15 MCG/2ML NEBU Take 2 mLs (15 mcg total) by nebulization 2 (two) times daily.  . Ascorbic Acid (VITAMIN C) 500 MG tablet Take by mouth daily.    Marland Kitchen aspirin 81 MG tablet Take by mouth daily.    Marland Kitchen BIOTIN PO Take 1,000 mg by mouth daily.  . budesonide (PULMICORT) 0.5 MG/2ML nebulizer solution Take 2 mLs (0.5 mg total) by nebulization 2 (two) times daily.  . calcium-vitamin D (OSCAL WITH D) 500-200 MG-UNIT per tablet Take 1 tablet by mouth daily with breakfast.   . co-enzyme Q-10 30 MG capsule Take by mouth daily.    . fluticasone (FLONASE) 50 MCG/ACT nasal spray 1 spray by Nasal route at bedtime as needed.   Marland Kitchen guaiFENesin (MUCINEX) 600 MG 12 hr tablet Take 600 mg by mouth as needed.   Marland Kitchen levothyroxine (SYNTHROID, LEVOTHROID) 50 MCG tablet Take by mouth daily.    . magnesium gluconate (MAGONATE) 500 MG tablet Take 500 mg by mouth daily.  . Multiple Vitamin (MULTIVITAMIN) tablet Take 1 tablet by mouth daily.  . Multiple Vitamins-Minerals (PRESERVISION AREDS 2 PO) Take by mouth daily.    . pantoprazole (PROTONIX) 40 MG tablet Take 30-60 minutes before the 1st meal of the day  . polyethylene glycol powder (MIRALAX) powder Take 17 g by mouth daily as needed.    . ranitidine (ZANTAC) 150 MG tablet Take by mouth at bedtime.    Marland Kitchen Respiratory Therapy Supplies (FLUTTER) DEVI Use as directed  . traMADol-acetaminophen (ULTRACET) 37.5-325 MG per tablet Take 1-2 tablets by mouth every 6 (six) hours as needed.    . zoledronic acid (RECLAST) 5 MG/100ML SOLN injection Inject 5  mg into the vein once.  . [DISCONTINUED] azithromycin (ZITHROMAX) 250 MG tablet Take 2 tablets today then 1 tablet daily until gone   . [DISCONTINUED] travoprost, benzalkonium, (TRAVATAN) 0.004 % ophthalmic solution Place 1 drop into both eyes at bedtime.

## 2013-08-07 NOTE — Assessment & Plan Note (Signed)
Stable bronchiectasis without recent exacerbations Plan prevnar 13 vaccine No other changes

## 2013-08-07 NOTE — Patient Instructions (Addendum)
No change in medications Stay on nebulizer twice a day A prevnar 13 vaccine will be given Return 4 months  If next exacerbation occurs I would use either cefuroxime or azithromycin

## 2013-08-29 ENCOUNTER — Other Ambulatory Visit: Payer: Self-pay | Admitting: Critical Care Medicine

## 2013-08-29 ENCOUNTER — Telehealth: Payer: Self-pay | Admitting: Critical Care Medicine

## 2013-08-29 NOTE — Telephone Encounter (Signed)
RX was sent in today by nurse.  Pt aware nothing further needed

## 2014-01-08 ENCOUNTER — Ambulatory Visit: Payer: Medicare Other | Admitting: Critical Care Medicine

## 2014-01-08 ENCOUNTER — Ambulatory Visit (INDEPENDENT_AMBULATORY_CARE_PROVIDER_SITE_OTHER): Payer: Medicare Other | Admitting: Critical Care Medicine

## 2014-01-08 ENCOUNTER — Encounter: Payer: Self-pay | Admitting: Critical Care Medicine

## 2014-01-08 VITALS — BP 108/60 | HR 77 | Temp 99.3°F | Ht 62.5 in | Wt 133.2 lb

## 2014-01-08 DIAGNOSIS — J471 Bronchiectasis with (acute) exacerbation: Secondary | ICD-10-CM

## 2014-01-08 DIAGNOSIS — J479 Bronchiectasis, uncomplicated: Secondary | ICD-10-CM

## 2014-01-08 DIAGNOSIS — J189 Pneumonia, unspecified organism: Secondary | ICD-10-CM

## 2014-01-08 NOTE — Patient Instructions (Signed)
Take probiotic twice daily and yogurt at least daily until off levaquin and continue at least two more days then can reduce to daily on probiotic No change in inhalers Stay on flutter valve Chest xray today Return 2 months

## 2014-01-08 NOTE — Progress Notes (Signed)
Subjective:    Patient ID: Lacey LankHelen G Baltes, female    DOB: Sep 24, 1926, 80 y.o.   MRN: [id number]  HPI 9585  yowf never smoker carries dx of bronchiectasis since the 980's.    History of Present Illness  01/08/2014 Chief Complaint  Patient presents with  . Follow-up    [facility] 5 days ago pnuemonia,sob-getting some better,cough-dry occass.,no wheezing,denies cp or tightness,mild fever 99 last night  Pt just in hosp at [facility] days prior to OV. Dx PNA.   Symptoms : pt turned over on right side and developed severe pain. Pna on Right side. Same side as before.  No real mucus. No real fever.  IV ABX, and d/c on ABX three days left.  Notes is some better.  Pt is weak, notes low grade fever.  No real mucus. Pain is gone.  Dyspnea is the same. Pt is on oxygen at home.   Using flutter valve and helps.    Current Medications, Allergies, Complete Past Medical History, Past Surgical History, Family History, and Social History were reviewed in [facility] Link electronic medical record.  Past Medical History:  Bronchiectasis  - CT 11/07/08: Bronchiectasis in the lingular > rml plus small HH  - Sinus CT neg 04/17/09  - HFA 25 > 75% effective April 15, 2009  -PFT's 09/16/2010   FEV1  .89( 55%)  Ratio 49  No better with B2,  DLCO 62 > improved to 107 Pneumonia > cleared radiographically 07/01/2010     - Pneumovax March 2011  Allergic Rhinitis  Pulmonary hemorrhage 1979   Osteoarthritis      - s/p  L TKR 02/2000 >>  Difficult recovery Hypothyroidism  GERD    Review of Systems Ros taken in detail no positives except per HPI    Objective:   Physical Exam Filed Vitals:   01/08/14 1446  BP: 108/60  Pulse: 77  Temp: 99.3 F (37.4 C)  TempSrc: Oral  Height: 5' 2.5" (1.588 m)  Weight: 133 lb 3.2 oz (60.419 kg)  SpO2: 93%    Gen: Pleasant, well-nourished, in no distress,  normal affect  ENT: No lesions,  mouth clear,  oropharynx clear, no postnasal drip  Neck: No JVD, no  TMG, no carotid bruits  Lungs: No use of accessory muscles, no dullness to percussion, few dry rales, no wheezes  Cardiovascular: RRR, heart sounds normal, no murmur or gallops, no peripheral edema  Abdomen: soft and NT, no HSM,  BS normal  Musculoskeletal: No deformities, no cyanosis or clubbing  Neuro: alert, non focal  Skin: Warm, no lesions or rashes  No results found.  Recent chest x-rays from hospitalization reviewed    Assessment & Plan:   BRONCHIECTASIS with mod severe airflow obst Bronchiectasis in the lingula and right middle lobe with associated hiatal hernia and probability very high of Mycobacterium avium colonization however no positive microbiologic data yet discerned Recent right middle lobe community acquired pneumonia now resolving with hospitalization Plan Finish current course of Levaquin No change in inhalers Stay on flutter valve   Pneumonia Recent right middle lobe community acquired pneumonia with hospitalization December 2015 now patient appears to be improving Plan Finish current course of antibiotics Follow-up chest x-ray    Updated Medication List Outpatient Encounter Prescriptions as of 01/08/2014  Medication Sig  . albuterol (PROAIR HFA) 108 (90 BASE) MCG/ACT inhaler Inhale 2 puffs into the lungs every 6 (six) hours as needed for wheezing.  Marland Kitchen. arformoterol (BROVANA) 15 MCG/2ML NEBU Take  2 mLs (15 mcg total) by nebulization 2 (two) times daily.  Marland Kitchen. BIOTIN PO Take 1,000 mg by mouth daily.  . budesonide (PULMICORT) 0.5 MG/2ML nebulizer solution Take 2 mLs (0.5 mg total) by nebulization 2 (two) times daily.  . calcium-vitamin D (OSCAL WITH D) 500-200 MG-UNIT per tablet Take 1 tablet by mouth daily with breakfast.   . co-enzyme Q-10 30 MG capsule Take by mouth daily.    . fluticasone (FLONASE) 50 MCG/ACT nasal spray 1 spray by Nasal route at bedtime as needed.   Marland Kitchen. guaiFENesin (MUCINEX) 600 MG 12 hr tablet Take 600 mg by mouth as needed.   Marland Kitchen.  levofloxacin (LEVAQUIN) 500 MG tablet Take 500 mg by mouth daily. Take 1 tablet by mouth daily  . levothyroxine (SYNTHROID, LEVOTHROID) 50 MCG tablet Take by mouth daily.    . Multiple Vitamin (MULTIVITAMIN) tablet Take 1 tablet by mouth daily.  . Multiple Vitamins-Minerals (PRESERVISION AREDS 2 PO) Take by mouth daily.    . pantoprazole (PROTONIX) 40 MG tablet Take 1 tablet 30-60 minutes before the 1st meal of the day  . polyethylene glycol powder (MIRALAX) powder Take 17 g by mouth daily as needed.    . ranitidine (ZANTAC) 150 MG tablet Take by mouth at bedtime.    Marland Kitchen. Respiratory Therapy Supplies (FLUTTER) DEVI Use as directed  . traMADol-acetaminophen (ULTRACET) 37.5-325 MG per tablet Take 1-2 tablets by mouth every 6 (six) hours as needed.    . VOLTAREN 1 % GEL As needed  . zoledronic acid (RECLAST) 5 MG/100ML SOLN injection Inject 5 mg into the vein once.  . Ascorbic Acid (VITAMIN C) 500 MG tablet Take by mouth daily.    Marland Kitchen. aspirin 81 MG tablet Take by mouth daily.    . magnesium gluconate (MAGONATE) 500 MG tablet Take 500 mg by mouth daily.

## 2014-01-09 NOTE — Assessment & Plan Note (Signed)
Recent right middle lobe community acquired pneumonia with hospitalization December 2015 now patient appears to be improving Plan Doreatha MartinFinish current course of antibiotics Follow-up chest x-ray

## 2014-01-09 NOTE — Assessment & Plan Note (Signed)
Bronchiectasis in the lingula and right middle lobe with associated hiatal hernia and probability very high of Mycobacterium avium colonization however no positive microbiologic data yet discerned Recent right middle lobe community acquired pneumonia now resolving with hospitalization Plan Finish current course of Levaquin No change in inhalers Stay on flutter valve

## 2014-01-10 ENCOUNTER — Inpatient Hospital Stay: Payer: Medicare Other | Admitting: Adult Health

## 2014-01-11 ENCOUNTER — Telehealth: Payer: Self-pay | Admitting: Critical Care Medicine

## 2014-01-11 DIAGNOSIS — J189 Pneumonia, unspecified organism: Secondary | ICD-10-CM

## 2014-01-11 NOTE — Telephone Encounter (Signed)
Let pt know CXR Normal.  No pneumonia seen

## 2014-01-14 NOTE — Telephone Encounter (Signed)
Try ibuprofen 800mg  tid x 3 days

## 2014-01-14 NOTE — Telephone Encounter (Signed)
Pt aware of recs.  Nothing further needed. 

## 2014-01-14 NOTE — Telephone Encounter (Signed)
Called, spoke with pt.  Discussed cxr results per Dr. Delford FieldWright.  Pt verbalized understanding. She c/o continuous right side chest soreness x 3 days.   States cough has improved from 01/08/14 OV and is nonprod.  Denies increased SOB and wheezing. Has tried ultracet for chest soreness with no relief. Pt doesn't think she has done any activity to contribute to this and would like to know if Dr. Delford FieldWright has any further recs.  Please advise.  Thank you.  [facility]

## 2014-02-21 ENCOUNTER — Telehealth: Payer: Self-pay | Admitting: Critical Care Medicine

## 2014-02-21 MED ORDER — BUDESONIDE 0.5 MG/2ML IN SUSP: 0.5000 mg | Freq: Two times a day (BID) | RESPIRATORY_TRACT | Status: DC

## 2014-02-21 NOTE — Telephone Encounter (Signed)
Pt states that she spoke with [facility] to refill her Budesonide Nebulizer. Pt states that she was advised that our office needed to give authorization  Called and spoke with pharmacist at [facility]. Rx has been refilled. Pt aware. Nothing further needed.

## 2014-03-01 ENCOUNTER — Other Ambulatory Visit: Payer: Self-pay | Admitting: Critical Care Medicine

## 2014-03-12 ENCOUNTER — Ambulatory Visit (INDEPENDENT_AMBULATORY_CARE_PROVIDER_SITE_OTHER): Payer: Medicare Other | Admitting: Critical Care Medicine

## 2014-03-12 ENCOUNTER — Encounter: Payer: Self-pay | Admitting: Critical Care Medicine

## 2014-03-12 VITALS — BP 120/62 | HR 78 | Temp 97.8°F | Ht 62.5 in | Wt 132.0 lb

## 2014-03-12 DIAGNOSIS — J479 Bronchiectasis, uncomplicated: Secondary | ICD-10-CM

## 2014-03-12 NOTE — Progress Notes (Signed)
Subjective:    Patient ID: Lacey Stanton, female    DOB: 11/22/1926, 81 y.o.   MRN: [id number]  HPI 81 y.o. wf never smoker carries dx of bronchiectasis since the 5580's.  03/12/2014 Chief Complaint  Patient presents with  . Follow-up    Not able to walk as far without oxygen 2L,wears when out walking, not much at home except at hs.No cough,occass. wheezing.Denies cp or tightness. No fcs  Pt still with DOE, not walking distance the same as before.  Ok at home. Uses at night as well 2L.   No further hosp stays since 12/2013 hosp stay. No cough. occ wheezing. No cp. No f/c/s.  Pt denies any significant sore throat, nasal congestion or excess secretions, fever, chills, sweats, unintended weight loss, pleurtic or exertional chest pain, orthopnea PND, or leg swelling Pt denies any increase in rescue therapy over baseline, denies waking up needing it or having any early am or nocturnal exacerbations of coughing/wheezing/or dyspnea. Pt also denies any obvious fluctuation in symptoms with  weather or environmental change or other alleviating or aggravating factors    Current Medications, Allergies, Complete Past Medical History, Past Surgical History, Family History, and Social History were reviewed in [facility] electronic medical record.  Past Medical History:  Bronchiectasis  - CT 11/07/08: Bronchiectasis in the lingular > rml plus small HH  - Sinus CT neg 04/17/09  - HFA 25 > 75% effective April 15, 2009  -PFT's 09/16/2010   FEV1  .89( 55%)  Ratio 49  No better with B2,  DLCO 62 > improved to 107 Pneumonia > cleared radiographically 07/01/2010     - Pneumovax March 2011  Allergic Rhinitis  Pulmonary hemorrhage 1979   Osteoarthritis      - s/p  L TKR 02/2000 >>  Difficult recovery Hypothyroidism  GERD    Review of Systems Ros taken in detail no positives except per HPI    Objective:   Physical Exam Filed Vitals:   03/12/14 1405  BP: 120/62  Pulse: 78  Temp: 97.8 F (36.6 C)   TempSrc: Oral  Height: 5' 2.5" (1.588 m)  Weight: 132 lb (59.875 kg)  SpO2: 93%    Gen: Pleasant, well-nourished, in no distress,  normal affect  ENT: No lesions,  mouth clear,  oropharynx clear, no postnasal drip  Neck: No JVD, no TMG, no carotid bruits  Lungs: No use of accessory muscles, no dullness to percussion, few dry rales, no wheezes  Cardiovascular: RRR, heart sounds normal, no murmur or gallops, no peripheral edema  Abdomen: soft and NT, no HSM,  BS normal  Musculoskeletal: No deformities, no cyanosis or clubbing  Neuro: alert, non focal  Skin: Warm, no lesions or rashes  No results found.     Assessment & Plan:   BRONCHIECTASIS with mod severe airflow obst Bronchiectasis particularly in the left lingula with associated hiatal hernia Stable this time Chronic hypoxic respiratory failure also stable as well Plan No change in inhaled medications This patient continues to use and benefit from her oxygen therapy and has re qualified at this visit for continued oxygen therapy      Updated Medication List Outpatient Encounter Prescriptions as of 03/12/2014  Medication Sig  . albuterol (PROAIR HFA) 108 (90 BASE) MCG/ACT inhaler Inhale 2 puffs into the lungs every 6 (six) hours as needed for wheezing.  Marland Kitchen. arformoterol (BROVANA) 15 MCG/2ML NEBU Take 2 mLs (15 mcg total) by nebulization 2 (two) times daily.  . Ascorbic Acid (  VITAMIN C) 500 MG tablet Take by mouth daily.    Marland Kitchen aspirin 81 MG tablet Take by mouth daily.    Marland Kitchen b complex vitamins tablet Take 1 tablet by mouth daily.  Marland Kitchen BIOGAIA PROBIOTIC (BIOGAIA PROBIOTIC) LIQD Take by mouth daily at 8 pm.  . BIOTIN PO Take 1,000 mg by mouth daily.  . budesonide (PULMICORT) 0.5 MG/2ML nebulizer solution Take 2 mLs (0.5 mg total) by nebulization 2 (two) times daily.  . calcium-vitamin D (OSCAL WITH D) 500-200 MG-UNIT per tablet Take 1 tablet by mouth daily with breakfast.   . co-enzyme Q-10 30 MG capsule Take by mouth  daily.    . fluticasone (FLONASE) 50 MCG/ACT nasal spray 1 spray by Nasal route at bedtime as needed.   Marland Kitchen guaiFENesin (MUCINEX) 600 MG 12 hr tablet Take 600 mg by mouth as needed.   Marland Kitchen levothyroxine (SYNTHROID, LEVOTHROID) 50 MCG tablet Take by mouth daily.    . Multiple Vitamin (MULTIVITAMIN) tablet Take 1 tablet by mouth daily.  . Multiple Vitamins-Minerals (PRESERVISION AREDS 2 PO) Take by mouth daily.    . pantoprazole (PROTONIX) 40 MG tablet TAKE ONE TABLET 30-60 MINUTES BEFORE THE FIRST MEAL OF THE DAY  . polyethylene glycol powder (MIRALAX) powder Take 17 g by mouth daily as needed.    . ranitidine (ZANTAC) 150 MG tablet Take by mouth at bedtime.    Marland Kitchen Respiratory Therapy Supplies (FLUTTER) DEVI Use as directed  . traMADol-acetaminophen (ULTRACET) 37.5-325 MG per tablet Take 1-2 tablets by mouth every 6 (six) hours as needed.    . VOLTAREN 1 % GEL As needed  . zoledronic acid (RECLAST) 5 MG/100ML SOLN injection Inject 5 mg into the vein once.  . [DISCONTINUED] levofloxacin (LEVAQUIN) 500 MG tablet Take 500 mg by mouth daily. Take 1 tablet by mouth daily  . [DISCONTINUED] magnesium gluconate (MAGONATE) 500 MG tablet Take 500 mg by mouth daily.

## 2014-03-12 NOTE — Assessment & Plan Note (Signed)
Bronchiectasis particularly in the left lingula with associated hiatal hernia Stable this time Chronic hypoxic respiratory failure also stable as well Plan No change in inhaled medications This patient continues to use and benefit from her oxygen therapy and has re qualified at this visit for continued oxygen therapy

## 2014-03-12 NOTE — Patient Instructions (Signed)
No change in medications. Return in         4 months 

## 2014-05-03 ENCOUNTER — Encounter: Payer: Self-pay | Admitting: Critical Care Medicine

## 2014-06-25 ENCOUNTER — Ambulatory Visit (INDEPENDENT_AMBULATORY_CARE_PROVIDER_SITE_OTHER): Payer: Medicare Other | Admitting: Critical Care Medicine

## 2014-06-25 ENCOUNTER — Encounter: Payer: Self-pay | Admitting: Critical Care Medicine

## 2014-06-25 VITALS — BP 118/70 | HR 70 | Temp 98.3°F | Ht 62.5 in

## 2014-06-25 DIAGNOSIS — J479 Bronchiectasis, uncomplicated: Secondary | ICD-10-CM

## 2014-06-25 NOTE — Progress Notes (Signed)
Subjective:    Patient ID: Lacey Stanton, female    DOB: 1926/10/13, 81 y.o.   MRN: [id number]  HPI 06/25/2014 Chief Complaint  Patient presents with  . Follow-up    Feeling about the same,sob-same,cough-white after using nebulizer,no pnd, occass. wheezing,denies cp or tightness, no fcs    Overall doing well Pt denies any significant sore throat, nasal congestion or excess secretions, fever, chills, sweats, unintended weight loss, pleurtic or exertional chest pain, orthopnea PND, or leg swelling Pt denies any increase in rescue therapy over baseline, denies waking up needing it or having any early am or nocturnal exacerbations of coughing/wheezing/or dyspnea. Pt also denies any obvious fluctuation in symptoms with  weather or environmental change or other alleviating or aggravating factors   Current Medications, Allergies, Complete Past Medical History, Past Surgical History, Family History, and Social History were reviewed in [facility] electronic medical record per todays encounter:  06/25/2014  Review of Systems  Constitutional: Negative.   HENT: Negative.  Negative for ear pain, postnasal drip, rhinorrhea, sinus pressure, sore throat, trouble swallowing and voice change.   Eyes: Negative.   Respiratory: Negative.  Negative for apnea, cough, choking, chest tightness, shortness of breath, wheezing and stridor.   Cardiovascular: Negative.  Negative for chest pain, palpitations and leg swelling.  Gastrointestinal: Negative.  Negative for nausea, vomiting, abdominal pain and abdominal distention.  Genitourinary: Negative.   Musculoskeletal: Negative.  Negative for myalgias and arthralgias.  Skin: Negative.  Negative for rash.  Allergic/Immunologic: Negative.  Negative for environmental allergies and food allergies.  Neurological: Negative.  Negative for dizziness, syncope, weakness and headaches.  Hematological: Negative.  Negative for adenopathy. Does not bruise/bleed easily.    Psychiatric/Behavioral: Negative.  Negative for sleep disturbance and agitation. The patient is not nervous/anxious.        Objective:   Physical Exam    Filed Vitals:   06/25/14 1427  BP: 118/70  Pulse: 70  Temp: 98.3 F (36.8 C)  TempSrc: Oral  Height: 5' 2.5" (1.588 m)  SpO2: 94%    Gen: Pleasant, well-nourished, in no distress,  normal affect  ENT: No lesions,  mouth clear,  oropharynx clear, no postnasal drip  Neck: No JVD, no TMG, no carotid bruits  Lungs: No use of accessory muscles, no dullness to percussion, few squeaks RML L lingular area  Cardiovascular: RRR, heart sounds normal, no murmur or gallops, no peripheral edema  Abdomen: soft and NT, no HSM,  BS normal  Musculoskeletal: No deformities, no cyanosis or clubbing  Neuro: alert, non focal  Skin: Warm, no lesions or rashes  No results found.       Assessment & Plan:  I personally reviewed all images and lab data [facility] as well as any outside material available during this office visit and agree with the  radiology impressions.   BRONCHIECTASIS with mod severe airflow obst Stable bronchiectasis bilateral in right middle and left lingular areas  CT 11/07/08: Bronchiectasis in the lingula  > rml plus small HH  - Sinus CT neg 04/17/09  - HFA   75% p coaching 08/10/2012  - -PFT's 09/16/2010   FEV1  .89( 55%)  Ratio 49  No better with B2,  DLCO 62 > improved to 107 - PFTs  08/10/2012    FEV1  0.78 (50%) ratio 58,  No better p B2  DLCO 55 improved to 86% - Alpha one AT genotype 07/07/10: MM CT chest>   03/16/13 bronchiectasis with nodules  Plan Maintain inhaled medications as prescribed    Marlaya was seen today for follow-up.  Diagnoses and all orders for this visit:  Bronchiectasis without complication

## 2014-06-25 NOTE — Patient Instructions (Signed)
No change in inhaled or maintenance medications. Return in  4 months 

## 2014-06-26 NOTE — Assessment & Plan Note (Signed)
Stable bronchiectasis bilateral in right middle and left lingular areas  CT 11/07/08: Bronchiectasis in the lingula  > rml plus small HH  - Sinus CT neg 04/17/09  - HFA   75% p coaching 08/10/2012  - -PFT's 09/16/2010   FEV1  .89( 55%)  Ratio 49  No better with B2,  DLCO 62 > improved to 107 - PFTs  08/10/2012    FEV1  0.78 (50%) ratio 58,  No better p B2  DLCO 55 improved to 86% - Alpha one AT genotype 07/07/10: MM CT chest>   03/16/13 bronchiectasis with nodules    Plan Maintain inhaled medications as prescribed

## 2014-10-18 ENCOUNTER — Telehealth: Payer: Self-pay | Admitting: Critical Care Medicine

## 2014-10-18 NOTE — Telephone Encounter (Signed)
Called spoke with pt. She scheduled appt with MW in [place] for next week for follow up. Nothing further needed

## 2014-10-22 ENCOUNTER — Ambulatory Visit (INDEPENDENT_AMBULATORY_CARE_PROVIDER_SITE_OTHER)
Admission: RE | Admit: 2014-10-22 | Discharge: 2014-10-22 | Disposition: A | Discharge status: Home / Self Care | Payer: Medicare Other | Source: Ambulatory Visit | Attending: Internal Medicine | Admitting: Internal Medicine

## 2014-10-22 ENCOUNTER — Ambulatory Visit (INDEPENDENT_AMBULATORY_CARE_PROVIDER_SITE_OTHER): Payer: Medicare Other | Admitting: Internal Medicine

## 2014-10-22 ENCOUNTER — Encounter: Payer: Self-pay | Admitting: Internal Medicine

## 2014-10-22 VITALS — BP 138/64 | HR 70 | Ht 62.5 in | Wt 128.6 lb

## 2014-10-22 DIAGNOSIS — J479 Bronchiectasis, uncomplicated: Secondary | ICD-10-CM

## 2014-10-22 DIAGNOSIS — R0902 Hypoxemia: Secondary | ICD-10-CM | POA: Diagnosis not present

## 2014-10-22 DIAGNOSIS — J471 Bronchiectasis with (acute) exacerbation: Secondary | ICD-10-CM

## 2014-10-22 MED ORDER — CIPROFLOXACIN HCL 750 MG PO TABS: 750.0000 mg | ORAL_TABLET | Freq: Two times a day (BID) | ORAL | Status: DC

## 2014-10-22 MED ORDER — PREDNISONE 10 MG PO TABS: ORAL_TABLET | ORAL | Status: DC

## 2014-10-22 NOTE — Patient Instructions (Signed)
Prednisone 10 mg take  4 each am x 2 days,   2 each am x 2 days,  1 each am x 2 days and stop   For breathing: Proair 2 puffs up to every 4 h as needed   For coughing: mucinex 600 mg up to 2 every 12 hours as needed and use flutter valve as much as possible   For nasty mucus: cipro 750 twice daily x 7days     Please remember to go to the x-ray department downstairs for your tests - we will call you with the results when they are available.  Please schedule a follow up office visit in 6 weeks, call sooner if needed with pfts

## 2014-10-22 NOTE — Progress Notes (Signed)
Subjective:    Patient ID: Lacey Stanton, female    DOB: [birth year]   MRN: [id number]   Brief patient profile:  64  yowf never smoker with obstructive  bronchiectasis with symptoms dating back to  the 80's.   History of Present Illness  06/29/2012 f/u ov/Wert re bronchiectasis on 02 just at hs and bud/brovana bid maint rx Chief Complaint  Patient presents with  . Follow-up    Pt c/o increased SOB for the past month. She feels that nebs are not lasting long enough any longer.   at baseline maintaining brovana budesonide and rarely needing saba hfa Then one month pta fever no cough but sob and increased proaire and used 02  > ov with Dr Martha Clan rx Avelox and fever resolved but not breathing to her satisfaction and using proaire 2 x daily but no neb saba. Doe x > slow adls rec increase zantac (ranitidine) to 150 mg after bfast and after supper  GERD  diet Continue to use the nebulizer brovana and budesonide and then 12 hours later. Prednisone 10 mg take  4 each am x 2 days,   2 each am x 2 days,  1 each am x2days and stop  Work on inhaler technique    Only use your albuterol (proaire) as rescue      02/06/2013 f/u ov/Wert re: bronchiectasis on maint brovana/ bud plus prn saba hfa Chief Complaint  Patient presents with  . Follow-up    Pt states doing well and denies any new co's today.   typically more congested after lunch and feels her self wheezing so uses albuterol but not really helping much and  not using mucinex much  At all nor the flutter. Not limited by breathing though from desired activities rec Pantoprazole (protonix) 40 mg   Take 30-60 min before first meal of the day and Zantac 150  mg one @ bedtime until return to office - this is the best way to tell whether stomach acid is contributing to your problem.   GERD diet For cough > flutter valve and mucinex or mucinex dm up 1200 mg every 12hours  Relax and do purse lip breath breathing as much as possible.    03/28/2013  f/u ov/Wert re: bronchiectasis/severe airflow obst maint on brovana/bud  Chief Complaint  Patient presents with  . Follow-up    Pt here at the request of Cecelia Byars. Pt has noticed increased SOB over the past couple of months. She had recent CT Chest that was abnormal. She has been using her o2 more, and has needed to use her rescue inhaler several times per day.   since episode in Dec 2014 no longer walking the neighborhood, not even walking to [place], no ex Ok lying flat and no excess mucus in am/ cough is less and mostly dry   Using proair maybe once a day around noon  rec Prednisone 10 mg take  4 each am x 2 days,   2 each am x 2 days,  1 each am x 2 days and stop  Work on inhaler technique:  relax and gently blow all the way out then take a nice smooth deep breath back in, triggering the inhaler at same time you start breathing in.  Hold for up to 5 seconds if you can.  Rinse and gargle with water when done. For breathing: Proair 2 puffs up to every 4 h as needed  For coughing: mucinex 600 mg up to 2 every 12  hours as needed  For nasty mucus: Avelox 400 mg one daily x 5 days     10/22/2014  f/u ov/Wert re: obstructive bronchiectasis on perf/bud bid and 02 hs and with exertion / off all gerd rx due to hearing about ppi  Chief Complaint  Patient presents with  . Follow-up    Pt c/o increased SOB for the past 6 wks. She states that she is not coughing more than usual, but seems harder to produce sputum.    only change in rx was stopping gerd rx, still compliant with nebs / rarely using saba  No obvious day to day or daytime variability or assoc excess or purulent sputum  or cp or chest tightness, subjective wheeze or overt sinus or hb symptoms. No unusual exp hx or h/o childhood pna/ asthma or knowledge of premature birth.  Sleeping ok without nocturnal  or early am exacerbation  of respiratory  c/o's or need for noct saba. Also denies any obvious fluctuation of symptoms with  weather or environmental changes or other aggravating or alleviating factors except as outlined above   Current Medications, Allergies, Complete Past Medical History, Past Surgical History, Family History, and Social History were reviewed in [employer] record.  ROS  The following are not active complaints unless bolded sore throat, dysphagia, dental problems, itching, sneezing,  nasal congestion or excess/ purulent secretions, ear ache,   fever, chills, sweats, unintended wt loss, classically pleuritic or exertional cp, hemoptysis,  orthopnea pnd or leg swelling, presyncope, palpitations, abdominal pain, anorexia, nausea, vomiting, diarrhea  or change in bowel or bladder habits, change in stools or urine, dysuria,hematuria,  rash, arthralgias, visual complaints, headache, numbness, weakness or ataxia or problems with walking or coordination,  change in mood/affect or memory.            Past Medical History:  Bronchiectasis  - CT 11/07/08: Bronchiectasis in the lingular > RML plus small HH  - Sinus CT neg 04/17/09  - HFA 25 > 75% effective April 15, 2009  -PFT's 09/16/2010   FEV1  .89( 55%)  Ratio 49  No better with B2,  DLCO 62 > improved to 107 Pneumonia > cleared radiographically 07/01/2010     - Pneumovax March 2011  Allergic Rhinitis  Pulmonary hemorrhage 1979   Osteoarthritis      - s/p  L TKR 02/2000 >>  Difficult recovery Hypothyroidism  GERD                Objective:   Physical Exam  Edentulous pleasant amb wf nad  / vital signs reviewed   Wt 125 08/03/2010  > 122 09/16/2010  > 128 12/09/2010 > 03/15/2011  133> 6/52014  129> 11/08/2012 131 >  10/22/2014   129   GEN: A/Ox3; pleasant , NAD, elderly .  HEENT:  [place]/AT,  EACs-clear, TMs-wnl, NOSE-clear, THROAT-clear, no lesions, no postnasal drip or exudate noted.   NECK:  Supple w/ fair ROM; no JVD; normal carotid impulses w/o bruits; no thyromegaly or nodules palpated; no lymphadenopathy.  RESP  no  accessory muscle use, no dullness to percussion.   Sonorous  insp and exp rhonchi bilaterally   CARD:  RRR, no m/r/g  , no peripheral edema, pulses intact, no cyanosis or clubbing.  GI:   Soft & nt; nml bowel sounds; no organomegaly or masses detected.  Musc:  Warm bil, no deformities or joint swelling noted.      I personally reviewed images and agree with radiology impression  as follows:  CXR:  10/22/2014    The lungs are hyperinflated likely secondary to COPD. There is no focal parenchymal opacity. There is no pleural effusion or pneumothorax. The heart and mediastinal contours are unremarkable  Assessment & Plan:

## 2014-10-22 NOTE — Assessment & Plan Note (Addendum)
-   CT 11/07/08: Bronchiectasis in the lingula  > rml plus small HH  - Sinus CT neg 04/17/09   -PFT's 09/16/2010   FEV1  .89( 55%)  Ratio 49  No better with B2,  DLCO 62 > improved to 107 - PFTs  08/10/2012    FEV1  0.78 (50%) ratio 58,  No better p B2  DLCO 55 improved to 86% - Alpha one AT genotype 07/07/10: MM CT chest>   03/16/13 bronchiectasis with nodules - 10/22/2014  extensive coaching HFA effectiveness =    90%    Flare x 6 weeks ? Etiology. DDX of  difficult airways management all start with A and  include Adherence, Ace Inhibitors, Acid Reflux, Active Sinus Disease, Alpha 1 Antitripsin deficiency, Anxiety masquerading as Airways dz,  ABPA,  allergy(esp in young), Aspiration (esp in elderly), Adverse effects of meds,  Active smokers, A bunch of PE's (a small clot burden can't cause this syndrome unless there is already severe underlying pulm or vascular dz with poor reserve) plus two Bs  = Bronchiectasis and Beta blocker use..and one C= CHF     Adherence is always the initial "prime suspect" and is a multilayered concern that requires a "trust but verify" approach in every patient - starting with knowing how to use medications, especially inhalers, correctly, keeping up with refills and understanding the fundamental difference between maintenance and prns vs those medications only taken for a very short course and then stopped and not refilled.  -- The proper method of use, as well as anticipated side effects, of a metered-dose inhaler are discussed and demonstrated to the patient. Improved effectiveness after extensive coaching during this visit to a level of approximately  90% but doesn't understand prns which were written individually by the symptoms, not the name of med  ? Allergy/asthma component > Prednisone 10 mg take  4 each am x 2 days,   2 each am x 2 days,  1 each am x 2 days and stop   ? Acid (or non-acid) GERD > always difficult to exclude as up to 75% of pts in some series report no  assoc GI/ Heartburn symptoms and she is worse off gerd rx but not convinced this is cause and effect so leave off for now but re-address next ov   ? Bronchiectasis flare > doubt as no purulent sputum > cipro 750 bid prn    I had an extended discussion with the patient reviewing all relevant studies completed to date and  lasting 15 to 20 minutes of a 25 minute visit    Each maintenance medication was reviewed in detail including most importantly the difference between maintenance and prns and under what circumstances the prns are to be triggered using an action plan format that is not reflected in the computer generated alphabetically organized AVS.    Please see instructions for details which were reviewed in writing and the patient given a copy highlighting the part that I personally wrote and discussed at today's ov.

## 2014-10-23 ENCOUNTER — Telehealth: Payer: Self-pay | Admitting: Internal Medicine

## 2014-10-23 NOTE — Telephone Encounter (Signed)
Result Note     Call pt: Reviewed cxr and no acute change so no change in recommendations made at ov  --  I spoke with patient about results and she verbalized understanding and had no questions 

## 2014-10-26 NOTE — Assessment & Plan Note (Signed)
03/28/13  Patient Saturations on Room Air at Rest = 93% Patient Saturations on [employer] while Ambulating = 88% Patient Saturations on 2 Liters of oxygen while Ambulating = 97% - 10/22/2014  Walked RA x 2 laps @ 185 ft each stopped due sob at slow pace but sats still 90%    I doubt she is really stopping due to oxygen deficiency based on her saturations today and would recommend she simply use the oxygen during the day when necessary rather than feeling she has to use it every time she walks

## 2014-11-18 ENCOUNTER — Telehealth: Payer: Self-pay | Admitting: Internal Medicine

## 2014-11-18 NOTE — Telephone Encounter (Signed)
Spoke with pt, wants to make us aware that she has been hospitalized since 10/30/14.  Pt had a perforated colon and had to have emergency surgery on this.  I asked about the refill mentioned below, states that [facility] and does not need a refill at this time.  Nothing further needed.

## 2014-11-19 ENCOUNTER — Other Ambulatory Visit: Payer: Self-pay | Admitting: *Deleted

## 2014-11-19 MED ORDER — ARFORMOTEROL TARTRATE 15 MCG/2ML IN NEBU: 15.0000 ug | INHALATION_SOLUTION | Freq: Two times a day (BID) | RESPIRATORY_TRACT | Status: DC

## 2014-12-03 ENCOUNTER — Ambulatory Visit: Payer: Medicare Other | Admitting: Internal Medicine

## 2014-12-09 ENCOUNTER — Telehealth: Payer: Self-pay | Admitting: Internal Medicine

## 2014-12-09 NOTE — Telephone Encounter (Signed)
lmtcb x1 for pt's daughter. 

## 2014-12-09 NOTE — Telephone Encounter (Signed)
Patient's daughter notified of Dr. Thurston HoleWert's recommendations. Nothing further needed. Closing encounter

## 2014-12-09 NOTE — Telephone Encounter (Signed)
Patient has a "schrinoma - nerve mass".  Patient fell and broke her Femur.  Had surgery on her Femur and back in rehab.  She is having panic attacks.  They are giving her Oxycodone and Sonata. Daughter says that she is waking up feeling like she cannot breath.  She says this is making the panic attacks worse because she cannot breath.  Daughter wants to know if the SOB is caused by these medications.  Patient is now requesting medication for anxiety.  But Daughter wants to know if there is another medication that she can take to help her sleep that will not affect the breathing?  Daughter would like Dr. Thurston HoleWert's opinion as to what patient can take to help with the pain and sleep without affecting the COPD.  Dr. Sherene SiresWert, please advise.  Allergies  Allergen Reactions  . Amoxicillin     REACTION: hives  . Avelox [Moxifloxacin Hcl In Nacl] Itching  . Iron     Per pt can only tolerate iron injection  . Levaquin [Levofloxacin In D5w] Nausea Only  . Statins Other (See Comments)    Joint pain  . Symbicort [Budesonide-Formoterol Fumarate]     Pt reports she cannot take this d/t having glaucoma.  Has tried this and it affected eyes (eye pain).

## 2014-12-09 NOTE — Telephone Encounter (Signed)
She should be able to tolerate mild/moderate sedatives ok even with her lungs as they are and I would not want to try tell her treating doctors which particular meds to use as every one of them would need to be adjusted "real time" to get just the right dose to use and best done by the actively treating doctor

## 2015-02-06 ENCOUNTER — Telehealth: Payer: Self-pay | Admitting: Internal Medicine

## 2015-02-06 NOTE — Telephone Encounter (Signed)
Called spoke with pt daughter. appt scheduled to see MW 1/16 at 10:15. Nothing further needed

## 2015-02-10 ENCOUNTER — Ambulatory Visit (INDEPENDENT_AMBULATORY_CARE_PROVIDER_SITE_OTHER): Payer: Medicare Other | Admitting: Internal Medicine

## 2015-02-10 ENCOUNTER — Encounter: Payer: Self-pay | Admitting: Internal Medicine

## 2015-02-10 VITALS — BP 132/82 | HR 104 | Ht 63.0 in | Wt 112.8 lb

## 2015-02-10 DIAGNOSIS — J471 Bronchiectasis with (acute) exacerbation: Secondary | ICD-10-CM | POA: Diagnosis not present

## 2015-02-10 DIAGNOSIS — J479 Bronchiectasis, uncomplicated: Secondary | ICD-10-CM

## 2015-02-10 NOTE — Progress Notes (Signed)
Subjective:    Patient ID: Lacey Stanton, female    DOB: 03/23/1926   MRN: [id number]   Brief patient profile:  [id number]  yowf never smoker with obstructive  bronchiectasis with symptoms dating back to  the 80's.   History of Present Illness  06/29/2012 f/u ov/Lacey Stanton re bronchiectasis on 02 just at hs and bud/brovana bid maint rx Chief Complaint  Patient presents with  . Follow-up    Pt c/o increased SOB for the past month. She feels that nebs are not lasting long enough any longer.   at baseline maintaining brovana budesonide and rarely needing saba hfa Then one month pta fever no cough but sob and increased proaire and used 02  > ov with Dr Martha Clanwhyte rx Avelox and fever resolved but not breathing to her satisfaction and using proaire 2 x daily but no neb saba. Doe x > slow adls rec increase zantac (ranitidine) to 150 mg after bfast and after supper  GERD  diet Continue to use the nebulizer brovana and budesonide and then 12 hours later. Prednisone 10 mg take  4 each am x 2 days,   2 each am x 2 days,  1 each am x2days and stop  Work on inhaler technique    Only use your albuterol (proaire) as rescue  02/06/2013 f/u ov/Lacey Stanton re: bronchiectasis on maint brovana/ bud plus prn saba hfa Chief Complaint  Patient presents with  . Follow-up    Pt states doing well and denies any new co's today.   typically more congested after lunch and feels her self wheezing so uses albuterol but not really helping much and  not using mucinex much  At all nor the flutter. Not limited by breathing though from desired activities rec Pantoprazole (protonix) 40 mg   Take 30-60 min before first meal of the day and Zantac 150  mg one @ bedtime until return to office -  GERD diet For cough > flutter valve and mucinex or mucinex dm up 1200 mg every 12hours  Relax and do purse lip breath breathing as much as possible.      10/22/2014  f/u ov/[name] re: obstructive bronchiectasis on perf/bud bid and 02 hs and with exertion /  off all gerd rx due to hearing about ppi  Chief Complaint  Patient presents with  . Follow-up    Pt c/o increased SOB for the past 6 wks. She states that she is not coughing more than usual, but seems harder to produce sputum.    only change in rx was stopping gerd rx, still compliant with nebs / rarely using saba rec Prednisone 10 mg take  4 each am x 2 days,   2 each am x 2 days,  1 each am x 2 days and stop  For breathing: Proair 2 puffs up to every 4 h as needed  For coughing: mucinex 600 mg up to 2 every 12 hours as needed and use flutter valve as much as possible  For nasty mucus: cipro 750 twice daily x 7days Please schedule a follow up office visit in 6 weeks, call sooner if needed with pfts  > canceled due to perf colon 10/2014 ? Diverticulitis   Then Fx L femur post d/c from rehab > [facility]    02/10/2015 consultaton/Lacey Stanton re: needs ileostomy revision clearance  Chief Complaint  Patient presents with  . Follow-up    Pt states that her breathing is "okay". She is needing pulmonary cleareance for  ostomy reversal.   on bud/formoterol bid with rescue saba biw at most 02 at hs x 2lpm otherwise not needed  Not limited by breathing from desired activities  But by leg weakness    No obvious day to day or daytime variability or assoc excess or purulent sputum  or cp or chest tightness, subjective wheeze or overt sinus or hb symptoms. No unusual exp hx or h/o childhood pna/ asthma or knowledge of premature birth.  Sleeping ok without nocturnal  or early am exacerbation  of respiratory  c/o's or need for noct saba. Also denies any obvious fluctuation of symptoms with weather or environmental changes or other aggravating or alleviating factors except as outlined above   Current Medications, Allergies, Complete Past Medical History, Past Surgical History, Family History, and Social History were reviewed in [facility] electronic [facility] record.  ROS  The following  are not active complaints unless bolded sore throat, dysphagia, dental problems, itching, sneezing,  nasal congestion or excess/ purulent secretions, ear ache,   fever, chills, sweats, unintended wt loss, classically pleuritic or exertional cp, hemoptysis,  orthopnea pnd or leg swelling, presyncope, palpitations, abdominal pain, anorexia, nausea, vomiting, diarrhea  or change in bowel or bladder habits, change in stools or urine, dysuria,hematuria,  rash, arthralgias, visual complaints, headache, numbness, weakness or ataxia or problems with walking or coordination,  change in mood/affect or memory.            Past Medical History:  Bronchiectasis  - CT 11/07/08: Bronchiectasis in the lingular > RML plus small HH  - Sinus CT neg 04/17/09  - HFA 25 > 75% effective April 15, 2009  -PFT's 09/16/2010   FEV1  .89( 55%)  Ratio 49  No better with B2,  DLCO 62 > improved to 107 Pneumonia > cleared radiographically 07/01/2010     - Pneumovax March 2011  Allergic Rhinitis  Pulmonary hemorrhage 1979   Osteoarthritis      - s/p  L TKR 02/2000 >>  Difficult recovery Hypothyroidism  GERD                Objective:   Physical Exam  Edentulous pleasant amb wf nad  / vital signs reviewed   Wt 125 08/03/2010  > 122 09/16/2010  > 128 12/09/2010 > 03/15/2011  133> 6/52014  129> 11/08/2012 131 >  10/22/2014   129 > 02/10/2015  113   GEN: A/Ox3; pleasant , NAD, elderly .  HEENT:  [place]/AT,  EACs-clear, TMs-wnl, NOSE-clear, THROAT-clear, no lesions, no postnasal drip or exudate noted.   NECK:  Supple w/ fair ROM; no JVD; normal carotid impulses w/o bruits; no thyromegaly or nodules palpated; no lymphadenopathy.  RESP  no accessory muscle use, no dullness to percussion.   minimal insp and exp rhonchi bilaterally   CARD:  RRR, no m/r/g  , no peripheral edema, pulses intact, no cyanosis or clubbing.  GI:   Soft & nt; nml bowel sounds; no organomegaly or masses detected.  Musc:  Warm bil, no deformities or  joint swelling noted.      I personally reviewed images and agree with radiology impression as follows:  CXR:  10/22/2014    The lungs are hyperinflated likely secondary to COPD. There is no focal parenchymal opacity. There is no pleural effusion or pneumothorax. The heart and mediastinal contours are unremarkable  Assessment & Plan:

## 2015-02-10 NOTE — Patient Instructions (Addendum)
No contraindication to abdominal surgery unless flare of cough in meantime   Change appt for pfts to 3 months

## 2015-02-21 ENCOUNTER — Ambulatory Visit: Payer: Medicare Other | Admitting: Internal Medicine

## 2015-03-03 ENCOUNTER — Ambulatory Visit: Payer: Medicare Other | Admitting: Internal Medicine

## 2015-05-12 ENCOUNTER — Ambulatory Visit: Payer: Medicare Other | Admitting: Internal Medicine

## 2015-05-27 DIAGNOSIS — M810 Age-related osteoporosis without current pathological fracture: Secondary | ICD-10-CM | POA: Insufficient documentation

## 2015-05-27 DIAGNOSIS — R269 Unspecified abnormalities of gait and mobility: Secondary | ICD-10-CM | POA: Insufficient documentation

## 2015-05-27 DIAGNOSIS — F419 Anxiety disorder, unspecified: Secondary | ICD-10-CM | POA: Insufficient documentation

## 2015-05-27 DIAGNOSIS — G608 Other hereditary and idiopathic neuropathies: Secondary | ICD-10-CM | POA: Insufficient documentation

## 2015-05-27 DIAGNOSIS — E785 Hyperlipidemia, unspecified: Secondary | ICD-10-CM | POA: Insufficient documentation

## 2015-05-27 DIAGNOSIS — Z79899 Other long term (current) drug therapy: Secondary | ICD-10-CM | POA: Insufficient documentation

## 2015-05-27 DIAGNOSIS — R32 Unspecified urinary incontinence: Secondary | ICD-10-CM | POA: Insufficient documentation

## 2015-07-01 DIAGNOSIS — R399 Unspecified symptoms and signs involving the genitourinary system: Secondary | ICD-10-CM | POA: Insufficient documentation

## 2015-07-25 ENCOUNTER — Other Ambulatory Visit: Payer: Self-pay | Admitting: Internal Medicine

## 2015-07-25 DIAGNOSIS — R0600 Dyspnea, unspecified: Secondary | ICD-10-CM

## 2015-07-28 ENCOUNTER — Encounter: Payer: Self-pay | Admitting: Internal Medicine

## 2015-07-28 ENCOUNTER — Ambulatory Visit (INDEPENDENT_AMBULATORY_CARE_PROVIDER_SITE_OTHER): Payer: Medicare Other | Admitting: Internal Medicine

## 2015-07-28 VITALS — BP 104/64 | HR 75 | Ht 61.5 in | Wt 107.0 lb

## 2015-07-28 DIAGNOSIS — R0600 Dyspnea, unspecified: Secondary | ICD-10-CM

## 2015-07-28 DIAGNOSIS — J471 Bronchiectasis with (acute) exacerbation: Secondary | ICD-10-CM | POA: Diagnosis not present

## 2015-07-28 DIAGNOSIS — J9611 Chronic respiratory failure with hypoxia: Secondary | ICD-10-CM

## 2015-07-28 DIAGNOSIS — J479 Bronchiectasis, uncomplicated: Secondary | ICD-10-CM

## 2015-07-28 LAB — PULMONARY FUNCTION TEST
DL/VA % PRED: 83 %
DL/VA: 3.73 ml/min/mmHg/L
DLCO UNC: 10.07 ml/min/mmHg
DLCO cor % pred: 53 %
DLCO cor: 11.17 ml/min/mmHg
DLCO unc % pred: 48 %
FEF 25-75 PRE: 0.37 L/s
FEF 25-75 Post: 0.4 L/sec
FEF2575-%CHANGE-POST: 9 %
FEF2575-%Pred-Post: 45 %
FEF2575-%Pred-Pre: 41 %
FEV1-%Change-Post: 3 %
FEV1-%PRED-PRE: 54 %
FEV1-%Pred-Post: 56 %
FEV1-PRE: 0.79 L
FEV1-Post: 0.82 L
FEV1FVC-%Change-Post: -1 %
FEV1FVC-%Pred-Pre: 80 %
FEV6-%CHANGE-POST: 4 %
FEV6-%PRED-POST: 77 %
FEV6-%PRED-PRE: 74 %
FEV6-POST: 1.41 L
FEV6-PRE: 1.36 L
FEV6FVC-%CHANGE-POST: 0 %
FEV6FVC-%PRED-PRE: 106 %
FEV6FVC-%Pred-Post: 106 %
FVC-%CHANGE-POST: 4 %
FVC-%Pred-Post: 72 %
FVC-%Pred-Pre: 69 %
FVC-Post: 1.43 L
FVC-Pre: 1.36 L
POST FEV6/FVC RATIO: 99 %
PRE FEV6/FVC RATIO: 99 %
Post FEV1/FVC ratio: 57 %
Pre FEV1/FVC ratio: 58 %
RV % pred: 146 %
RV: 3.54 L
TLC % pred: 103 %
TLC: 4.85 L

## 2015-07-28 NOTE — Patient Instructions (Signed)
No need  For daytime 02   Please schedule a follow up visit in 6  months but call sooner if needed

## 2015-07-28 NOTE — Progress Notes (Signed)
PFT done today. 

## 2015-07-28 NOTE — Progress Notes (Signed)
Subjective:    Patient ID: Lacey Stanton, female    DOB: 03/23/1926   MRN: [id number]   Brief patient profile:  [id number]  yowf never smoker with obstructive  bronchiectasis with symptoms dating back to  the 80's.   History of Present Illness  06/29/2012 f/u ov/Lacey Stanton re bronchiectasis on 02 just at hs and bud/brovana bid maint rx Chief Complaint  Patient presents with  . Follow-up    Pt c/o increased SOB for the past month. She feels that nebs are not lasting long enough any longer.   at baseline maintaining brovana budesonide and rarely needing saba hfa Then one month pta fever no cough but sob and increased proaire and used 02  > ov with Dr Martha Clanwhyte rx Avelox and fever resolved but not breathing to her satisfaction and using proaire 2 x daily but no neb saba. Doe x > slow adls rec increase zantac (ranitidine) to 150 mg after bfast and after supper  GERD  diet Continue to use the nebulizer brovana and budesonide and then 12 hours later. Prednisone 10 mg take  4 each am x 2 days,   2 each am x 2 days,  1 each am x2days and stop  Work on inhaler technique    Only use your albuterol (proaire) as rescue  02/06/2013 f/u ov/Lacey Stanton re: bronchiectasis on maint brovana/ bud plus prn saba hfa Chief Complaint  Patient presents with  . Follow-up    Pt states doing well and denies any new co's today.   typically more congested after lunch and feels her self wheezing so uses albuterol but not really helping much and  not using mucinex much  At all nor the flutter. Not limited by breathing though from desired activities rec Pantoprazole (protonix) 40 mg   Take 30-60 min before first meal of the day and Zantac 150  mg one @ bedtime until return to office -  GERD diet For cough > flutter valve and mucinex or mucinex dm up 1200 mg every 12hours  Relax and do purse lip breath breathing as much as possible.      10/22/2014  f/u ov/Lacey Stanton re: obstructive bronchiectasis on perf/bud bid and 02 hs and with exertion /  off all gerd rx due to hearing about ppi  Chief Complaint  Patient presents with  . Follow-up    Pt c/o increased SOB for the past 6 wks. She states that she is not coughing more than usual, but seems harder to produce sputum.    only change in rx was stopping gerd rx, still compliant with nebs / rarely using saba rec Prednisone 10 mg take  4 each am x 2 days,   2 each am x 2 days,  1 each am x 2 days and stop  For breathing: Proair 2 puffs up to every 4 h as needed  For coughing: mucinex 600 mg up to 2 every 12 hours as needed and use flutter valve as much as possible  For nasty mucus: cipro 750 twice daily x 7days Please schedule a follow up office visit in 6 weeks, call sooner if needed with pfts  > canceled due to perf colon 10/2014 ? Diverticulitis   Then Fx L femur post d/c from rehab > [facility]    02/10/2015 consultaton/Lacey Stanton re: needs ileostomy revision clearance  Chief Complaint  Patient presents with  . Follow-up    Pt states that her breathing is "okay". She is needing pulmonary cleareance for  ostomy reversal.   on bud/formoterol bid with rescue saba biw at most 02 at hs x 2lpm otherwise not needed  Not limited by breathing from desired activities  But by leg weakness  rec No contraindication to abdominal surgery unless flare of cough in meantime  > reversal of ileostomy> tol well    07/28/2015  f/u ov/Lacey Stanton re: obst bronchiectasis/ noct 02 2lpm / perf/bud bid Chief Complaint  Patient presents with  . Follow-up    PFT's done today. Breathing is doing well. She is using albuterol inhaler 1 to 2 times per wk on average.   really  Not limited by breathing from desired activities     No obvious day to day or daytime variability or assoc excess or purulent sputum  or cp or chest tightness, subjective wheeze or overt sinus or hb symptoms. No unusual exp hx or h/o childhood pna/ asthma or knowledge of premature birth.  Sleeping ok without nocturnal  or early  am exacerbation  of respiratory  c/o's or need for noct saba. Also denies any obvious fluctuation of symptoms with weather or environmental changes or other aggravating or alleviating factors except as outlined above   Current Medications, Allergies, Complete Past Medical History, Past Surgical History, Family History, and Social History were reviewed in [employer] record.  ROS  The following are not active complaints unless bolded sore throat, dysphagia, dental problems, itching, sneezing,  nasal congestion or excess/ purulent secretions, ear ache,   fever, chills, sweats, unintended wt loss, classically pleuritic or exertional cp, hemoptysis,  orthopnea pnd or leg swelling, presyncope, palpitations, abdominal pain, anorexia, nausea, vomiting, diarrhea  or change in bowel or bladder habits, change in stools or urine, dysuria,hematuria,  rash, arthralgias, visual complaints, headache, numbness, weakness or ataxia or problems with walking or coordination,  change in mood/affect or memory.            Past Medical History:  Bronchiectasis  - CT 11/07/08: Bronchiectasis in the lingular > RML plus small HH  - Sinus CT neg 04/17/09  - HFA 25 > 75% effective April 15, 2009  -PFT's 09/16/2010   FEV1  .89( 55%)  Ratio 49  No better with B2,  DLCO 62 > improved to 107 Pneumonia > cleared radiographically 07/01/2010     - Pneumovax March 2011  Allergic Rhinitis  Pulmonary hemorrhage 1979   Osteoarthritis      - s/p  L TKR 02/2000 >>  Difficult recovery Hypothyroidism  GERD                Objective:   Physical Exam  Edentulous pleasant amb wf nad  / vital signs reviewed   Wt 125 08/03/2010  > 122 09/16/2010  > 128 12/09/2010 > 03/15/2011  133> 6/52014  129> 11/08/2012 131 >  10/22/2014   129 > 02/10/2015  113 > 07/28/2015 107   GEN: A/Ox3; pleasant , NAD, elderly .  HEENT:  [place]/AT,  EACs-clear, TMs-wnl, NOSE-clear, THROAT-clear, no lesions, no postnasal drip or exudate  noted.   NECK:  Supple w/ fair ROM; no JVD; normal carotid impulses w/o bruits; no thyromegaly or nodules palpated; no lymphadenopathy.  RESP  no accessory muscle use, no dullness to percussion.   Very minimal insp and exp rhonchi bilaterally   CARD:  RRR, no m/r/g  , no peripheral edema, pulses intact, no cyanosis or clubbing.  GI:   Soft & nt; nml bowel sounds; no organomegaly or masses detected.  Musc:  Warm  bil, no deformities or joint swelling noted.        Assessment & Plan:

## 2015-07-29 NOTE — Assessment & Plan Note (Deleted)
03/28/13  Patient Saturations on Room Air at Rest = 93% Patient Saturations on [employer] while Ambulating = 88% Patient Saturations on 2 Liters of oxygen while Ambulating = 97% - 10/22/2014  Walked RA x 2 laps @ 185 ft each stopped due sob at slow pace but sats still 90%   - 07/28/2015  Walked RA x 3 laps @ 185 ft each stopped due to  End of study, nl pace, no sob or desat     D/c 02

## 2015-07-29 NOTE — Assessment & Plan Note (Signed)
Followed in Pulmonary clinic/ [facility]/ Kristle Wesch  - CT 11/07/08: Bronchiectasis in the lingula  > rml plus small HH  - Sinus CT neg 04/17/09   -PFT's 09/16/2010   FEV1  .89( 55%)  Ratio 49  No better with B2,  DLCO 62 > improved to 107 - PFTs  08/10/2012    FEV1  0.78 (50%) ratio 58,  No better p B2  DLCO 55 improved to 86% - Alpha one AT genotype 07/07/10: MM CT chest>   03/16/13 bronchiectasis with nodules - 10/22/2014  extensive coaching HFA effectiveness =    90%  - Spirometry 02/10/2015  FEV1  0.54 (33%) ratio 53   - PFT's  07/28/2015  FEV1 0.82 (56 % ) ratio 57  p 3 % improvement from saba p laba/ics neb  prior to study with DLCO  48/53 % corrects to 83 % for alv volume   Tolerated mulitple abd surgeries and only slightly Improved on present rx but she is  relatively sedentary/ really not limited from desired activities by breathing and minimal need for saba > no change rx needed   I had an extended discussion with the patient reviewing all relevant studies completed to date and  lasting 15 to 20 minutes of a 25 minute visit    Each maintenance medication was reviewed in detail including most importantly the difference between maintenance and prns and under what circumstances the prns are to be triggered using an action plan format that is not reflected in the computer generated alphabetically organized AVS.    Please see instructions for details which were reviewed in writing and the patient given a copy highlighting the part that I personally wrote and discussed at today's ov.  F/u can now be q 6 m

## 2015-07-29 NOTE — Assessment & Plan Note (Addendum)
03/28/13  Patient Saturations on Room Air at Rest = 93% Patient Saturations on [employer] Air while Ambulating = 88% Patient Saturations on 2 Liters of oxygen while Ambulating = 97% - 10/22/2014  Walked RA x 2 laps @ 185 ft each stopped due sob at slow pace but sats still 90%   - 07/28/2015  Walked RA x 3 laps @ 185 ft each stopped due to  End of study, nl pace, no sob or desat  > d/c amb 02

## 2015-07-29 NOTE — Assessment & Plan Note (Addendum)
Followed in Pulmonary clinic/ [facility]/ Lilburn Straw  - CT 11/07/08: Bronchiectasis in the lingula  > rml plus small HH  - Sinus CT neg 04/17/09   -PFT's 09/16/2010   FEV1  .89( 55%)  Ratio 49  No better with B2,  DLCO 62 > improved to 107 - PFTs  08/10/2012    FEV1  0.78 (50%) ratio 58,  No better p B2  DLCO 55 improved to 86% - Alpha one AT genotype 07/07/10: MM CT chest>   03/16/13 bronchiectasis with nodules - 10/22/2014  extensive coaching HFA effectiveness =    90%  - Spirometry 02/10/2015  FEV1  0.54 (33%) ratio 53        Despite severity of airflow obst she has minimal symptoms though limited capacity to cough effectively should she get in trouble with post op secretion issues.  There is no "line in the sand" prohibiting pts with airflow obst from having elective abd surgery and since this is not GB surgery the risk is moderate, not severe, and she really wants the ileostomy reversed to "get her life back" so I rec no change in rx and proceed with ileostomy takedown unless she has a flare of resp symptom in the meantime  I had an extended discussion with the patient reviewing all relevant studies completed to date and  lasting 35  minutes of a 60 minute consultation visit   Each maintenance medication was reviewed in detail including most importantly the difference between maintenance and prns and under what circumstances the prns are to be triggered using an action plan format that is not reflected in the computer generated alphabetically organized AVS.    Please see instructions for details which were reviewed in writing and the patient given a copy highlighting the part that I personally wrote and discussed at today's ov.

## 2015-09-12 ENCOUNTER — Telehealth: Payer: Self-pay | Admitting: Internal Medicine

## 2015-09-12 NOTE — Telephone Encounter (Signed)
Spoke with patient, she says that she has been coughing a lot and sob and would like to be seen.  She was unable to come in on Monday due to conflicting appointments, so I scheduled patient to see Dr. Marchelle Gearingamaswamy at 215 on Tuesday, 8/22.  Nothing further needed.

## 2015-09-16 ENCOUNTER — Ambulatory Visit: Payer: Medicare Other | Admitting: Internal Medicine

## 2015-10-13 DIAGNOSIS — M25471 Effusion, right ankle: Secondary | ICD-10-CM | POA: Insufficient documentation

## 2015-10-14 DIAGNOSIS — D500 Iron deficiency anemia secondary to blood loss (chronic): Secondary | ICD-10-CM | POA: Insufficient documentation

## 2015-10-16 ENCOUNTER — Ambulatory Visit: Payer: Medicare Other | Admitting: Internal Medicine

## 2016-01-30 ENCOUNTER — Ambulatory Visit: Payer: Medicare Other | Admitting: Internal Medicine

## 2016-02-23 ENCOUNTER — Encounter: Payer: Self-pay | Admitting: Internal Medicine

## 2016-02-23 ENCOUNTER — Ambulatory Visit (INDEPENDENT_AMBULATORY_CARE_PROVIDER_SITE_OTHER)
Admission: RE | Admit: 2016-02-23 | Discharge: 2016-02-23 | Disposition: A | Discharge status: Home / Self Care | Payer: Medicare Other | Source: Ambulatory Visit | Attending: Internal Medicine | Admitting: Internal Medicine

## 2016-02-23 ENCOUNTER — Ambulatory Visit (INDEPENDENT_AMBULATORY_CARE_PROVIDER_SITE_OTHER): Payer: Medicare Other | Admitting: Internal Medicine

## 2016-02-23 VITALS — BP 118/64 | HR 77 | Ht 62.0 in | Wt 113.0 lb

## 2016-02-23 DIAGNOSIS — J471 Bronchiectasis with (acute) exacerbation: Secondary | ICD-10-CM | POA: Diagnosis not present

## 2016-02-23 DIAGNOSIS — J479 Bronchiectasis, uncomplicated: Secondary | ICD-10-CM

## 2016-02-23 DIAGNOSIS — J9611 Chronic respiratory failure with hypoxia: Secondary | ICD-10-CM | POA: Diagnosis not present

## 2016-02-23 NOTE — Patient Instructions (Addendum)
Please remember to go to the  x-ray department downstairs in the basement  for your tests - we will call you with the results when they are available.      Please schedule a follow up visit in  6 months but call sooner if needed. 

## 2016-02-23 NOTE — Progress Notes (Signed)
Subjective:    Patient ID: Lacey Stanton, female    DOB: [birth year]   MRN: [id number]   Brief patient profile:  [age] yowf never smoker with obstructive  bronchiectasis with symptoms dating back to  the 80's.   History of Present Illness  06/29/2012 f/u ov/Lacey Stanton re bronchiectasis on 02 just at hs and bud/brovana bid maint rx Chief Complaint  Patient presents with  . Follow-up    Pt c/o increased SOB for the past month. She feels that nebs are not lasting long enough any longer.   at baseline maintaining brovana budesonide and rarely needing saba hfa Then one month pta fever no cough but sob and increased proaire and used 02  > ov with Dr Lacey Stanton rx Avelox and fever resolved but not breathing to her satisfaction and using proaire 2 x daily but no neb saba. Doe x > slow adls rec increase zantac (ranitidine) to 150 mg after bfast and after supper  GERD  diet Continue to use the nebulizer brovana and budesonide and then 12 hours later. Prednisone 10 mg take  4 each am x 2 days,   2 each am x 2 days,  1 each am x2days and stop  Work on inhaler technique    Only use your albuterol (proaire) as rescue  02/06/2013 f/u ov/Lacey Stanton re: bronchiectasis on maint brovana/ bud plus prn saba hfa Chief Complaint  Patient presents with  . Follow-up    Pt states doing well and denies any new co's today.   typically more congested after lunch and feels her self wheezing so uses albuterol but not really helping much and  not using mucinex much  At all nor the flutter. Not limited by breathing though from desired activities rec Pantoprazole (protonix) 40 mg   Take 30-60 min before first meal of the day and Zantac 150  mg one @ bedtime until return to office -  GERD diet For cough > flutter valve and mucinex or mucinex dm up 1200 mg every 12hours  Relax and do purse lip breath breathing as much as possible.      10/22/2014  f/u ov/Lacey Stanton re: obstructive bronchiectasis on perf/bud bid and 02 hs and with exertion /  off all gerd rx due to hearing about ppi  Chief Complaint  Patient presents with  . Follow-up    Pt c/o increased SOB for the past 6 wks. She states that she is not coughing more than usual, but seems harder to produce sputum.    only change in rx was stopping gerd rx, still compliant with nebs / rarely using saba rec Prednisone 10 mg take  4 each am x 2 days,   2 each am x 2 days,  1 each am x 2 days and stop  For breathing: Proair 2 puffs up to every 4 h as needed  For coughing: mucinex 600 mg up to 2 every 12 hours as needed and use flutter valve as much as possible  For nasty mucus: cipro 750 twice daily x 7days Please schedule a follow up office visit in 6 weeks, call sooner if needed with pfts  > canceled due to perf colon 10/2014 ? Diverticulitis   Then Fx L femur post d/c from rehab > [facility]    02/10/2015 consultaton/Lacey Stanton re: needs ileostomy revision clearance  Chief Complaint  Patient presents with  . Follow-up    Pt states that her breathing is "okay". She is needing pulmonary cleareance for ostomy  reversal.   on bud/formoterol bid with rescue saba biw at most 02 at hs x 2lpm otherwise not needed  Not limited by breathing from desired activities  But by leg weakness  rec No contraindication to abdominal surgery unless flare of cough in meantime  > reversal of ileostomy> tol well    07/28/2015  f/u ov/Lacey Stanton re: obst bronchiectasis/ noct 02 2lpm / perf/bud bid Chief Complaint  Patient presents with  . Follow-up    PFT's done today. Breathing is doing well. She is using albuterol inhaler 1 to 2 times per wk on average.   really  Not limited by breathing from desired activities  rec Continue 02 2lpm at bedtime only no need for daytime   02/23/2016  f/u ov/Lacey Stanton re: bronchiectasis flare  On formoterol and bud neb/ 02 hs 2lpm  Chief Complaint  Patient presents with  . Follow-up    Pt states she seen at [facility] and was dxed with PNA 3-4 wks ago. She was txed  with Doxy.  Today she states she is "fine" and denies any co's.    no prn doxy  Previously but had it but not now and always effective before   No obvious patterns day to day or daytime variability or assoc  Sob or mucus plugs or hemoptysis or cp or chest tightness, subjective wheeze or overt sinus or hb symptoms. No unusual exp hx or h/o childhood pna/ asthma or knowledge of premature birth.  Sleeping ok without nocturnal  or early am exacerbation  of respiratory  c/o's or need for noct saba. Also denies any obvious fluctuation of symptoms with weather or environmental changes or other aggravating or alleviating factors except as outlined above   Current Medications, Allergies, Complete Past Medical History, Past Surgical History, Family History, and Social History were reviewed in [employer] record.  ROS  The following are not active complaints unless bolded sore throat, dysphagia, dental problems, itching, sneezing,  nasal congestion or excess/ purulent secretions, ear ache,   fever, chills, sweats, unintended wt loss, classically pleuritic or exertional cp,  orthopnea pnd or leg swelling, presyncope, palpitations, abdominal pain, anorexia, nausea, vomiting, diarrhea  or change in bowel or bladder habits, change in stools or urine, dysuria,hematuria,  rash, arthralgias, visual complaints, headache, numbness, weakness or ataxia or problems with walking or coordination,  change in mood/affect or memory.         Past Medical History:  Bronchiectasis  - CT 11/07/08: Bronchiectasis in the lingular > RML plus small HH  - Sinus CT neg 04/17/09  - HFA 25 > 75% effective April 15, 2009  -PFT's 09/16/2010   FEV1  .89( 55%)  Ratio 49  No better with B2,  DLCO 62 > improved to 107 Pneumonia > cleared radiographically 07/01/2010     - Pneumovax March 2011  Allergic Rhinitis  Pulmonary hemorrhage 1979   Osteoarthritis      - s/p  L TKR 02/2000 >>  Difficult recovery Hypothyroidism   GERD                Objective:   Physical Exam  Edentulous pleasant amb wf nad  / vital signs reviewed   Wt 125 08/03/2010  > 122 09/16/2010  > 128 12/09/2010 > 03/15/2011  133> 6/52014  129> 11/08/2012 131 >  10/22/2014   129 > 02/10/2015  113 > 07/28/2015 107 > 02/29/2016  113   Vital signs reviewed  - Note on arrival 02 sats  100%  on RA    GEN: A/Ox3; pleasant , NAD,  elderly .  HEENT:  [place]/AT,  EACs-clear, TMs-wnl, NOSE-clear, THROAT-clear, no lesions, no postnasal drip or exudate noted.   NECK:  Supple w/ fair ROM; no JVD; normal carotid impulses w/o bruits; no thyromegaly or nodules palpated; no lymphadenopathy.    RESP  no accessory muscle use, no dullness to percussion.  Minimal insp and exp rhonchi bilaterally       CARD:  RRR, no m/r/g  , no peripheral edema, pulses intact, no cyanosis or clubbing.  GI:   Soft & nt; nml bowel sounds; no organomegaly or masses detected.   Musc:  Warm bil, no deformities or joint swelling noted.      CXR PA and Lateral:   02/23/2016 :    I personally reviewed images and agree with radiology impression as follows:    Emphysematous changes and pulmonary scarring. No acute overlying pulmonary process.  Assessment & Plan:

## 2016-02-24 NOTE — Progress Notes (Signed)
Spoke with pt and notified of results per Dr. Wert. Pt verbalized understanding and denied any questions. 

## 2016-02-29 ENCOUNTER — Encounter: Payer: Self-pay | Admitting: Internal Medicine

## 2016-02-29 NOTE — Assessment & Plan Note (Signed)
-   CT 11/07/08: Bronchiectasis in the lingula  > rml plus small HH  - Sinus CT neg 04/17/09   -PFT's 09/16/2010   FEV1  .89( 55%)  Ratio 49  No better with B2,  DLCO 62 > improved to 107 - PFTs  08/10/2012    FEV1  0.78 (50%) ratio 58,  No better p B2  DLCO 55 improved to 86% - Alpha one AT genotype 07/07/10: MM CT chest>   03/16/13 bronchiectasis with nodules - 10/22/2014  extensive coaching HFA effectiveness =    90%  - Spirometry 02/10/2015  FEV1  0.54 (33%) ratio 53   - PFT's  07/28/2015  FEV1 0.82 (56 % ) ratio 57  p 3 % improvement from saba p laba/ics neb  prior to study with DLCO  48/53 % corrects to 83 % for alv volume  Handled flare fine and back to baseline - ok to continue to use doxy prn exac     Each maintenance medication was reviewed in detail including most importantly the difference between maintenance and as needed and under what circumstances the prns are to be used. This was done in the context of a medication calendar review which provided the patient with a user-friendly unambiguous mechanism for medication administration and reconciliation and provides an action plan for all active problems. It is critical that this be shown to every doctor  for modification during the office visit if necessary so the patient can use it as a working document.      F/u q 6 m sooner prn

## 2016-02-29 NOTE — Assessment & Plan Note (Signed)
03/28/13  Patient Saturations on Room Air at Rest = 93% Patient Saturations on [employer] Air while Ambulating = 88% Patient Saturations on 2 Liters of oxygen while Ambulating = 97% - 10/22/2014  Walked RA x 2 laps @ 185 ft each stopped due sob at slow pace but sats still 90%   - 07/28/2015  Walked RA x 3 laps @ 185 ft each stopped due to  End of study, nl pace, no sob or desat   > d/c amb 02 just using 02 at hs as of 07/2015

## 2016-03-15 ENCOUNTER — Telehealth: Payer: Self-pay | Admitting: Internal Medicine

## 2016-03-15 DIAGNOSIS — J9611 Chronic respiratory failure with hypoxia: Secondary | ICD-10-CM

## 2016-03-15 NOTE — Telephone Encounter (Signed)
Fine with me

## 2016-03-15 NOTE — Telephone Encounter (Signed)
MW  Pt called and stated her nebulizer machine is no longer working. She wanted to know if we could place an order to her DME [facility] for a new one. Please advise if it is ok to place the order.

## 2016-03-15 NOTE — Telephone Encounter (Signed)
Spoke with pt and made her aware MW was fine with the order being placed. She is aware that the order is being placed today. The order was placed. Nothing further is needed at this time.

## 2016-03-16 ENCOUNTER — Other Ambulatory Visit: Payer: Self-pay | Admitting: Internal Medicine

## 2016-03-16 DIAGNOSIS — J479 Bronchiectasis, uncomplicated: Secondary | ICD-10-CM

## 2016-03-18 DIAGNOSIS — Z853 Personal history of malignant neoplasm of breast: Secondary | ICD-10-CM | POA: Insufficient documentation

## 2016-07-02 ENCOUNTER — Ambulatory Visit (INDEPENDENT_AMBULATORY_CARE_PROVIDER_SITE_OTHER): Payer: Medicare Other | Admitting: Internal Medicine

## 2016-07-02 ENCOUNTER — Encounter: Payer: Self-pay | Admitting: Internal Medicine

## 2016-07-02 VITALS — BP 106/60 | HR 66 | Ht 61.0 in | Wt 112.2 lb

## 2016-07-02 DIAGNOSIS — J9611 Chronic respiratory failure with hypoxia: Secondary | ICD-10-CM

## 2016-07-02 DIAGNOSIS — J479 Bronchiectasis, uncomplicated: Secondary | ICD-10-CM | POA: Diagnosis not present

## 2016-07-02 MED ORDER — DOXYCYCLINE HYCLATE 100 MG PO TABS: 100.0000 mg | ORAL_TABLET | Freq: Two times a day (BID) | ORAL | 11 refills | Status: DC

## 2016-07-02 MED ORDER — ALBUTEROL SULFATE HFA 108 (90 BASE) MCG/ACT IN AERS: 2.0000 | INHALATION_SPRAY | RESPIRATORY_TRACT | 1 refills | Status: DC | PRN

## 2016-07-02 NOTE — Progress Notes (Signed)
Subjective:    Patient ID: Lacey Stanton, female    DOB: [birth year]   MRN: [id number]   Brief patient profile:  [age] yowf never smoker with obstructive  bronchiectasis with symptoms dating back to  the 80's.   History of Present Illness  06/29/2012 f/u ov/Lacey Stanton re bronchiectasis on 02 just at hs and bud/brovana bid maint rx Chief Complaint  Patient presents with  . Follow-up    Pt c/o increased SOB for the past month. She feels that nebs are not lasting long enough any longer.   at baseline maintaining brovana budesonide and rarely needing saba hfa Then one month pta fever no cough but sob and increased proaire and used 02  > ov with Dr Lacey Stanton rx Avelox and fever resolved but not breathing to her satisfaction and using proaire 2 x daily but no neb saba. Doe x > slow adls rec increase zantac (ranitidine) to 150 mg after bfast and after supper  GERD  diet Continue to use the nebulizer brovana and budesonide and then 12 hours later. Prednisone 10 mg take  4 each am x 2 days,   2 each am x 2 days,  1 each am x2days and stop  Work on inhaler technique    Only use your albuterol (proaire) as rescue  02/06/2013 f/u ov/Lacey Stanton re: bronchiectasis on maint brovana/ bud plus prn saba hfa Chief Complaint  Patient presents with  . Follow-up    Pt states doing well and denies any new co's today.   typically more congested after lunch and feels her self wheezing so uses albuterol but not really helping much and  not using mucinex much  At all nor the flutter. Not limited by breathing though from desired activities rec Pantoprazole (protonix) 40 mg   Take 30-60 min before first meal of the day and Zantac 150  mg one @ bedtime until return to office -  GERD diet For cough > flutter valve and mucinex or mucinex dm up 1200 mg every 12hours  Relax and do purse lip breath breathing as much as possible.      10/22/2014  f/u ov/Lacey Stanton re: obstructive bronchiectasis on perf/bud bid and 02 hs and with exertion /  off all gerd rx due to hearing about ppi  Chief Complaint  Patient presents with  . Follow-up    Pt c/o increased SOB for the past 6 wks. She states that she is not coughing more than usual, but seems harder to produce sputum.    only change in rx was stopping gerd rx, still compliant with nebs / rarely using saba rec Prednisone 10 mg take  4 each am x 2 days,   2 each am x 2 days,  1 each am x 2 days and stop  For breathing: Proair 2 puffs up to every 4 h as needed  For coughing: mucinex 600 mg up to 2 every 12 hours as needed and use flutter valve as much as possible  For nasty mucus: cipro 750 twice daily x 7days Please schedule a follow up office visit in 6 weeks, call sooner if needed with pfts  > canceled due to perf colon 10/2014 ? Diverticulitis   Then Fx L femur post d/c from rehab > [facility]    02/10/2015 consultaton/Lacey Stanton re: needs ileostomy revision clearance  Chief Complaint  Patient presents with  . Follow-up    Pt states that her breathing is "okay". She is needing pulmonary cleareance for ostomy  reversal.   on bud/formoterol bid with rescue saba biw at most 02 at hs x 2lpm otherwise not needed  Not limited by breathing from desired activities  But by leg weakness  rec No contraindication to abdominal surgery unless flare of cough in meantime  > reversal of ileostomy> tol well    07/28/2015  f/u ov/Lacey Stanton re: obst bronchiectasis/ noct 02 2lpm / perf/bud bid Chief Complaint  Patient presents with  . Follow-up    PFT's done today. Breathing is doing well. She is using albuterol inhaler 1 to 2 times per wk on average.   really  Not limited by breathing from desired activities  rec Continue 02 2lpm at bedtime only no need for daytime   07/02/2016  f/u ov/Lacey Stanton re: obst bronchiectasis / doxy expired / 02 hs/ maint rx brov/bud/ flutter  Chief Complaint  Patient presents with  . Follow-up    Pt states that she went UC 06/27/16 and was dxed with PNA.  She  states that she had been coughing alot and had a fever. She is currently on pred and azithromycin and her cough is much improved.    back to baseline just wondering what do do with next flare and whether really needs to complete a 14 day taper of pred rec for present exac  Not limited by breathing from desired activities  Though not all that active / no need for saba   No obvious day to day or daytime variability or assoc ongoing excess/ purulent sputum or mucus plugs or hemoptysis or cp or chest tightness, subjective wheeze or overt sinus or hb symptoms. No unusual exp hx or h/o childhood pna/ asthma or knowledge of premature birth.  Sleeping ok without nocturnal  or early am exacerbation  of respiratory  c/o's or need for noct saba. Also denies any obvious fluctuation of symptoms with weather or environmental changes or other aggravating or alleviating factors except as outlined above   Current Medications, Allergies, Complete Past Medical History, Past Surgical History, Family History, and Social History were reviewed in [employer] record.  ROS  The following are not active complaints unless bolded sore throat, dysphagia, dental problems, itching, sneezing,  nasal congestion or excess/ purulent secretions, ear ache,   fever, chills, sweats, unintended wt loss, classically pleuritic or exertional cp,  orthopnea pnd or leg swelling, presyncope, palpitations, abdominal pain, anorexia, nausea, vomiting, diarrhea  or change in bowel or bladder habits, change in stools or urine, dysuria,hematuria,  rash, arthralgias, visual complaints, headache, numbness, weakness or ataxia or problems with walking or coordination,  change in mood/affect or memory.              Past Medical History:  Bronchiectasis  - CT 11/07/08: Bronchiectasis in the lingular > RML plus small HH  - Sinus CT neg 04/17/09  - HFA 25 > 75% effective April 15, 2009  -PFT's 09/16/2010   FEV1  .89( 55%)  Ratio 49   No better with B2,  DLCO 62 > improved to 107 Pneumonia > cleared radiographically 07/01/2010     - Pneumovax March 2011  Allergic Rhinitis  Pulmonary hemorrhage 1979   Osteoarthritis      - s/p  L TKR 02/2000 >>  Difficult recovery Hypothyroidism  GERD                Objective:   Physical Exam  Edentulous pleasant amb wf nad    Wt 125 08/03/2010  > 122 09/16/2010  >  128 12/09/2010 > 03/15/2011  133> 8/295626/52014  129> 11/08/2012 131 >  10/22/2014   129 > 02/10/2015  113 > 07/28/2015 107 > 02/29/2016  113 > 07/02/2016 112   Vital signs reviewed  - Note on arrival 02 sats 97% on RA    GEN: A/Ox3; pleasant , NAD,  Elderly wf nad .  HEENT:  [place]/AT,  EACs-clear, TMs-wnl, NOSE-clear, THROAT-clear, no lesions, no postnasal drip or exudate noted.   NECK:  Supple w/ fair ROM; no JVD; normal carotid impulses w/o bruits; no thyromegaly or nodules palpated; no lymphadenopathy.    RESP  no accessory muscle use, no dullness to percussion.  Minimal coarse insp < exp rhonchi bilaterally       CARD:  RRR, no m/r/g  , no peripheral edema, pulses intact, no cyanosis or clubbing.  GI:   Soft & nt; nml bowel sounds; no organomegaly or masses detected.   Musc:  Warm bil, no deformities or joint swelling noted.     I personally reviewed images and agree with radiology impression as follows:  CXR:   06/27/16  mild increase marking/ no acute infiltrates     Assessment & Plan:

## 2016-07-02 NOTE — Patient Instructions (Addendum)
Prednisone 10 mg daily with bfast  x 2 days and stop   For flare of cough / congestion/fever > flutter valve and take doxy x 10 days   Please change  scheduled follow up visit to 6  months but call sooner if needed

## 2016-07-03 NOTE — Assessment & Plan Note (Signed)
-   CT 11/07/08: Bronchiectasis in the lingula  > rml plus small HH  - Sinus CT neg 04/17/09   -PFT's 09/16/2010   FEV1  .89( 55%)  Ratio 49  No better with B2,  DLCO 62 > improved to 107 - PFTs  08/10/2012    FEV1  0.78 (50%) ratio 58,  No better p B2  DLCO 55 improved to 86% - Alpha one AT genotype 07/07/10: MM CT chest>   03/16/13 bronchiectasis with nodules - 10/22/2014  extensive coaching HFA effectiveness =    90%  - Spirometry 02/10/2015  FEV1  0.54 (33%) ratio 53   - PFT's  07/28/2015  FEV1 0.82 (56 % ) ratio 57  p 3 % improvement from saba p laba/ics neb  prior to study with DLCO  48/53 % corrects to 83 % for alv volume    S/p mild flare requiring abx but I doubt she needs much pred as already on mod doses of ICS Neb and doxy would have sufficed had she known how/when to take it   rec doxy x 10 days for flare/ no change maint rx/ continue flutter as much as possible  I had an extended discussion with the patient reviewing all relevant studies completed to date and  lasting 15 to 20 minutes of a 25 minute visit    Each maintenance medication was reviewed in detail including most importantly the difference between maintenance and prns and under what circumstances the prns are to be triggered using an action plan format that is not reflected in the computer generated alphabetically organized AVS.    Please see AVS for specific instructions unique to this visit that I personally wrote and verbalized to the the pt in detail and then reviewed with pt  by my nurse highlighting any  changes in therapy recommended at today's visit to their plan of care.

## 2016-07-03 NOTE — Assessment & Plan Note (Signed)
03/28/13  Patient Saturations on Room Air at Rest = 93% Patient Saturations on [employer] Air while Ambulating = 88% Patient Saturations on 2 Liters of oxygen while Ambulating = 97% - 10/22/2014  Walked RA x 2 laps @ 185 ft each stopped due sob at slow pace but sats still 90%   - 07/28/2015  Walked RA x 3 laps @ 185 ft each stopped due to  End of study, nl pace, no sob or desat   > d/c amb 02 just using 02 at hs as of 07/2015   Adequate control on present rx, reviewed in detail with pt > no change in rx needed  = 2lpm hs only

## 2016-08-26 ENCOUNTER — Ambulatory Visit: Payer: Medicare Other | Admitting: Internal Medicine

## 2016-10-05 ENCOUNTER — Encounter: Payer: Self-pay | Admitting: Internal Medicine

## 2016-10-05 ENCOUNTER — Ambulatory Visit (INDEPENDENT_AMBULATORY_CARE_PROVIDER_SITE_OTHER): Payer: Medicare Other | Admitting: Internal Medicine

## 2016-10-05 VITALS — BP 122/68 | HR 84 | Ht 61.0 in | Wt 107.6 lb

## 2016-10-05 DIAGNOSIS — J9611 Chronic respiratory failure with hypoxia: Secondary | ICD-10-CM

## 2016-10-05 DIAGNOSIS — J479 Bronchiectasis, uncomplicated: Secondary | ICD-10-CM | POA: Diagnosis not present

## 2016-10-05 MED ORDER — DOXYCYCLINE HYCLATE 100 MG PO TABS: 100.0000 mg | ORAL_TABLET | Freq: Two times a day (BID) | ORAL | 5 refills | Status: DC

## 2016-10-05 MED ORDER — METHYLPREDNISOLONE ACETATE 80 MG/ML IJ SUSP: 120.0000 mg | Freq: Once | INTRAMUSCULAR | Status: DC

## 2016-10-05 NOTE — Progress Notes (Signed)
Subjective:    Patient ID: Lacey Stanton, female    DOB: 05-09-1926   MRN: [id number]   Brief patient profile:  64 yowf never smoker with obstructive  bronchiectasis with symptoms dating back to  the 80's.   History of Present Illness  06/29/2012 f/u ov/Wert re bronchiectasis on 02 just at hs and bud/brovana bid maint rx Chief Complaint  Patient presents with  . Follow-up    Pt c/o increased SOB for the past month. She feels that nebs are not lasting long enough any longer.   at baseline maintaining brovana budesonide and rarely needing saba hfa Then one month pta fever no cough but sob and increased proaire and used 02  > ov with Dr Martha Clan rx Avelox and fever resolved but not breathing to her satisfaction and using proaire 2 x daily but no neb saba. Doe x > slow adls rec increase zantac (ranitidine) to 150 mg after bfast and after supper  GERD  diet Continue to use the nebulizer brovana and budesonide and then 12 hours later. Prednisone 10 mg take  4 each am x 2 days,   2 each am x 2 days,  1 each am x2days and stop  Work on inhaler technique    Only use your albuterol (proaire) as rescue  02/06/2013 f/u ov/Wert re: bronchiectasis on maint brovana/ bud plus prn saba hfa Chief Complaint  Patient presents with  . Follow-up    Pt states doing well and denies any new co's today.   typically more congested after lunch and feels her self wheezing so uses albuterol but not really helping much and  not using mucinex much  At all nor the flutter. Not limited by breathing though from desired activities rec Pantoprazole (protonix) 40 mg   Take 30-60 min before first meal of the day and Zantac 150  mg one @ bedtime until return to office GERD diet For cough > flutter valve and mucinex or mucinex dm up 1200 mg every 12hours  Relax and do purse lip breath breathing as much as possible.    07/02/2016  f/u ov/Wert re: obst bronchiectasis / doxy expired / 02 hs/ maint rx brov/bud/ flutter  Chief  Complaint  Patient presents with  . Follow-up    Pt states that she went UC 06/27/16 and was dxed with PNA.  She states that she had been coughing alot and had a fever. She is currently on pred and azithromycin and her cough is much improved.    back to baseline just wondering what do do with next flare and whether really needs to complete a 14 day taper of pred rec for present exac rec Prednisone 10 mg daily with bfast  x 2 days and stop  For flare of cough / congestion/fever > flutter valve and take doxy x 10 days     10/05/2016  f/u ov/Wert re: obst bronchiectasis/ brov/bud and prn saba / ran out of doxy prn  Chief Complaint  Patient presents with  . Acute Visit    Increased cough with sputum production for the past 6 days- sputum dark in the am and lighter throughout the day. She is also having some increased SOB without exertion.   onset was acute with severe persistent day and noct cough  Variably purulent sputum but did not follow action plan as did not know doxy was refillable but has been using the flutter valve Increased need for saba for sob despite maint rx with bud/formoterol Still comfortable  at rest though now noct cough keeping her up and flares in am's also Doe  = MMRC3 = can't walk 100 yards even at a slow pace at a flat grade s stopping due to sob     No obvious day to day or daytime variability or assoc mucus plugs or hemoptysis or cp or chest tightness, subjective wheeze or overt sinus or hb symptoms. No unusual exp hx or h/o childhood pna/ asthma or knowledge of premature birth.   Also denies any obvious fluctuation of symptoms with weather or environmental changes or other aggravating or alleviating factors except as outlined above   Current Allergies, Complete Past Medical History, Past Surgical History, Family History, and Social History were reviewed in [facility] electronic medical record.  ROS  The following are not active complaints unless bolded sore  throat, dysphagia, dental problems, itching, sneezing,  nasal congestion or disharge of excess mucus or purulent secretions, ear ache,   fever, chills, sweats, unintended wt loss or wt gain, classically pleuritic or exertional cp,  orthopnea pnd or leg swelling, presyncope, palpitations, abdominal pain, anorexia, nausea, vomiting, diarrhea  or change in bowel habits or bladder habits, change in stools or change in urine, dysuria, hematuria,  rash, arthralgias, visual complaints, headache, numbness, weakness or ataxia or problems with walking or coordination,  change in mood/affect or memory.        Current Meds  Medication Sig  . albuterol (PROAIR HFA) 108 (90 Base) MCG/ACT inhaler Inhale 2 puffs into the lungs every 4 (four) hours as needed for wheezing.  Marland Kitchen. arformoterol (BROVANA) 15 MCG/2ML NEBU Take 2 mLs (15 mcg total) by nebulization 2 (two) times daily.  Marland Kitchen. BIOTIN PO Take 1,000 mg by mouth daily.  . budesonide (PULMICORT) 0.5 MG/2ML nebulizer solution Take 2 mLs (0.5 mg total) by nebulization 2 (two) times daily.  . Cholecalciferol (VITAMIN D-3) 5000 units TABS Take 1 tablet by mouth daily.  Marland Kitchen. co-enzyme Q-10 30 MG capsule Take by mouth daily.    Marland Kitchen. levothyroxine (SYNTHROID, LEVOTHROID) 50 MCG tablet Take by mouth daily.    . Melatonin 10 MG TABS Take 1 tablet by mouth at bedtime.  . Multiple Vitamins-Minerals (ICAPS) TABS Take 1 tablet by mouth daily.  . OXYGEN 2 lpm with sleep and as needed during the day  . polyethylene glycol (MIRALAX / GLYCOLAX) packet Take 17 g by mouth daily.  . Prenatal Vit-Fe Sulfate-FA (PRENATAL MULTIVIT-IRON PO) Take 1 tablet by mouth daily.  . Probiotic CAPS Take 1 capsule by mouth daily.  Marland Kitchen. Respiratory Therapy Supplies (FLUTTER) DEVI Use as directed  . traMADol-acetaminophen (ULTRACET) 37.5-325 MG per tablet Take 1-2 tablets by mouth every 6 (six) hours as needed.                   Past Medical History:  Bronchiectasis  - CT 11/07/08: Bronchiectasis in  the lingular > RML plus small HH  - Sinus CT neg 04/17/09  - HFA 25 > 75% effective April 15, 2009  -PFT's 09/16/2010   FEV1  .89( 55%)  Ratio 49  No better with B2,  DLCO 62 > improved to 107 Pneumonia > cleared radiographically 07/01/2010     - Pneumovax March 2011  Allergic Rhinitis  Pulmonary hemorrhage 1979   Osteoarthritis      - s/p  L TKR 02/2000 >>  Difficult recovery Hypothyroidism  GERD diet                Objective:  Physical Exam  Edentulous pleasant amb wf nad    Wt 125 08/03/2010  > 122 09/16/2010  > 128 12/09/2010 > 03/15/2011  133> 8/11914  129> 11/08/2012 131 >  10/22/2014   129 > 02/10/2015  113 > 07/28/2015 107 > 02/29/2016  113 > 07/02/2016 112 > 10/05/2016  107   Vital signs reviewed  - Note on arrival 02 sats  94% on RA     GEN: A/Ox3; pleasant , NAD,  Elderly wf nad .  HEENT:  /AT,  EACs-clear, TMs-wnl, NOSE-clear, THROAT-clear, no lesions, no postnasal drip or exudate noted.   NECK:  Supple w/ fair ROM; no JVD; normal carotid impulses w/o bruits; no thyromegaly or nodules palpated; no lymphadenopathy.    RESP  no accessory muscle use, no dullness to percussion.  Moderate coarse  insp + exp rhonchi bilaterally       CARD:  RRR, no m/r/g  , no peripheral edema, pulses intact, no cyanosis or clubbing.  GI:   Soft & nt; nml bowel sounds; no organomegaly or masses detected.   Musc:  Warm bil, no deformities or joint swelling noted.     I personally reviewed images and agree with radiology impression as follows:  CXR:   06/27/16  mild increase markings/ no acute infiltrates     Assessment & Plan:

## 2016-10-05 NOTE — Patient Instructions (Addendum)
For nasty mucus > flutter and use the doxy x 10 days as needed (call if running out of refills but they should last a year)    Prednisone 10 mg take  4 each am x 2 days,   2 each am x 2 days,  1 each am x 2 days and stop    Please schedule a follow up visit in 6  months but call sooner if needed

## 2016-10-06 NOTE — Assessment & Plan Note (Signed)
-   CT 11/07/08: Bronchiectasis in the lingula  > rml plus small HH  - Sinus CT neg 04/17/09   -PFT's 09/16/2010   FEV1  .89( 55%)  Ratio 49  No better with B2,  DLCO 62 > improved to 107 - PFTs  08/10/2012    FEV1  0.78 (50%) ratio 58,  No better p B2  DLCO 55 improved to 86% - Alpha one AT genotype 07/07/10: MM CT chest>   03/16/13 bronchiectasis with nodules - Spirometry 02/10/2015  FEV1  0.54 (33%) ratio 53   - PFT's  07/28/2015  FEV1 0.82 (56 % ) ratio 57  p 3 % improvement from saba p laba/ics neb  prior to study with DLCO  48/53 % corrects to 83 % for alv volume    - 07/02/16 rec doxy prn flares  - 10/05/2016  After extensive coaching HFA effectiveness =    90% from baseline 75%   Not really following written action plan with moderately severe flare > rec doxy x 10 days /mucinex/pred x 6 days /  flutter and f/u with cxr if not improved back to baseline  I had an extended discussion with the patient reviewing all relevant studies completed to date and  lasting 15 to 20 minutes of a 25 minute acute office visit    Each maintenance medication was reviewed in detail including most importantly the difference between maintenance and prns and under what circumstances the prns are to be triggered using an action plan format that is not reflected in the computer generated alphabetically organized AVS.    Please see AVS for specific instructions unique to this visit that I personally wrote and verbalized to the the pt in detail and then reviewed with pt  by my nurse highlighting any  changes in therapy recommended at today's visit to their plan of care.

## 2016-10-06 NOTE — Assessment & Plan Note (Signed)
03/28/13  Patient Saturations on Room Air at Rest = 93% Patient Saturations on [employer] Air while Ambulating = 88% Patient Saturations on 2 Liters of oxygen while Ambulating = 97% - 10/22/2014  Walked RA x 2 laps @ 185 ft each stopped due sob at slow pace but sats still 90%   - 07/28/2015  Walked RA x 3 laps @ 185 ft each stopped due to  End of study, nl pace, no sob or desat   > d/c amb 02 just using 02 at hs as of 07/2015    Despite flare of obst bronchiectasis > Adequate control on present rx, reviewed in detail with pt > no change in rx needed  = 02 hs only at 2lpm

## 2017-01-04 ENCOUNTER — Ambulatory Visit: Payer: Medicare Other | Admitting: Internal Medicine

## 2017-01-27 DIAGNOSIS — R011 Cardiac murmur, unspecified: Secondary | ICD-10-CM | POA: Insufficient documentation

## 2017-02-22 DIAGNOSIS — E782 Mixed hyperlipidemia: Secondary | ICD-10-CM | POA: Insufficient documentation

## 2017-03-23 DIAGNOSIS — I350 Nonrheumatic aortic (valve) stenosis: Secondary | ICD-10-CM | POA: Insufficient documentation

## 2017-04-06 ENCOUNTER — Ambulatory Visit (INDEPENDENT_AMBULATORY_CARE_PROVIDER_SITE_OTHER): Payer: Medicare Other | Admitting: Internal Medicine

## 2017-04-06 ENCOUNTER — Encounter: Payer: Self-pay | Admitting: Internal Medicine

## 2017-04-06 VITALS — BP 118/68 | HR 66 | Ht 62.5 in | Wt 118.0 lb

## 2017-04-06 DIAGNOSIS — J479 Bronchiectasis, uncomplicated: Secondary | ICD-10-CM

## 2017-04-06 MED ORDER — PREDNISONE 10 MG PO TABS: ORAL_TABLET | ORAL | 0 refills | Status: DC

## 2017-04-06 NOTE — Progress Notes (Signed)
Subjective:    Patient ID: Lacey Stanton, female    DOB: [birth year]   MRN: [id number]   Brief patient profile:  21 yowf never smoker with obstructive  bronchiectasis with symptoms dating back to  the 80's.   History of Present Illness  06/29/2012 f/u ov/Lacey Stanton re bronchiectasis on 02 just at hs and bud/brovana bid maint rx Chief Complaint  Patient presents with  . Follow-up    Pt c/o increased SOB for the past month. She feels that nebs are not lasting long enough any longer.   at baseline maintaining brovana budesonide and rarely needing saba hfa Then one month pta fever no cough but sob and increased proaire and used 02  > ov with Dr Martha Clan rx Avelox and fever resolved but not breathing to her satisfaction and using proaire 2 x daily but no neb saba. Doe x > slow adls rec increase zantac (ranitidine) to 150 mg after bfast and after supper  GERD  diet Continue to use the nebulizer brovana and budesonide and then 12 hours later. Prednisone 10 mg take  4 each am x 2 days,   2 each am x 2 days,  1 each am x2days and stop  Work on inhaler technique    Only use your albuterol (proaire) as rescue  02/06/2013 f/u ov/Lacey Stanton re: bronchiectasis on maint brovana/ bud plus prn saba hfa Chief Complaint  Patient presents with  . Follow-up    Pt states doing well and denies any new co's today.   typically more congested after lunch and feels her self wheezing so uses albuterol but not really helping much and  not using mucinex much  At all nor the flutter. Not limited by breathing though from desired activities rec Pantoprazole (protonix) 40 mg   Take 30-60 min before first meal of the day and Zantac 150  mg one @ bedtime until return to office GERD diet For cough > flutter valve and mucinex or mucinex dm up 1200 mg every 12hours  Relax and do purse lip breath breathing as much as possible.    07/02/2016  f/u ov/Lacey Stanton re: obst bronchiectasis / doxy expired / 02 hs/ maint rx brov/bud/ flutter  Chief  Complaint  Patient presents with  . Follow-up    Pt states that she went UC 06/27/16 and was dxed with PNA.  She states that she had been coughing alot and had a fever. She is currently on pred and azithromycin and her cough is much improved.    back to baseline just wondering what do do with next flare and whether really needs to complete a 14 day taper of pred rec for present exac rec Prednisone 10 mg daily with bfast  x 2 days and stop  For flare of cough / congestion/fever > flutter valve and take doxy x 10 days     10/05/2016  f/u ov/Lacey Stanton re: obst bronchiectasis/ brov/bud and prn saba / ran out of doxy prn  Chief Complaint  Patient presents with  . Acute Visit    Increased cough with sputum production for the past 6 days- sputum dark in the am and lighter throughout the day. She is also having some increased SOB without exertion.   onset was acute with severe persistent day and noct cough  Variably purulent sputum but did not follow action plan as did not know doxy was refillable but has been using the flutter valve Increased need for saba for sob despite maint rx with bud/formoterol Still comfortable  at rest though now noct cough keeping her up and flares in am's also Doe  = MMRC3 = can't walk 100 yards even at a slow pace at a flat grade s stopping due to sob rec For nasty mucus > flutter and use the doxy x 10 days as needed (call if running out of refills but they should last a year) Prednisone 10 mg take  4 each am x 2 days,   2 each am x 2 days,  1 each am x 2 days and stop     04/06/2017  f/u ov/Lacey Stanton re: obst bronchiectasis/ chronic 02 dep  Chief Complaint  Patient presents with  . Follow-up    Cough is not bothering her at this time, but she has been having increased SOB since Dec 2018.  She states she gets SOB walking a short distance but can not provide an example.    Dyspnea:  [employer] s 02 but freq stops = MMRC3 = can't walk 100 yards even at a slow pace at a flat grade s  stopping due to sob   Cough: no Sleep: ok on 2lpm  SABA use:  Rare/ doesn't help sob  No obvious day to day or daytime variability or assoc excess/ purulent sputum or mucus plugs or hemoptysis or cp or chest tightness, subjective wheeze or overt sinus or hb symptoms. No unusual exposure hx or h/o childhood pna/ asthma or knowledge of premature birth.  Sleeping ok flat without nocturnal  or early am exacerbation  of respiratory  c/o's or need for noct saba. Also denies any obvious fluctuation of symptoms with weather or environmental changes or other aggravating or alleviating factors except as outlined above   Current Allergies, Complete Past Medical History, Past Surgical History, Family History, and Social History were reviewed in [facility] electronic medical record.  ROS  The following are not active complaints unless bolded Hoarseness, sore throat, dysphagia, dental problems, itching, sneezing,  nasal congestion or discharge of excess mucus or purulent secretions, ear ache,   fever, chills, sweats, unintended wt loss or wt gain, classically pleuritic or exertional cp,  orthopnea pnd or leg swelling, presyncope, palpitations, abdominal pain, anorexia, nausea, vomiting, diarrhea  or change in bowel habits or change in bladder habits, change in stools or change in urine, dysuria, hematuria,  rash, arthralgias, visual complaints, headache, numbness, weakness or ataxia or problems with walking or coordination,  change in mood/affect or memory.        Current Meds  Medication Sig  . albuterol (PROAIR HFA) 108 (90 Base) MCG/ACT inhaler Inhale 2 puffs into the lungs every 4 (four) hours as needed for wheezing.  Marland Kitchen. arformoterol (BROVANA) 15 MCG/2ML NEBU Take 2 mLs (15 mcg total) by nebulization 2 (two) times daily.  Marland Kitchen. BIOTIN PO Take 1,000 mg by mouth daily.  . budesonide (PULMICORT) 0.5 MG/2ML nebulizer solution Take 2 mLs (0.5 mg total) by nebulization 2 (two) times daily.  . Cholecalciferol  (VITAMIN D-3) 5000 units TABS Take 1 tablet by mouth daily.  Marland Kitchen. co-enzyme Q-10 30 MG capsule Take by mouth daily.    Marland Kitchen. levothyroxine (SYNTHROID, LEVOTHROID) 50 MCG tablet Take by mouth daily.    . Melatonin 10 MG TABS Take 1 tablet by mouth at bedtime.  . Multiple Vitamins-Minerals (ICAPS) TABS Take 1 tablet by mouth daily.  . OXYGEN 2 lpm with sleep and as needed during the day  . polyethylene glycol (MIRALAX / GLYCOLAX) packet Take 17 g by mouth daily.  .Marland Kitchen  Prenatal Vit-Fe Sulfate-FA (PRENATAL MULTIVIT-IRON PO) Take 1 tablet by mouth daily.  . Probiotic CAPS Take 1 capsule by mouth daily.  Marland Kitchen Respiratory Therapy Supplies (FLUTTER) DEVI Use as directed  . traMADol-acetaminophen (ULTRACET) 37.5-325 MG per tablet Take 1-2 tablets by mouth every 6 (six) hours as needed.         Past Medical History:  Bronchiectasis  - CT 11/07/08: Bronchiectasis in the lingular > RML plus small HH  - Sinus CT neg 04/17/09  - HFA 25 > 75% effective April 15, 2009  -PFT's 09/16/2010   FEV1  .89( 55%)  Ratio 49  No better with B2,  DLCO 62 > improved to 107 Pneumonia > cleared radiographically 07/01/2010     - Pneumovax March 2011  Allergic Rhinitis  Pulmonary hemorrhage 1979   Osteoarthritis      - s/p  L TKR 02/2000 >>  Difficult recovery Hypothyroidism  GERD diet                Objective:   Physical Exam   hoarse wf nad / voice fatiuge   Wt 125 08/03/2010  > 122 09/16/2010  > 128 12/09/2010 > 03/15/2011  133> 6/52014  129> 11/08/2012 131 >  10/22/2014   129 > 02/10/2015  113 > 07/28/2015 107 > 02/29/2016  113 > 07/02/2016 112 > 10/05/2016  107 > 04/06/2017   118     Vital signs reviewed - Note on arrival 02 sats  95% on RA           HEENT: nl dentition, turbinates bilaterally, and oropharynx. Nl external ear canals without cough reflex   NECK :  without JVD/Nodes/TM/ nl carotid upstrokes bilaterally   LUNGS: no acc muscle use,  Mild t kyphosis with insp and exp rhonchi bilaterally    CV:  RRR  no  s3    II/VI sem but no  increase in P2, and no edema   ABD:  soft and nontender with nl inspiratory excursion in the supine position. No bruits or organomegaly appreciated, bowel sounds nl  MS:  Nl gait/ ext warm without deformities, calf tenderness, cyanosis or clubbing No obvious joint restrictions   SKIN: warm and dry without lesions    NEURO:  alert, approp, nl sensorium with  no motor or cerebellar deficits apparent.          Assessment & Plan:

## 2017-04-06 NOTE — Patient Instructions (Addendum)
Drop budesonide to once a day either day or night for a month to see if helps/ hurt voice   Prednisone 10 mg take  4 each am x 2 days,   2 each am x 2 days,  1 each am x 2 days and stop    Please schedule a follow up visit in 3 months but call sooner if needed with pfts

## 2017-04-12 ENCOUNTER — Encounter: Payer: Self-pay | Admitting: Internal Medicine

## 2017-04-12 NOTE — Assessment & Plan Note (Addendum)
-   CT 11/07/08: Bronchiectasis in the lingula  > rml plus small HH  - Sinus CT neg 04/17/09   -PFT's 09/16/2010   FEV1  .89( 55%)  Ratio 49  No better with B2,  DLCO 62 > improved to 107 - PFTs  08/10/2012    FEV1  0.78 (50%) ratio 58,  No better p B2  DLCO 55 improved to 86% - Alpha one AT genotype 07/07/10: MM CT chest>   03/16/13 bronchiectasis with nodules - Spirometry 02/10/2015  FEV1  0.54 (33%) ratio 53   - PFT's  07/28/2015  FEV1 0.82 (56 % ) ratio 57  p 3 % improvement from saba p laba/ics neb  prior to study with DLCO  48/53 % corrects to 83 % for alv volume   - 07/02/16 rec doxy prn flares  - 10/05/2016  After extensive coaching HFA effectiveness =    90% from baseline 75%   Bud may be contributing to cough or this could be component of cough variant asthma - only way to know is try lower dose of bud and to cover ? Asthma component in short run rx with pred x 6 days only then stop and see how much difference is apparent from the the short term increase in systemic steroids (in terms of cough control)   vs the longterm decrease in ICS  (in terms of throat symptoms) and f/u either way with pfts in 3 months    I had an extended discussion with the patient reviewing all relevant studies completed to date and  lasting 15 to 20 minutes of a 25 minute visit    Each maintenance medication was reviewed in detail including most importantly the difference between maintenance and prns and under what circumstances the prns are to be triggered using an action plan format that is not reflected in the computer generated alphabetically organized AVS.    Please see AVS for specific instructions unique to this visit that I personally wrote and verbalized to the the pt in detail and then reviewed with pt  by my nurse highlighting any  changes in therapy recommended at today's visit to their plan of care.

## 2017-04-20 ENCOUNTER — Telehealth: Payer: Self-pay | Admitting: Internal Medicine

## 2017-04-20 DIAGNOSIS — I119 Hypertensive heart disease without heart failure: Secondary | ICD-10-CM | POA: Insufficient documentation

## 2017-04-20 NOTE — Telephone Encounter (Signed)
Called [facility] and spoke with Clydie BraunKaren stating to her I was return Zack's call on pt.  Clydie BraunKaren stated to me the med that was missing on pt had actually just been sent over and nothing else was needed.  Will close encounter.

## 2017-05-20 DIAGNOSIS — I872 Venous insufficiency (chronic) (peripheral): Secondary | ICD-10-CM | POA: Insufficient documentation

## 2017-05-20 DIAGNOSIS — G608 Other hereditary and idiopathic neuropathies: Secondary | ICD-10-CM | POA: Insufficient documentation

## 2017-06-06 ENCOUNTER — Telehealth: Payer: Self-pay | Admitting: Internal Medicine

## 2017-06-06 MED ORDER — BUDESONIDE 0.5 MG/2ML IN SUSP
0.5000 mg | Freq: Two times a day (BID) | RESPIRATORY_TRACT | 5 refills | Status: DC
Start: 2017-06-06 — End: 2017-11-08

## 2017-06-06 NOTE — Telephone Encounter (Signed)
Pt states she needs a refill on pulmicort to [facility].  This has been sent.  Nothing further needed.

## 2017-07-05 ENCOUNTER — Ambulatory Visit: Payer: Medicare Other | Admitting: Internal Medicine

## 2017-07-12 ENCOUNTER — Encounter: Payer: Self-pay | Admitting: Internal Medicine

## 2017-07-12 ENCOUNTER — Other Ambulatory Visit (INDEPENDENT_AMBULATORY_CARE_PROVIDER_SITE_OTHER): Payer: Medicare Other

## 2017-07-12 ENCOUNTER — Ambulatory Visit (INDEPENDENT_AMBULATORY_CARE_PROVIDER_SITE_OTHER): Payer: Medicare Other | Admitting: Internal Medicine

## 2017-07-12 ENCOUNTER — Ambulatory Visit (INDEPENDENT_AMBULATORY_CARE_PROVIDER_SITE_OTHER)
Admission: RE | Admit: 2017-07-12 | Discharge: 2017-07-12 | Disposition: A | Discharge status: Home / Self Care | Payer: Medicare Other | Source: Ambulatory Visit | Attending: Internal Medicine | Admitting: Internal Medicine

## 2017-07-12 VITALS — BP 142/70 | HR 78 | Ht 61.5 in | Wt 117.0 lb

## 2017-07-12 DIAGNOSIS — R0609 Other forms of dyspnea: Secondary | ICD-10-CM

## 2017-07-12 DIAGNOSIS — J479 Bronchiectasis, uncomplicated: Secondary | ICD-10-CM

## 2017-07-12 LAB — PULMONARY FUNCTION TEST
DL/VA % pred: 75 %
DL/VA: 3.35 ml/min/mmHg/L
DLCO UNC: 9.59 ml/min/mmHg
DLCO unc % pred: 45 %
FEF 25-75 Post: 0.31 L/sec
FEF 25-75 Pre: 0.31 L/sec
FEF2575-%Change-Post: 1 %
FEF2575-%Pred-Post: 40 %
FEF2575-%Pred-Pre: 40 %
FEV1-%CHANGE-POST: -1 %
FEV1-%PRED-PRE: 48 %
FEV1-%Pred-Post: 47 %
FEV1-POST: 0.65 L
FEV1-Pre: 0.65 L
FEV1FVC-%Change-Post: -1 %
FEV1FVC-%PRED-PRE: 82 %
FEV6-%Change-Post: 0 %
FEV6-%PRED-POST: 63 %
FEV6-%Pred-Pre: 63 %
FEV6-POST: 1.11 L
FEV6-Pre: 1.1 L
FEV6FVC-%CHANGE-POST: 0 %
FEV6FVC-%PRED-POST: 107 %
FEV6FVC-%PRED-PRE: 107 %
FVC-%Change-Post: 0 %
FVC-%Pred-Post: 58 %
FVC-%Pred-Pre: 58 %
FVC-Post: 1.11 L
FVC-Pre: 1.11 L
PRE FEV6/FVC RATIO: 100 %
Post FEV1/FVC ratio: 58 %
Post FEV6/FVC ratio: 100 %
Pre FEV1/FVC ratio: 59 %
RV % PRED: 155 %
RV: 3.82 L
TLC % PRED: 110 %
TLC: 5.19 L

## 2017-07-12 LAB — BASIC METABOLIC PANEL
BUN: 19 mg/dL (ref 6–23)
CALCIUM: 9.9 mg/dL (ref 8.4–10.5)
CHLORIDE: 104 meq/L (ref 96–112)
CO2: 29 meq/L (ref 19–32)
Creatinine, Ser: 0.79 mg/dL (ref 0.40–1.20)
GFR: 72.76 mL/min (ref >60.00)
GLUCOSE: 96 mg/dL (ref 70–99)
Potassium: 4.5 mEq/L (ref 3.5–5.1)
SODIUM: 140 meq/L (ref 135–145)

## 2017-07-12 LAB — HEPATIC FUNCTION PANEL
ALT: 10 U/L (ref 0–35)
AST: 16 U/L (ref 0–37)
Albumin: 4.1 g/dL (ref 3.5–5.2)
Alkaline Phosphatase: 55 U/L (ref 39–117)
BILIRUBIN TOTAL: 0.4 mg/dL (ref 0.2–1.2)
Bilirubin, Direct: 0 mg/dL (ref 0.0–0.3)
Total Protein: 6.8 g/dL (ref 6.0–8.3)

## 2017-07-12 LAB — CBC WITH DIFFERENTIAL/PLATELET
BASOS PCT: 1 % (ref 0.0–3.0)
Basophils Absolute: 0.1 10*3/uL (ref 0.0–0.1)
EOS ABS: 0.2 10*3/uL (ref 0.0–0.7)
EOS PCT: 3.8 % (ref 0.0–5.0)
HEMATOCRIT: 34.5 % — AB (ref 36.0–46.0)
HEMOGLOBIN: 11.6 g/dL — AB (ref 12.0–15.0)
LYMPHS PCT: 30.6 % (ref 12.0–46.0)
Lymphs Abs: 1.8 10*3/uL (ref 0.7–4.0)
MCHC: 33.6 g/dL (ref 30.0–36.0)
MCV: 92.3 fl (ref 78.0–100.0)
Monocytes Absolute: 0.5 10*3/uL (ref 0.1–1.0)
Monocytes Relative: 7.6 % (ref 3.0–12.0)
Neutro Abs: 3.4 10*3/uL (ref 1.4–7.7)
Neutrophils Relative %: 57 % (ref 43.0–77.0)
Platelets: 289 10*3/uL (ref 150.0–400.0)
RBC: 3.74 Mil/uL — ABNORMAL LOW (ref 3.87–5.11)
RDW: 14.7 % (ref 11.5–15.5)
WBC: 6 10*3/uL (ref 4.0–10.5)

## 2017-07-12 LAB — BRAIN NATRIURETIC PEPTIDE: Pro B Natriuretic peptide (BNP): 52 pg/mL (ref 0.0–100.0)

## 2017-07-12 LAB — TSH: TSH: 1.01 u[IU]/mL (ref 0.35–4.50)

## 2017-07-12 MED ORDER — CIPROFLOXACIN HCL 750 MG PO TABS: 750.0000 mg | ORAL_TABLET | Freq: Two times a day (BID) | ORAL | 11 refills | Status: DC

## 2017-07-12 MED ORDER — FAMOTIDINE 20 MG PO TABS: ORAL_TABLET | ORAL | 11 refills | Status: DC

## 2017-07-12 MED ORDER — PANTOPRAZOLE SODIUM 40 MG PO TBEC: 40.0000 mg | DELAYED_RELEASE_TABLET | Freq: Every day | ORAL | 2 refills | Status: DC

## 2017-07-12 NOTE — Patient Instructions (Addendum)
Pantoprazole (protonix) 40 mg   Take  30-60 min before first meal of the day and Pepcid (famotidine)  20 mg one @  bedtime until return to office - this is the best way to tell whether stomach acid is contributing to your problem.     GERD (REFLUX)  is an extremely common cause of respiratory symptoms just like yours , many times with no obvious heartburn at all.    It can be treated with medication, but also with lifestyle changes including elevation of the head of your bed (ideally with 6 inch  bed blocks),  Smoking cessation, avoidance of late meals, excessive alcohol, and avoid fatty foods, chocolate, peppermint, colas, red wine, and acidic juices such as orange juice.  NO MINT OR MENTHOL PRODUCTS SO NO COUGH DROPS   USE SUGARLESS CANDY INSTEAD (Jolley ranchers or Stover's or Life Savers) or even ice chips will also do - the key is to swallow to prevent all throat clearing. NO OIL BASED VITAMINS - use powdered substitutes.    For nasty mucus >  cipro 750 mg one twice daily x 10 days - try just one half dose with meals to start with to be sure you tolerate it ok    Work on inhaler technique:  relax and gently blow all the way out then take a nice smooth deep breath back in, triggering the inhaler at same time you start breathing in.  Hold for up to 5 seconds if you can. Blow out thru nose. Rinse and gargle with water when done      We will send Dr Webb Silversmithobert  Butler in [place] a copy of your labs   Please remember to go to the lab and x-ray department downstairs in the basement  for your tests - we will call you with the results when they are available.     Please schedule a follow up visit in 3 months but call sooner if needed

## 2017-07-12 NOTE — Progress Notes (Signed)
PFT Completed today. 07/12/17

## 2017-07-12 NOTE — Progress Notes (Signed)
Subjective:    Patient ID: Lacey Stanton, female    DOB: Apr 05, 1926   MRN: [id number]   Brief patient profile:  65 yowf never smoker with obstructive  bronchiectasis with resp symptoms dating back to  the 1980's of cough/sob    History of Present Illness  06/29/2012 f/u ov/Lacey Stanton re bronchiectasis on 02 just at hs and bud/brovana bid maint rx Chief Complaint  Patient presents with  . Follow-up    Pt c/o increased SOB for the past month. She feels that nebs are not lasting long enough any longer.   at baseline maintaining brovana budesonide and rarely needing saba hfa Then one month pta fever no cough but sob and increased proaire and used 02  > ov with Dr Martha Clan rx Avelox and fever resolved but not breathing to her satisfaction and using proaire 2 x daily but no neb saba. Doe x > slow adls rec increase zantac (ranitidine) to 150 mg after bfast and after supper  GERD  diet Continue to use the nebulizer brovana and budesonide and then 12 hours later. Prednisone 10 mg take  4 each am x 2 days,   2 each am x 2 days,  1 each am x2days and stop  Work on inhaler technique    Only use your albuterol (proaire) as rescue  02/06/2013 f/u ov/Lacey Stanton re: bronchiectasis on maint brovana/ bud plus prn saba hfa Chief Complaint  Patient presents with  . Follow-up    Pt states doing well and denies any new co's today.   typically more congested after lunch and feels her self wheezing so uses albuterol but not really helping much and  not using mucinex much  At all nor the flutter. Not limited by breathing though from desired activities rec Pantoprazole (protonix) 40 mg   Take 30-60 min before first meal of the day and Zantac 150  mg one @ bedtime until return to office GERD diet For cough > flutter valve and mucinex or mucinex dm up 1200 mg every 12hours  Relax and do purse lip breath breathing as much as possible.    07/02/2016  f/u ov/Lacey Stanton re: obst bronchiectasis / doxy expired / 02 hs/ maint rx  brov/bud/ flutter  Chief Complaint  Patient presents with  . Follow-up    Pt states that she went UC 06/27/16 and was dxed with PNA.  She states that she had been coughing alot and had a fever. She is currently on pred and azithromycin and her cough is much improved.    back to baseline just wondering what do do with next flare and whether really needs to complete a 14 day taper of pred rec for present exac rec Prednisone 10 mg daily with bfast  x 2 days and stop  For flare of cough / congestion/fever > flutter valve and take doxy x 10 days     10/05/2016  f/u ov/Lacey Stanton re: obst bronchiectasis/ brov/bud and prn saba / ran out of doxy prn  Chief Complaint  Patient presents with  . Acute Visit    Increased cough with sputum production for the past 6 days- sputum dark in the am and lighter throughout the day. She is also having some increased SOB without exertion.   onset was acute with severe persistent day and noct cough  Variably purulent sputum but did not follow action plan as did not know doxy was refillable but has been using the flutter valve Increased need for saba for sob despite maint rx  with bud/formoterol Still comfortable at rest though now noct cough keeping her up and flares in am's also Doe  = MMRC3 = can't walk 100 yards even at a slow pace at a flat grade s stopping due to sob rec For nasty mucus > flutter and use the doxy x 10 days as needed (call if running out of refills but they should last a year) Prednisone 10 mg take  4 each am x 2 days,   2 each am x 2 days,  1 each am x 2 days and stop     04/06/2017  f/u ov/Lacey Stanton re: obst bronchiectasis/ chronic 02 dep  Chief Complaint  Patient presents with  . Follow-up    Cough is not bothering her at this time, but she has been having increased SOB since Dec 2018.  She states she gets SOB walking a short distance but can not provide an example.    Dyspnea:  [employer] s 02 but freq stops = MMRC3 = can't walk 100 yards even at a slow  pace at a flat grade s stopping due to sob   Cough: no Sleep: ok on 2lpm  SABA use:  Rare/ doesn't help sob rec Drop budesonide to once a day either day or night for a month to see if helps/ hurt voice  Prednisone 10 mg take  4 each am x 2 days,   2 each am x 2 days,  1 each am x 2 days and stop     07/12/2017  f/u ov/Lacey Stanton re: obst bronchiectasis/ noct 02 only / on prn doxy plus brovan/bud  Chief Complaint  Patient presents with  . Follow-up    PFTs, increased SOB, coughing (productive, yellow)  Dyspnea:  Does [employer] shopping = MMRC3 = can't walk 100 yards even at a slow pace at a flat grade s stopping due to sob   Cough: all day long  - has not cleared up completely on doxy x 2 rounds   SABA use:  Rarely   No obvious day to day or daytime variability or assoc   mucus plugs or hemoptysis or cp or chest tightness, subjective wheeze or overt sinus or hb symptoms. No unusual exposure hx or h/o childhood pna/ asthma or knowledge of premature birth.  Sleeping  Ok   @ about hob 30 degrees and 2lpm   without nocturnal  or early am exacerbation  of respiratory  c/o's or need for noct saba. Also denies any obvious fluctuation of symptoms with weather or environmental changes or other aggravating or alleviating factors except as outlined above   Current Allergies, Complete Past Medical History, Past Surgical History, Family History, and Social History were reviewed in [facility] record.  ROS  The following are not active complaints unless bolded Hoarseness, sore throat, dysphagia, dental problems, itching, sneezing,  nasal congestion or discharge of excess mucus or purulent secretions, ear ache,   fever, chills, sweats, unintended wt loss or wt gain, classically pleuritic or exertional cp,  orthopnea pnd or arm/hand swelling  or leg swelling, presyncope, palpitations, abdominal pain, anorexia, nausea, vomiting, diarrhea  or change in bowel habits or change in bladder habits,  change in stools or change in urine, dysuria, hematuria,  rash, arthralgias, visual complaints, headache, numbness, weakness or ataxia or problems with walking or coordination using walker,  change in mood or  memory.        Current Meds  Medication Sig  . albuterol (PROAIR HFA) 108 (90 Base)  MCG/ACT inhaler Inhale 2 puffs into the lungs every 4 (four) hours as needed for wheezing.  Marland Kitchen arformoterol (BROVANA) 15 MCG/2ML NEBU Take 2 mLs (15 mcg total) by nebulization 2 (two) times daily.  Marland Kitchen BIOTIN PO Take 1,000 mg by mouth daily.  . budesonide (PULMICORT) 0.5 MG/2ML nebulizer solution Take 2 mLs (0.5 mg total) by nebulization 2 (two) times daily.  . Cholecalciferol (VITAMIN D-3) 5000 units TABS Take 1 tablet by mouth daily.  Marland Kitchen co-enzyme Q-10 30 MG capsule Take by mouth daily.    Marland Kitchen levothyroxine (SYNTHROID, LEVOTHROID) 50 MCG tablet Take by mouth daily.    . Melatonin 10 MG TABS Take 1 tablet by mouth at bedtime.  . OXYGEN 2 lpm with sleep and as needed during the day  . polyethylene glycol (MIRALAX / GLYCOLAX) packet Take 17 g by mouth daily.  .     . Prenatal Vit-Fe Sulfate-FA (PRENATAL MULTIVIT-IRON PO) Take 1 tablet by mouth daily.  . Probiotic CAPS Take 1 capsule by mouth daily.  Marland Kitchen Respiratory Therapy Supplies (FLUTTER) DEVI Use as directed  . traMADol-acetaminophen (ULTRACET) 37.5-325 MG per tablet Take 1-2 tablets by mouth every 6 (six) hours as needed.                  Past Medical History:  Bronchiectasis  - CT 11/07/08: Bronchiectasis in the lingular > RML plus small HH  - Sinus CT neg 04/17/09  - HFA 25 > 75% effective April 15, 2009  -PFT's 09/16/2010   FEV1  .89( 55%)  Ratio 49  No better with B2,  DLCO 62 > improved to 107 Pneumonia > cleared radiographically 07/01/2010     - Pneumovax March 2011  Allergic Rhinitis  Pulmonary hemorrhage 1979   Osteoarthritis      - s/p  L TKR 02/2000 >>  Difficult recovery Hypothyroidism  GERD diet                Objective:    Physical Exam  Thin amb wf nad   Wt 125 08/03/2010  > 122 09/16/2010  > 128 12/09/2010 > 03/15/2011  133> 4/09811  129> 11/08/2012 131 >  10/22/2014   129 > 02/10/2015  113 > 07/28/2015 107 > 02/29/2016  113 > 07/02/2016 112 > 10/05/2016  107 > 04/06/2017   118 > 07/12/2017   117     Vital signs reviewed - Note on arrival 02 sats  94% on RA           HEENT: nl dentition / oropharynx. Nl external ear canals without cough reflex - moderate bilateral non-specific turbinate edema     NECK :  without JVD/Nodes/TM/ nl carotid upstrokes bilaterally   LUNGS: no acc muscle use,   Mod kyphotic  contour chest wall with bilateral  Distant insp/exp rhonchi   without cough on insp or exp maneuver and mild   Hyperresonant  to  percussion bilaterally     CV:  RRR  no s3   III/ VI sem  or increase in P2, and trace sym pedal edema bilaterally   ABD:  soft and nontender with pos mid insp Hoover's  in the supine position. No bruits or organomegaly appreciated, bowel sounds nl  MS:     ext warm without deformities, calf tenderness, cyanosis or clubbing No obvious joint restrictions   SKIN: warm and dry without lesions    NEURO:  alert, approp, nl sensorium with  no motor or cerebellar deficits apparent.  CXR PA and Lateral:   07/12/2017 :    I personally reviewed images and agree with radiology impression as follows:   Chronic bronchitic-bronchiectatic changes. No pneumonia, CHF, nor other acute cardiopulmonary abnormality.     Labs ordered/ reviewed:      Chemistry      Component Value Date/Time   NA 140 07/12/2017 1042   K 4.5 07/12/2017 1042   CL 104 07/12/2017 1042   CO2 29 07/12/2017 1042   BUN 19 07/12/2017 1042   CREATININE 0.79 07/12/2017 1042      Component Value Date/Time   CALCIUM 9.9 07/12/2017 1042   ALKPHOS 55 07/12/2017 1042   AST 16 07/12/2017 1042   ALT 10 07/12/2017 1042   BILITOT 0.4 07/12/2017 1042        Lab Results  Component Value Date   WBC 6.0  07/12/2017   HGB 11.6 (L) 07/12/2017   HCT 34.5 (L) 07/12/2017   MCV 92.3 07/12/2017   PLT 289.0 07/12/2017       EOS                                                               0.2                                    07/12/2017        Lab Results  Component Value Date   TSH 1.01 07/12/2017     Lab Results  Component Value Date   PROBNP 52.0 07/12/2017               Assessment & Plan:

## 2017-07-12 NOTE — Progress Notes (Signed)
Spoke with pt and notified of results per Dr. Wert. Pt verbalized understanding and denied any questions. 

## 2017-07-12 NOTE — Assessment & Plan Note (Addendum)
-   CT 11/07/08: Bronchiectasis in the lingula  > rml plus small HH  - Sinus CT neg 04/17/09   -PFT's 09/16/2010   FEV1  .89( 55%)  Ratio 49  No better with B2,  DLCO 62 > improved to 107 - PFTs  08/10/2012    FEV1  0.78 (50%) ratio 58,  No better p B2  DLCO 55 improved to 86% - Alpha one AT genotype 07/07/10: MM CT chest>   03/16/13 bronchiectasis with nodules - Spirometry 02/10/2015  FEV1  0.54 (33%) ratio 53   - PFT's  07/28/2015  FEV1 0.82 (56 % ) ratio 57  p 3 % improvement from saba p laba/ics neb  prior to study with DLCO  48/53 % corrects to 83 % for alv volume   - 07/02/16 rec doxy prn flares    - PFT's  07/12/2017  FEV1 0.65 (48 % ) ratio 59  p no % improvement from saba p nothing prior to study with DLCO  45 % corrects to 75 % for alv volume  - 07/12/2017  After extensive coaching inhaler device  effectiveness =    90% from a baseline of 75% (short Ti)     Continues to have severe airflow obstruction and would be more symptomatic if more active - actually  cough is her main concern now so rec add back gerd rx empirically and try cipro if tolerates x 10 days for purulent sputum since doxy no longer effective.  No change meds otherwise for now - f/u q 3 m, sooner prn  I had an extended discussion with the patient reviewing all relevant studies completed to date and  lasting 15 to 20 minutes of a 25 minute visit    See device teaching which extended face to face time for this visit.  Each maintenance medication was reviewed in detail including emphasizing most importantly the difference between maintenance and prns and under what circumstances the prns are to be triggered using an action plan format that is not reflected in the computer generated alphabetically organized AVS which I have not found useful in most complex patients, especially with respiratory illnesses  Please see AVS for specific instructions unique to this visit that I personally wrote and verbalized to the the pt in detail and  then reviewed with pt  by my nurse highlighting any  changes in therapy recommended at today's visit to their plan of care.

## 2017-07-13 NOTE — Progress Notes (Signed)
Spoke with pt and notified of results per Dr. Wert. Pt verbalized understanding and denied any questions. 

## 2017-07-14 ENCOUNTER — Telehealth: Payer: Self-pay | Admitting: Internal Medicine

## 2017-07-14 NOTE — Telephone Encounter (Signed)
Spoke with pt. States that she is scared to take Cipro that was given to her at her last OV. MW instructed the pt to cut the tablets in half but the packet insert states not to cut them in half. The packet insert also listed all of the side effects that could come along with the medication and it also stated that if you are over the age of 82 you are more prone to have most of the side effects. She is requesting an alternative.  MW - please advise. Thanks.

## 2017-07-14 NOTE — Telephone Encounter (Signed)
cipro was the only alternative as can't tol the others  The reason for the cutting in half in was just to see if she she had any side effects which are unlikely if takes with food   If tolerates a few doses then can try a full dose same way.   None of the side effects cause serious or irreversible problems, they go away immediately when stop it  I'm not going to comment on this further and if wants a second opinion refer to ID  Next

## 2017-07-14 NOTE — Telephone Encounter (Signed)
Pt is returning call. Cb is [phone]. Pt will be leaving home around 3PM.

## 2017-07-14 NOTE — Telephone Encounter (Signed)
Attempted to call pt but unable to reach her.  Left message for pt to return call x1  

## 2017-07-14 NOTE — Telephone Encounter (Signed)
Spoke with pt. She is aware of Dr. Wert's response. Nothing further was needed. 

## 2017-07-18 ENCOUNTER — Encounter: Payer: Self-pay | Admitting: Internal Medicine

## 2017-07-18 NOTE — Assessment & Plan Note (Signed)
No evidence of signifiant anemia, chf/ thryoid or kidney dz by today's labs

## 2017-08-05 ENCOUNTER — Telehealth: Payer: Self-pay | Admitting: Internal Medicine

## 2017-08-05 MED ORDER — PREDNISONE 10 MG PO TABS: ORAL_TABLET | ORAL | 0 refills | Status: DC

## 2017-08-05 NOTE — Telephone Encounter (Signed)
Pt is returning call. Cb is [phone].

## 2017-08-05 NOTE — Telephone Encounter (Signed)
Attempted to call patient today regarding MW recommendations for her cough. I did not receive an answer at time of call. I have left a voicemail message for pt to return call. X1  Will leave in triage box and try to call patient again.

## 2017-08-05 NOTE — Telephone Encounter (Signed)
Called and spoke with patient, she states that she can tolerate the medication. rx sent in. Nothing further needed.

## 2017-08-05 NOTE — Telephone Encounter (Signed)
If tol cipro should repeat it with Prednisone 10 mg take  4 each am x 2 days,   2 each am x 2 days,  1 each am x 2 days and stop - if  can't tol cipro then can't treat over the phone as failed doxy and multiple allergies so will need ov with all meds in hand to regroup

## 2017-08-05 NOTE — Telephone Encounter (Signed)
Called and spoke with pt regarding increase of coughing more at night time. Pt reports that she has productive cough- yellow/clear, and some SOB with exertion in last 7 days. Pt was last seen by MW on 07/12/17; ROV is 10/13/17 Pt is requesting something to better help her increase of coughing at bedtime.  MW please advise.

## 2017-08-05 NOTE — Telephone Encounter (Signed)
Pt is calling back [phone] 

## 2017-08-05 NOTE — Telephone Encounter (Signed)
Attempted to contact pt. I did not receive an answer. There was no option for me to leave a message. Will try back.  

## 2017-08-05 NOTE — Telephone Encounter (Signed)
Called patient, unable to reach left message to give us a call back. 

## 2017-08-11 ENCOUNTER — Telehealth: Payer: Self-pay | Admitting: Internal Medicine

## 2017-08-11 NOTE — Telephone Encounter (Signed)
Called and spoke with pt regarding not feeling better Pt has tried Doxcy twice, cipro once and no better Pt reports productive cough-yellow with smell and bad taste, SOB with exertion, chest tightness, and chills. Pt had iron infusion this morning, denies taking Mucinex  Offered pt to be seen since these different rounds of abx not working. Scheduled appt with TP 08/12/17 at 9am. Nothing further needed at this time.

## 2017-08-12 ENCOUNTER — Ambulatory Visit (INDEPENDENT_AMBULATORY_CARE_PROVIDER_SITE_OTHER): Payer: Medicare Other | Admitting: Adult Health

## 2017-08-12 ENCOUNTER — Encounter: Payer: Self-pay | Admitting: Adult Health

## 2017-08-12 DIAGNOSIS — J479 Bronchiectasis, uncomplicated: Secondary | ICD-10-CM

## 2017-08-12 MED ORDER — ALBUTEROL SULFATE (2.5 MG/3ML) 0.083% IN NEBU: 2.5000 mg | INHALATION_SOLUTION | RESPIRATORY_TRACT | 5 refills | Status: DC | PRN

## 2017-08-12 MED ORDER — ALBUTEROL SULFATE (2.5 MG/3ML) 0.083% IN NEBU: 2.5000 mg | INHALATION_SOLUTION | Freq: Four times a day (QID) | RESPIRATORY_TRACT | 12 refills | Status: DC | PRN

## 2017-08-12 NOTE — Telephone Encounter (Signed)
Error

## 2017-08-12 NOTE — Assessment & Plan Note (Addendum)
Recent slow to resolve bronchiectatic exacerbation now resolving. Patient is encouraged to continue with flutter valve and nebulizer regimen. We will add albuterol nebulizer to use as needed Will check sputum cx if sx return   Plan  Patient Instructions  Continue on Brovana and Budesonide Neb Twice daily  .  Use Mucinex Twice daily  As needed  Cough/congestion  Use Flutter valve several times a day  May use Albuterol Neb every 4hr as needed for increased shortness of breath .  Sputum culture- if your cough/congestion increases, may return as directed.  Follow up Dr. Sherene SiresWert  In 2-3 months and As needed   Please contact office for sooner follow up if symptoms do not improve or worsen or seek emergency care

## 2017-08-12 NOTE — Progress Notes (Signed)
 @Patient  ID: Lacey Stanton, female    DOB: 11/18/1926, 84 y.o.   MRN: [id number]  Chief Complaint  Patient presents with  . Acute Visit    Bronchiectasis     Referring provider: No ref. provider found  HPI: 84 year old female never smoker followed for obstructive bronchiectasis  TEST  CT 11/07/08: Bronchiectasis in the lingula  > rml plus small HH  - Sinus CT neg 04/17/09   -PFT's 09/16/2010   FEV1  .89( 55%)  Ratio 49  No better with B2,  DLCO 62 > improved to 107 - PFTs  08/10/2012    FEV1  0.78 (50%) ratio 58,  No better p B2  DLCO 55 improved to 86% - Alpha one AT genotype 07/07/10: MM CT chest>   03/16/13 bronchiectasis with nodules - Spirometry 02/10/2015  FEV1  0.54 (33%) ratio 53   - PFT's  07/28/2015  FEV1 0.82 (56 % ) ratio 57  p 3 % improvement from saba p laba/ics neb  prior to study with DLCO  48/53 % corrects to 83 % for alv volume    - PFT's  07/12/2017  FEV1 0.65 (48 % ) ratio 59  p no % improvement from saba p nothing prior to study with DLCO  45 % corrects to 75 % for alv volume   08/12/2017 Follow up : Bronchiectasis  Patient returns for a follow-up for bronchiectasis.  Has been having slow to resolve flare . Says last 2 days cough has been better , [name] , [name]   Allergies  Allergen Reactions  . Amoxicillin     REACTION: hives  . Avelox [Moxifloxacin Hcl In Nacl] Itching  . Iron     Per pt can only tolerate iron injection  . Levaquin [Levofloxacin In D5w] Nausea Only  . Statins Other (See  Comments)    Joint pain  . Sulfa Antibiotics     Unknown reaction  . Symbicort [Budesonide-Formoterol Fumarate]     Pt reports she cannot take this d/t having glaucoma.  Has tried this and it affected eyes (eye pain).    Immunization History  Administered Date(s) Administered  . Influenza Split 11/07/2012, 11/08/2013, 10/01/2014  . Influenza Whole 10/25/2009, 10/26/2011, 10/26/2015  . Influenza, High Dose Seasonal PF 10/13/2015, 11/12/2016  . Pneumococcal Conjugate-13 08/07/2013  . Pneumococcal Polysaccharide-23 04/07/2009    Past Medical History:  Diagnosis Date  . Allergic rhinitis   . Bronchiectasis    CT 11/07/08: bronchiectasis in the lingular > rml plus small HH.  Sinus CT ne 04/17/09.  HFA 25>75% effective April 15, 2009  . GERD (gastroesophageal reflux disease)   . Hypothyroidism   . Osteoarthritis   . Pneumonia   . Pulmonary hemorrhage 1979    Tobacco History: Social History   Tobacco Use  Smoking Status Never Smoker  Smokeless Tobacco Never Used   Counseling given: Not Answered   Outpatient Medications Prior to Visit  Medication Sig Dispense Refill  . albuterol (PROAIR  HFA) 108 (90 Base) MCG/ACT inhaler Inhale 2 puffs into the lungs every 4 (four) hours as needed for wheezing. 1 Inhaler 1  . arformoterol (BROVANA) 15 MCG/2ML NEBU Take 2 mLs (15 mcg total) by nebulization 2 (two) times daily. 120 mL 5  . BIOTIN PO Take 1,000 mg by mouth daily.    . budesonide (PULMICORT) 0.5 MG/2ML nebulizer solution Take 2 mLs (0.5 mg total) by nebulization 2 (two) times daily. 120 mL 5  . Cholecalciferol (VITAMIN D-3) 5000 units TABS Take 1 tablet by mouth daily.    Marland Kitchen co-enzyme Q-10 30 MG capsule Take by mouth daily.      . famotidine (PEPCID) 20 MG tablet One at bedtime 30 tablet 11  . levothyroxine (SYNTHROID, LEVOTHROID) 50 MCG tablet Take by mouth daily.      . Melatonin 10 MG TABS Take 1 tablet by mouth at bedtime.    . OXYGEN 2 lpm with sleep and as needed during the  day    . pantoprazole (PROTONIX) 40 MG tablet Take 1 tablet (40 mg total) by mouth daily. Take 30-60 min before first meal of the day 30 tablet 2  . polyethylene glycol (MIRALAX / GLYCOLAX) packet Take 17 g by mouth daily.    . Prenatal Vit-Fe Sulfate-FA (PRENATAL MULTIVIT-IRON PO) Take 1 tablet by mouth daily.    . Probiotic CAPS Take 1 capsule by mouth daily.    Marland Kitchen Respiratory Therapy Supplies (FLUTTER) DEVI Use as directed 1 each 0  . traMADol-acetaminophen (ULTRACET) 37.5-325 MG per tablet Take 1-2 tablets by mouth every 6 (six) hours as needed.      . ciprofloxacin (CIPRO) 750 MG tablet Take 1 tablet (750 mg total) by mouth 2 (two) times daily. 20 tablet 11  . predniSONE (DELTASONE) 10 MG tablet 4 each am x 2 days,   2 each am x 2 days,  1 each am x 2 days and stop 15 tablet 0   No facility-administered medications prior to visit.      Review of Systems  Constitutional:   No  weight loss, night sweats,  Fevers, chills,  +fatigue, or  lassitude.  HEENT:   No headaches,  Difficulty swallowing,  Tooth/dental problems, or  Sore throat,                No sneezing, itching, ear ache, nasal congestion, post nasal drip,   CV:  No chest pain,  Orthopnea, PND, swelling in lower extremities, anasarca, dizziness, palpitations, syncope.   GI  No heartburn, indigestion, abdominal pain, nausea, vomiting, diarrhea, change in bowel habits, loss of appetite, bloody stools.   Resp:  No chest wall deformity  Skin: no rash or lesions.  GU: no dysuria, change in color of urine, no urgency or frequency.  No flank pain, no hematuria   MS:  No joint pain or swelling.  No decreased range of motion.  No back pain.    Physical Exam  BP 130/72 (BP Location: Left Arm, Cuff Size: Normal)   Pulse 65   Temp 98.1 F (36.7 C) (Oral)   Ht 5\' 2"  (1.575 m)   Wt 118 lb 3.2 oz (53.6 kg)   SpO2 96%   BMI 21.62 kg/m   GEN: A/Ox3; pleasant , NAD, thin and elderly    HEENT:  [place]/AT,  EACs-clear, TMs-wnl,  NOSE-clear, THROAT-clear, no lesions, no postnasal drip or exudate noted.   NECK:  Supple w/ fair ROM; no JVD; normal carotid impulses w/o bruits; no thyromegaly or  nodules palpated; no lymphadenopathy.    RESP  Few trace rhonchi ,  no accessory muscle use, no dullness to percussion  CARD:  RRR, no m/r/g, no peripheral edema, pulses intact, no cyanosis or clubbing.  GI:   Soft & nt; nml bowel sounds; no organomegaly or masses detected.   Musco: Warm bil, no deformities or joint swelling noted.   Neuro: alert, no focal deficits noted.    Skin: Warm, no lesions or rashes    Lab Results:  CBC    Component Value Date/Time   WBC 6.0 07/12/2017 1042   RBC 3.74 (L) 07/12/2017 1042   HGB 11.6 (L) 07/12/2017 1042   HCT 34.5 (L) 07/12/2017 1042   PLT 289.0 07/12/2017 1042   MCV 92.3 07/12/2017 1042   MCHC 33.6 07/12/2017 1042   RDW 14.7 07/12/2017 1042   LYMPHSABS 1.8 07/12/2017 1042   MONOABS 0.5 07/12/2017 1042   EOSABS 0.2 07/12/2017 1042   BASOSABS 0.1 07/12/2017 1042    BMET    Component Value Date/Time   NA 140 07/12/2017 1042   K 4.5 07/12/2017 1042   CL 104 07/12/2017 1042   CO2 29 07/12/2017 1042   GLUCOSE 96 07/12/2017 1042   BUN 19 07/12/2017 1042   CREATININE 0.79 07/12/2017 1042   CALCIUM 9.9 07/12/2017 1042   GFRNONAA 57 01/12/2007 0000   GFRAA 69 01/12/2007 0000    BNP No results found for: BNP  ProBNP    Component Value Date/Time   PROBNP 52.0 07/12/2017 1042    Imaging: No results found.   Assessment & Plan:   Obstructive bronchiectasis (HCC) Recent slow to resolve bronchiectatic exacerbation now resolving. Patient is encouraged to continue with flutter valve and nebulizer regimen. We will add albuterol nebulizer to use as needed Will check sputum cx if sx return   Plan  Patient Instructions  Continue on Brovana and Budesonide Neb Twice daily  .  Use Mucinex Twice daily  As needed  Cough/congestion  Use Flutter valve several times a  day  May use Albuterol Neb every 4hr as needed for increased shortness of breath .  Sputum culture- if your cough/congestion increases, may return as directed.  Follow up Dr. Sherene Sires  In 2-3 months and As needed   Please contact office for sooner follow up if symptoms do not improve or worsen or seek emergency care          Rubye Oaks, NP 08/12/2017

## 2017-08-12 NOTE — Addendum Note (Signed)
Addended by: Boone MasterJONES, Giana Castner E on: 08/12/2017 01:10 PM   Modules accepted: Orders

## 2017-08-12 NOTE — Patient Instructions (Signed)
Continue on Brovana and Budesonide Neb Twice daily  .  Use Mucinex Twice daily  As needed  Cough/congestion  Use Flutter valve several times a day  May use Albuterol Neb every 4hr as needed for increased shortness of breath .  Sputum culture- if your cough/congestion increases, may return as directed.  Follow up Dr. Sherene SiresWert  In 2-3 months and As needed   Please contact office for sooner follow up if symptoms do not improve or worsen or seek emergency care

## 2017-08-13 NOTE — Progress Notes (Signed)
Chart and office note reviewed in detail  > agree with a/p as outlined    

## 2017-08-15 ENCOUNTER — Other Ambulatory Visit: Payer: Medicare Other

## 2017-08-15 DIAGNOSIS — J479 Bronchiectasis, uncomplicated: Secondary | ICD-10-CM

## 2017-08-18 ENCOUNTER — Telehealth: Payer: Self-pay | Admitting: Internal Medicine

## 2017-08-18 LAB — RESPIRATORY CULTURE OR RESPIRATORY AND SPUTUM CULTURE
MICRO NUMBER:: 90864135
RESULT: NORMAL
SPECIMEN QUALITY:: ADEQUATE

## 2017-08-18 NOTE — Telephone Encounter (Signed)
Notes recorded by Julio SicksParrett, Tammy S, NP on 08/18/2017 at 12:58 PM EDT Sputum cx is normal  Cont on current regimen .  Please contact office for sooner follow up if symptoms do not improve or worsen or seek emergency care   Spoke with pt and notified of results per TP. Pt verbalized understanding and denied any questions.

## 2017-08-18 NOTE — Progress Notes (Signed)
Spoke with pt and notified of results per Tammy Parrett, NP. Pt verbalized understanding and denied any questions. 

## 2017-08-24 DIAGNOSIS — R195 Other fecal abnormalities: Secondary | ICD-10-CM | POA: Insufficient documentation

## 2017-10-11 ENCOUNTER — Telehealth: Payer: Self-pay | Admitting: Internal Medicine

## 2017-10-11 DIAGNOSIS — R0609 Other forms of dyspnea: Principal | ICD-10-CM

## 2017-10-11 MED ORDER — ARFORMOTEROL TARTRATE 15 MCG/2ML IN NEBU: 15.0000 ug | INHALATION_SOLUTION | Freq: Two times a day (BID) | RESPIRATORY_TRACT | 5 refills | Status: DC

## 2017-10-11 NOTE — Telephone Encounter (Signed)
Pt is calling back [phone]

## 2017-10-11 NOTE — Telephone Encounter (Signed)
LMTCB. Rosalyn GessBrovana has been sent to [facility].

## 2017-10-11 NOTE — Telephone Encounter (Signed)
lmtcb x1 for pt. 

## 2017-10-11 NOTE — Telephone Encounter (Signed)
Patient called back, patient reports [facility]acey [facility] Order sent to correct pharmacy. Nothing further needed at this time.

## 2017-10-12 ENCOUNTER — Telehealth: Payer: Self-pay | Admitting: Internal Medicine

## 2017-10-12 NOTE — Telephone Encounter (Signed)
Advised patient that it was sent to wrong pharmacy but has been corrected. Nothing further needed.

## 2017-10-13 ENCOUNTER — Ambulatory Visit: Payer: Medicare Other | Admitting: Internal Medicine

## 2017-10-18 ENCOUNTER — Ambulatory Visit (INDEPENDENT_AMBULATORY_CARE_PROVIDER_SITE_OTHER): Payer: Medicare Other | Admitting: Internal Medicine

## 2017-10-18 ENCOUNTER — Ambulatory Visit (INDEPENDENT_AMBULATORY_CARE_PROVIDER_SITE_OTHER)
Admission: RE | Admit: 2017-10-18 | Discharge: 2017-10-18 | Disposition: A | Discharge status: Home / Self Care | Payer: Medicare Other | Source: Ambulatory Visit | Attending: Internal Medicine | Admitting: Internal Medicine

## 2017-10-18 ENCOUNTER — Encounter: Payer: Self-pay | Admitting: Internal Medicine

## 2017-10-18 VITALS — BP 138/80 | HR 74 | Ht 61.0 in | Wt 113.0 lb

## 2017-10-18 DIAGNOSIS — J479 Bronchiectasis, uncomplicated: Secondary | ICD-10-CM | POA: Diagnosis not present

## 2017-10-18 DIAGNOSIS — J9611 Chronic respiratory failure with hypoxia: Secondary | ICD-10-CM

## 2017-10-18 MED ORDER — ALBUTEROL SULFATE (2.5 MG/3ML) 0.083% IN NEBU: 2.5000 mg | INHALATION_SOLUTION | RESPIRATORY_TRACT | 12 refills | Status: DC | PRN

## 2017-10-18 MED ORDER — PREDNISONE 10 MG PO TABS: ORAL_TABLET | ORAL | 0 refills | Status: DC

## 2017-10-18 NOTE — Progress Notes (Signed)
Subjective:    Patient ID: Lacey Stanton, female    DOB: Apr 05, 1926   MRN: [id number]   Brief patient profile:  65 yowf never smoker with obstructive  bronchiectasis with resp symptoms dating back to  the 1980's of cough/sob    History of Present Illness  06/29/2012 f/u ov/Tre Sanker re bronchiectasis on 02 just at hs and bud/brovana bid maint rx Chief Complaint  Patient presents with  . Follow-up    Pt c/o increased SOB for the past month. She feels that nebs are not lasting long enough any longer.   at baseline maintaining brovana budesonide and rarely needing saba hfa Then one month pta fever no cough but sob and increased proaire and used 02  > ov with Dr Martha Clan rx Avelox and fever resolved but not breathing to her satisfaction and using proaire 2 x daily but no neb saba. Doe x > slow adls rec increase zantac (ranitidine) to 150 mg after bfast and after supper  GERD  diet Continue to use the nebulizer brovana and budesonide and then 12 hours later. Prednisone 10 mg take  4 each am x 2 days,   2 each am x 2 days,  1 each am x2days and stop  Work on inhaler technique    Only use your albuterol (proaire) as rescue  02/06/2013 f/u ov/Tashonda Pinkus re: bronchiectasis on maint brovana/ bud plus prn saba hfa Chief Complaint  Patient presents with  . Follow-up    Pt states doing well and denies any new co's today.   typically more congested after lunch and feels her self wheezing so uses albuterol but not really helping much and  not using mucinex much  At all nor the flutter. Not limited by breathing though from desired activities rec Pantoprazole (protonix) 40 mg   Take 30-60 min before first meal of the day and Zantac 150  mg one @ bedtime until return to office GERD diet For cough > flutter valve and mucinex or mucinex dm up 1200 mg every 12hours  Relax and do purse lip breath breathing as much as possible.    07/02/2016  f/u ov/Eward Rutigliano re: obst bronchiectasis / doxy expired / 02 hs/ maint rx  brov/bud/ flutter  Chief Complaint  Patient presents with  . Follow-up    Pt states that she went UC 06/27/16 and was dxed with PNA.  She states that she had been coughing alot and had a fever. She is currently on pred and azithromycin and her cough is much improved.    back to baseline just wondering what do do with next flare and whether really needs to complete a 14 day taper of pred rec for present exac rec Prednisone 10 mg daily with bfast  x 2 days and stop  For flare of cough / congestion/fever > flutter valve and take doxy x 10 days     10/05/2016  f/u ov/Herald Vallin re: obst bronchiectasis/ brov/bud and prn saba / ran out of doxy prn  Chief Complaint  Patient presents with  . Acute Visit    Increased cough with sputum production for the past 6 days- sputum dark in the am and lighter throughout the day. She is also having some increased SOB without exertion.   onset was acute with severe persistent day and noct cough  Variably purulent sputum but did not follow action plan as did not know doxy was refillable but has been using the flutter valve Increased need for saba for sob despite maint rx  with bud/formoterol Still comfortable at rest though now noct cough keeping her up and flares in am's also Doe  = MMRC3 = can't walk 100 yards even at a slow pace at a flat grade s stopping due to sob rec For nasty mucus > flutter and use the doxy x 10 days as needed (call if running out of refills but they should last a year) Prednisone 10 mg take  4 each am x 2 days,   2 each am x 2 days,  1 each am x 2 days and stop     04/06/2017  f/u ov/Astin Sayre re: obst bronchiectasis/ chronic 02 dep  Chief Complaint  Patient presents with  . Follow-up    Cough is not bothering her at this time, but she has been having increased SOB since Dec 2018.  She states she gets SOB walking a short distance but can not provide an example.    Dyspnea:  [employer] s 02 but freq stops = MMRC3 = can't walk 100 yards even at a slow  pace at a flat grade s stopping due to sob   Cough: no Sleep: ok on 2lpm  SABA use:  Rare/ doesn't help sob rec Drop budesonide to once a day either day or night for a month to see if helps/ hurt voice  Prednisone 10 mg take  4 each am x 2 days,   2 each am x 2 days,  1 each am x 2 days and stop     07/12/2017  f/u ov/Geary Rufo re: obst bronchiectasis/ noct 02 only / on prn doxy plus brovan/bud  Chief Complaint  Patient presents with  . Follow-up    PFTs, increased SOB, coughing (productive, yellow)  Dyspnea:  Does [employer] shopping = MMRC3 = can't walk 100 yards even at a slow pace at a flat grade s stopping due to sob   Cough: all day long  - has not cleared up completely on doxy x 2 rounds  SABA use:  Rarely  rec Pantoprazole (protonix) 40 mg   Take  30-60 min before first meal of the day and Pepcid (famotidine)  20 mg one @  bedtime until return to office - this is the best way to tell whether stomach acid is contributing to your problem.   GERD diet   For nasty mucus >  cipro 750 mg one twice daily x 10 days - try just one half dose with meals to start with to be sure you tolerate it ok  Work on inhaler technique   NP eval 08/12/17  rec Continue on Brovana and Budesonide Neb Twice daily  .  Use Mucinex Twice daily  As needed  Cough/congestion  Use Flutter valve several times a day  May use Albuterol Neb every 4hr as needed for increased shortness of breath .  Sputum culture- if your cough/congestion increases, may return as directed.     10/18/2017  f/u ov/Zachary Nole re: bronchiectasis Chief Complaint  Patient presents with  . Follow-up    Breathing is "okay". She states still coughing up yellow sputum.  She is using her albuterol inhaler 2 x per wk.   Dyspnea:  MMRC2 = can't walk a nl pace on a flat grade s sob but does fine slow and flat / slowed by coughing Cough: minimal yellow esp am  Sleeping: ok on back 2 pillows / bed flat  SABA use: twice a week 02 2lpm hs / rarely uses  during the day  No obvious day to day or daytime variability or assoc excess/ purulent sputum or mucus plugs or hemoptysis or cp or chest tightness, subjective wheeze or overt sinus or hb symptoms.   Sleeping as above without nocturnal  or early am exacerbation  of respiratory  c/o's or need for noct saba. Also denies any obvious fluctuation of symptoms with weather or environmental changes or other aggravating or alleviating factors except as outlined above   No unusual exposure hx or h/o childhood pna/ asthma or knowledge of premature birth.  Current Allergies, Complete Past Medical History, Past Surgical History, Family History, and Social History were reviewed in [facility] electronic medical record.  ROS  The following are not active complaints unless bolded Hoarseness, sore throat, dysphagia, dental problems, itching, sneezing,  nasal congestion or discharge of excess mucus or purulent secretions, ear ache,   fever, chills, sweats, unintended wt loss or wt gain, classically pleuritic or exertional cp,  orthopnea pnd or arm/hand swelling  or leg swelling, presyncope, palpitations, abdominal pain, anorexia, nausea, vomiting, diarrhea  or change in bowel habits or change in bladder habits, change in stools or change in urine, dysuria, hematuria,  rash, arthralgias, visual complaints, headache, numbness, weakness or ataxia or problems with walking or coordination,  change in mood or  memory.        Current Meds  Medication Sig  . albuterol (PROAIR HFA) 108 (90 Base) MCG/ACT inhaler Inhale 2 puffs into the lungs every 4 (four) hours as needed for wheezing.  Marland Kitchen. arformoterol (BROVANA) 15 MCG/2ML NEBU Take 2 mLs (15 mcg total) by nebulization 2 (two) times daily.  Marland Kitchen. b complex vitamins tablet Take 1 tablet by mouth daily.  Marland Kitchen. BIOTIN PO Take 1,000 mg by mouth daily.  . budesonide (PULMICORT) 0.5 MG/2ML nebulizer solution Take 2 mLs (0.5 mg total) by nebulization 2 (two) times daily.  .  Cholecalciferol (VITAMIN D-3) 5000 units TABS Take 1 tablet by mouth daily.  Marland Kitchen. co-enzyme Q-10 30 MG capsule Take by mouth daily.    . famotidine (PEPCID) 20 MG tablet One at bedtime  . levothyroxine (SYNTHROID, LEVOTHROID) 50 MCG tablet Take by mouth daily.    . Melatonin 10 MG TABS Take 1 tablet by mouth at bedtime.  . OXYGEN 2 lpm with sleep and as needed during the day  . pantoprazole (PROTONIX) 40 MG tablet Take 1 tablet (40 mg total) by mouth daily. Take 30-60 min before first meal of the day  . polyethylene glycol (MIRALAX / GLYCOLAX) packet Take 17 g by mouth daily.  . Prenatal Vit-Fe Sulfate-FA (PRENATAL MULTIVIT-IRON PO) Take 1 tablet by mouth daily.  . Probiotic CAPS Take 1 capsule by mouth daily.  Marland Kitchen. Respiratory Therapy Supplies (FLUTTER) DEVI Use as directed  . verapamil (CALAN-SR) 120 MG CR tablet Take 120 mg by mouth at bedtime.                  Past Medical History:  Bronchiectasis  - CT 11/07/08: Bronchiectasis in the lingular > RML plus small HH  - Sinus CT neg 04/17/09  - HFA 25 > 75% effective April 15, 2009  -PFT's 09/16/2010   FEV1  .89( 55%)  Ratio 49  No better with B2,  DLCO 62 > improved to 107 Pneumonia > cleared radiographically 07/01/2010     - Pneumovax March 2011  Allergic Rhinitis  Pulmonary hemorrhage 1979   Osteoarthritis      - s/p  L TKR 02/2000 >>  Difficult recovery Hypothyroidism  GERD diet                Objective:   Physical Exam  amb wf with 2 wheeled walker / very easily confused with details of care eg maint/ prns  Wt 125 08/03/2010  > 122 09/16/2010  > 128 12/09/2010 > 03/15/2011  133> 1/61096  129> 11/08/2012 131 >  10/22/2014   129 > 02/10/2015  113 > 07/28/2015 107 > 02/29/2016  113 > 07/02/2016 112 > 10/05/2016  107 > 04/06/2017   118 > 07/12/2017   117 > 10/18/2017  113      Vital signs reviewed - Note on arrival 02 sats  94% on RA            HEENT: nl dentition, turbinates bilaterally, and oropharynx. Nl external ear canals  without cough reflex   NECK :  without JVD/Nodes/TM/ nl carotid upstrokes bilaterally   LUNGS: no acc muscle use,  Mod kyphotic chest/ minimal insp/exp rhonchi   CV:  RRR  no s3 or III/VI sem s  increase in P2, and no edema   ABD:  soft and nontender with nl inspiratory excursion in the supine position. No bruits or organomegaly appreciated, bowel sounds nl  MS:  Walks bent over walker slow pace / ext warm without deformities, calf tenderness, cyanosis or clubbing No obvious joint restrictions   SKIN: warm and dry without lesions    NEURO:  alert, approp, nl sensorium with  no motor or cerebellar deficits apparent.             CXR PA and Lateral:   10/18/2017 :    I personally reviewed images and agree with radiology impression as follows:    Hyperinflation with bronchitis/bronchiectasis. Stable opacification from 07/12/2017  My impression: c/w lingular scarring by ct 2015     Assessment & Plan:

## 2017-10-18 NOTE — Patient Instructions (Addendum)
Plan A = Automatic =  Brovana / Budesonide twice daily    Plan B = Backup for breathing/ congestion Only use your albuterol as a rescue medication to be used if you can't catch your breath by resting or doing a relaxed purse lip breathing pattern.  - The less you use it, the better it will work when you need it. - Ok to use the inhaler up to 2 puffs  every 4 hours if you must but call for appointment if use goes up over your usual need - Don't leave home without it !!  (think of it like the spare tire for your car)   Plan C = Crisis - only use your albuterol nebulizer if you first try Plan B and it fails to help > ok to use the nebulizer up to every 4 hours but if start needing it regularly call for immediate appointment    For cough / congestion > mucinex dm up to 1200 mg every 12 hours and use the flutter valve as much as possible   Prednisone 10 mg take  4 each am x 2 days,   2 each am x 2 days,  1 each am x 2 days and stop    Please remember to go to the  x-ray department downstairs in the basement  for your tests - we will call you with the results when they are available.   Please schedule a follow up office visit in 6 weeks, call sooner if needed

## 2017-10-19 ENCOUNTER — Encounter: Payer: Self-pay | Admitting: Internal Medicine

## 2017-10-19 ENCOUNTER — Other Ambulatory Visit: Payer: Self-pay | Admitting: Internal Medicine

## 2017-10-19 NOTE — Progress Notes (Signed)
Spoke with pt and notified of results per Dr. Wert. Pt verbalized understanding and denied any questions. 

## 2017-10-19 NOTE — Assessment & Plan Note (Addendum)
-   CT 11/07/08: Bronchiectasis in the lingula  > rml plus small HH  - Sinus CT neg 04/17/09   -PFT's 09/16/2010   FEV1  .89( 55%)  Ratio 49  No better with B2,  DLCO 62 > improved to 107 - PFTs  08/10/2012    FEV1  0.78 (50%) ratio 58,  No better p B2  DLCO 55 improved to 86% - Alpha one AT genotype 07/07/10: MM CT chest>   03/16/13 bronchiectasis with nodules - Spirometry 02/10/2015  FEV1  0.54 (33%) ratio 53   - PFT's  07/28/2015  FEV1 0.82 (56 % ) ratio 57  p 3 % improvement from saba p laba/ics neb  prior to study with DLCO  48/53 % corrects to 83 % for alv volume   - 07/02/16 rec doxy prn flares    - PFT's  07/12/2017  FEV1 0.65 (48 % ) ratio 59  p no % improvement from saba p nothing prior to study with DLCO  45 % corrects to 75 % for alv volume    - 07/12/17 Changed prn abx to cipro 750 bid x 10 days as doxy no longer effective  - 10/18/2017  After extensive coaching inhaler device,  effectiveness =    90% from a baseline of 75%    Mild flare ab component rx : Prednisone 10 mg take  4 each am x 2 days,   2 each am x 2 days,  1 each am x 2 days and stop   Still struggling with details of care so reviewed them line by line with her today using teach back strategy    I had an extended discussion with the patient reviewing all relevant studies completed to date and  lasting 15 to 20 minutes of a 25 minute visit    See device teaching which extended face to face time for this visit.  Each maintenance medication was reviewed in detail including emphasizing most importantly the difference between maintenance and prns and under what circumstances the prns are to be triggered using an action plan format that is not reflected in the computer generated alphabetically organized AVS which I have not found useful in most complex patients, especially with respiratory illnesses  Please see AVS for specific instructions unique to this visit that I personally wrote and verbalized to the the pt in detail and then  reviewed with pt  by my nurse highlighting any  changes in therapy recommended at today's visit to their plan of care.

## 2017-10-19 NOTE — Assessment & Plan Note (Signed)
03/28/13  Patient Saturations on Room Air at Rest = 93% Patient Saturations on [employer] while Ambulating = 88% Patient Saturations on 2 Liters of oxygen while Ambulating = 97% - 10/22/2014  Walked RA x 2 laps @ 185 ft each stopped due sob at slow pace but sats still 90%   - 07/28/2015  Walked RA x 3 laps @ 185 ft each stopped due to  End of study, nl pace, no sob or desat   > d/c amb 02 just using 02 at hs as of 07/2015    Adequate control on present rx, reviewed in detail with pt > no change in rx needed

## 2017-11-08 ENCOUNTER — Telehealth: Payer: Self-pay | Admitting: Internal Medicine

## 2017-11-08 MED ORDER — BUDESONIDE 0.5 MG/2ML IN SUSP: 0.5000 mg | Freq: Two times a day (BID) | RESPIRATORY_TRACT | 5 refills | Status: DC

## 2017-11-08 NOTE — Telephone Encounter (Signed)
Called and spoke with patient advised that medication has been sent to the pharmacy. Nothing further needed.

## 2017-12-05 ENCOUNTER — Encounter: Payer: Self-pay | Admitting: Internal Medicine

## 2017-12-05 ENCOUNTER — Ambulatory Visit (INDEPENDENT_AMBULATORY_CARE_PROVIDER_SITE_OTHER): Payer: Medicare Other | Admitting: Internal Medicine

## 2017-12-05 VITALS — BP 122/76 | HR 66 | Ht 61.0 in | Wt 116.0 lb

## 2017-12-05 DIAGNOSIS — J479 Bronchiectasis, uncomplicated: Secondary | ICD-10-CM

## 2017-12-05 NOTE — Progress Notes (Signed)
Subjective:    Patient ID: Lacey Stanton, female    DOB: December 23, 1926   MRN: [id number]   Brief patient profile:  41 yowf never smoker with obstructive  bronchiectasis with resp symptoms dating back to  the 1980's of cough/sob    History of Present Illness  06/29/2012 f/u ov/Lacey Stanton re bronchiectasis on 02 just at hs and bud/brovana bid maint rx Chief Complaint  Patient presents with  . Follow-up    Pt c/o increased SOB for the past month. She feels that nebs are not lasting long enough any longer.   at baseline maintaining brovana budesonide and rarely needing saba hfa Then one month pta fever no cough but sob and increased proaire and used 02  > ov with Dr Martha Clan rx Avelox and fever resolved but not breathing to her satisfaction and using proaire 2 x daily but no neb saba. Doe x > slow adls rec increase zantac (ranitidine) to 150 mg after bfast and after supper  GERD  diet Continue to use the nebulizer brovana and budesonide and then 12 hours later. Prednisone 10 mg take  4 each am x 2 days,   2 each am x 2 days,  1 each am x2days and stop  Work on inhaler technique    Only use your albuterol (proaire) as rescue  02/06/2013 f/u ov/Lacey Stanton re: bronchiectasis on maint brovana/ bud plus prn saba hfa Chief Complaint  Patient presents with  . Follow-up    Pt states doing well and denies any new co's today.   typically more congested after lunch and feels her self wheezing so uses albuterol but not really helping much and  not using mucinex much  At all nor the flutter. Not limited by breathing though from desired activities rec Pantoprazole (protonix) 40 mg   Take 30-60 min before first meal of the day and Zantac 150  mg one @ bedtime until return to office GERD diet For cough > flutter valve and mucinex or mucinex dm up 1200 mg every 12hours  Relax and do purse lip breath breathing as much as possible.     10/18/2017  f/u ov/Lacey Stanton re: bronchiectasis Chief Complaint  Patient presents with   . Follow-up    Breathing is "okay". She states still coughing up yellow sputum.  She is using her albuterol inhaler 2 x per wk.   Dyspnea:  MMRC2 = can't walk a nl pace on a flat grade s sob but does fine slow and flat / slowed by coughing Cough: minimal yellow esp am  Sleeping: ok on back 2 pillows / bed flat  SABA use: twice a week 02 2lpm hs / rarely uses during the day  rec Plan A = Automatic =  Brovana / Budesonide twice daily  Plan B = Backup for breathing/ congestion Only use your albuterol as a rescue medication Plan C = Crisis - only use your albuterol nebulizer if you first try Plan B and it fails to help > ok to use the nebulizer up to every 4 hours but if start needing it regularly call for immediate appointment For cough / congestion > mucinex dm up to 1200 mg every 12 hours and use the flutter valve as much as possible  Prednisone 10 mg take  4 each am x 2 days,   2 each am x 2 days,  1 each am x 2 days and stop     12/05/2017  f/u ov/Lacey Stanton re:  bronchiectasis Chief Complaint  Patient presents  with  . Follow-up    Breathing is overall doing well. She states she produces dark brown sputum in the am's- but then it turns clear later in the day.    Dyspnea:  MMRC2 = can't walk a nl pace on a flat grade s sob but does fine slow and flat  Cough: esp in ams x 3 weeks  Sleeping: bed flat, 30 degrees with pillow  SABA use: very rarely need any saba 02:  2lpm hs only    No obvious day to day or daytime variability or assoc   mucus plugs or hemoptysis or cp or chest tightness, subjective wheeze or overt sinus or hb symptoms.   Sleeping as above  without nocturnal  exacerbation  of respiratory  c/o's or need for noct saba. Also denies any obvious fluctuation of symptoms with weather or environmental changes or other aggravating or alleviating factors except as outlined above   No unusual exposure hx or h/o childhood pna/ asthma or knowledge of premature birth.  Current  Allergies, Complete Past Medical History, Past Surgical History, Family History, and Social History were reviewed in [facility] record.  ROS  The following are not active complaints unless bolded Hoarseness, sore throat, dysphagia, dental problems, itching, sneezing,  nasal congestion or discharge of excess mucus or purulent secretions, ear ache,   fever, chills, sweats, unintended wt loss or wt gain, classically pleuritic or exertional cp,  orthopnea pnd or arm/hand swelling  or leg swelling, presyncope, palpitations, abdominal pain, anorexia, nausea, vomiting, diarrhea  or change in bowel habits or change in bladder habits, change in stools or change in urine, dysuria, hematuria,  rash, arthralgias, visual complaints, headache, numbness, weakness or ataxia or problems with walking or coordination,  change in mood or  memory.        Current Meds  Medication Sig  . albuterol (PROAIR HFA) 108 (90 Base) MCG/ACT inhaler Inhale 2 puffs into the lungs every 4 (four) hours as needed for wheezing.  Marland Kitchen albuterol (PROVENTIL) (2.5 MG/3ML) 0.083% nebulizer solution Take 3 mLs (2.5 mg total) by nebulization every 4 (four) hours as needed for wheezing or shortness of breath.  Marland Kitchen arformoterol (BROVANA) 15 MCG/2ML NEBU Take 2 mLs (15 mcg total) by nebulization 2 (two) times daily.  Marland Kitchen b complex vitamins tablet Take 1 tablet by mouth daily.  Marland Kitchen BIOTIN PO Take 1,000 mg by mouth daily.  . budesonide (PULMICORT) 0.5 MG/2ML nebulizer solution Take 2 mLs (0.5 mg total) by nebulization 2 (two) times daily.  . Cholecalciferol (VITAMIN D-3) 5000 units TABS Take 1 tablet by mouth daily.  Marland Kitchen co-enzyme Q-10 30 MG capsule Take by mouth daily.    . famotidine (PEPCID) 20 MG tablet One at bedtime  . levothyroxine (SYNTHROID, LEVOTHROID) 50 MCG tablet Take by mouth daily.    . Melatonin 10 MG TABS Take 1 tablet by mouth at bedtime.  . OXYGEN 2 lpm with sleep and as needed during the day  . pantoprazole  (PROTONIX) 40 MG tablet Take 1 tablet (40 mg total) by mouth daily. Take 30-60 min before first meal of the day  . polyethylene glycol (MIRALAX / GLYCOLAX) packet Take 17 g by mouth daily.  . Prenatal Vit-Fe Sulfate-FA (PRENATAL MULTIVIT-IRON PO) Take 1 tablet by mouth daily.  . Probiotic CAPS Take 1 capsule by mouth daily.  Marland Kitchen Respiratory Therapy Supplies (FLUTTER) DEVI Use as directed  . verapamil (CALAN-SR) 120 MG CR tablet Take 120 mg by mouth at bedtime.  Past Medical History:  Bronchiectasis  - CT 11/07/08: Bronchiectasis in the lingular > RML plus small HH  - Sinus CT neg 04/17/09  - HFA 25 > 75% effective April 15, 2009  -PFT's 09/16/2010   FEV1  .89( 55%)  Ratio 49  No better with B2,  DLCO 62 > improved to 107 Pneumonia > cleared radiographically 07/01/2010     - Pneumovax March 2011  Allergic Rhinitis  Pulmonary hemorrhage 1979   Osteoarthritis      - s/p  L TKR 02/2000 >>  Difficult recovery Hypothyroidism  GERD diet                Objective:   Physical Exam  amb wf / 2 wheeled walker  Wt 125 08/03/2010  > 122 09/16/2010  > 128 12/09/2010 > 03/15/2011  133> 1/61096  129> 11/08/2012 131 >  10/22/2014   129 > 02/10/2015  113 > 07/28/2015 107 > 02/29/2016  113 > 07/02/2016 112 > 10/05/2016  107 > 04/06/2017   118 > 07/12/2017   117 > 10/18/2017  113 > 12/05/2017 113     Vital signs reviewed - Note on arrival 02 sats  94% on RA           HEENT: full dentures, nl  turbinates bilaterally, and oropharynx. Nl external ear canals without cough reflex   NECK :  without JVD/Nodes/TM/ nl carotid upstrokes bilaterally   LUNGS: no acc muscle use, Mod kyphotic contour chest with scattered insp/exp rhonchi bilaterally without cough on insp or exp maneuvers   CV:  RRR  no s3  With II-III/ VI SEM but  No  increase in P2, and no edema   ABD:  soft and nontender with nl inspiratory excursion in the supine position. No bruits or organomegaly appreciated, bowel sounds  nl  MS:  Nl gait/ ext warm without deformities, calf tenderness, cyanosis or clubbing No obvious joint restrictions   SKIN: warm and dry without lesions    NEURO:  alert, approp, nl sensorium with  no motor or cerebellar deficits apparent.        Assessment & Plan:

## 2017-12-05 NOTE — Patient Instructions (Signed)
No change in medications or recommendation    Please schedule a follow up visit in 6  months but call sooner if needed

## 2017-12-07 ENCOUNTER — Encounter: Payer: Self-pay | Admitting: Internal Medicine

## 2017-12-07 NOTE — Assessment & Plan Note (Addendum)
- CT 11/07/08: Bronchiectasis in the lingula  > rml plus small HH  - Sinus CT neg 04/17/09   -PFT's 09/16/2010   FEV1  .89( 55%)  Ratio 49  No better with B2,  DLCO 62 > improved to 107 - PFTs  08/10/2012    FEV1  0.78 (50%) ratio 58,  No better p B2  DLCO 55 improved to 86% - Alpha one AT genotype 07/07/10: MM CT chest>   03/16/13 bronchiectasis with nodules - Spirometry 02/10/2015  FEV1  0.54 (33%) ratio 53   - PFT's  07/28/2015  FEV1 0.82 (56 % ) ratio 57  p 3 % improvement from saba p laba/ics neb  prior to study with DLCO  48/53 % corrects to 83 % for alv volume   - 07/02/16 rec doxy prn flares  - PFT's  07/12/2017  FEV1 0.65 (48 % ) ratio 59  p no % improvement from saba p nothing prior to study with DLCO  45 % corrects to 75 % for alv volume   - 07/12/17 Changed prn abx to cipro 750 bid x 10 days as doxy no longer effective  - 10/18/2017  After extensive coaching inhaler device,  effectiveness =    90% from a baseline of 75%  - CT 11/07/08: Bronchiectasis in the lingula  > rml plus small HH  - Sinus CT neg 04/17/09   -PFT's 09/16/2010   FEV1  .89( 55%)  Ratio 49  No better with B2,  DLCO 62 > improved to 107 - PFTs  08/10/2012    FEV1  0.78 (50%) ratio 58,  No better p B2  DLCO 55 improved to 86% - Alpha one AT genotype 07/07/10: MM CT chest>   03/16/13 bronchiectasis with nodules - Spirometry 02/10/2015  FEV1  0.54 (33%) ratio 53   - PFT's  07/28/2015  FEV1 0.82 (56 % ) ratio 57  p 3 % improvement from saba p laba/ics neb  prior to study with DLCO  48/53 % corrects to 83 % for alv volume   - 07/02/16 rec doxy prn flares  - PFT's  07/12/2017  FEV1 0.65 (48 % ) ratio 59  p no % improvement from saba p nothing prior to study with DLCO  45 % corrects to 75 % for alv volume   - 07/12/17 Changed prn abx to cipro 750 bid x 10 days as doxy no longer effective  - 10/18/2017  After extensive coaching inhaler device,  effectiveness =    90% from a baseline of 75%    - The proper method of use, as well as anticipated  side effects, of a flutter valve were discussed and demonstrated to the patient.      Doing much better in terms of self management at this point.  I told her that the choice of starting antibiotics is really a judgment call and that she was in better position to do so than I was.  If the mucus is only discolored in the morning I would ask probably just a result of poor mucociliary function while sleeping and in the absence of worsening vol/ purulence/blood  or dyspnea or systemic symptoms of infection I would hold off the antibiotics as long as possible.  Discussed in detail all the  indications, usual  risks and alternatives  relative to the benefits with patient who agrees to proceed with conservative f/u as outlined     I had an extended discussion with the patient reviewing all  relevant studies completed to date and  lasting 15 to 20 minutes of a 25 minute visit    Each maintenance medication was reviewed in detail including most importantly the difference between maintenance and prns and under what circumstances the prns are to be triggered using an action plan format that is not reflected in the computer generated alphabetically organized AVS.    See device teaching which extended face to face time for this visit (flutter valve)    Please see AVS for specific instructions unique to this visit that I personally wrote and verbalized to the the pt in detail and then reviewed with pt  by my nurse highlighting any  changes in therapy recommended at today's visit to their plan of care.

## 2018-01-12 ENCOUNTER — Telehealth: Payer: Self-pay | Admitting: Internal Medicine

## 2018-01-12 NOTE — Telephone Encounter (Signed)
Patient returned phone call; declined coming in for an appointment; states she have problems with transportation; patient states she is doing better today, but she still wants to know if she should do another round of Cipro and if MW can call in a refill; pt contact # [phone].

## 2018-01-12 NOTE — Telephone Encounter (Signed)
Called spoke with patient who reported that she began with prod cough approx 10 days ago, which began producing dark yellow- to almost brown mucus on the 3rd day of symptoms and she began Cipro Rx BID x10days.  She finished this yesterday.  Patient stated her mucus is a lighter yellow today but feels that she will need an additional 3-4 days of Cipro to help the symptoms completely resolve.  Patient stated this "happens to her every time where she needs a little bit more."  Patient stated that she did refill the Cipro (original Rx had 11 refills 06/2017) but she wanted to call the office prior to taking any additional Rx.  Patient denies any wheezing, increased dyspnea, chest tightness, f/c/s, hemoptysis, chest pain.   Patient is unable to come in for office visit d/t transportation issues.  Dr Sherene SiresWert please advise if patient may take additional days of Cipro 750mg  BID.  Thank you.

## 2018-01-12 NOTE — Telephone Encounter (Signed)
Fine with me to take additonal cipro x 4 days

## 2018-01-12 NOTE — Telephone Encounter (Signed)
Attempted to call pt x2 times but each time I tried to call, someone answered the phone and then immediately hung up. Will try to call back later.  Due to pt already taking 1 round of abx, since she is still having symptoms we need her to come in for an OV. If pt does call office back, please schedule her an appt either with an APP or MW. Thanks!

## 2018-01-12 NOTE — Telephone Encounter (Signed)
Attempted to call pt again but unable to reach pt. Left message for pt to return call.  When pt calls back, please schedule pt an OV. Thanks!

## 2018-01-12 NOTE — Telephone Encounter (Signed)
Pt is aware of MW's recommendations and voiced her understanding. Nothing further is needed.

## 2018-03-03 ENCOUNTER — Ambulatory Visit (INDEPENDENT_AMBULATORY_CARE_PROVIDER_SITE_OTHER)
Admission: RE | Admit: 2018-03-03 | Discharge: 2018-03-03 | Disposition: A | Discharge status: Home / Self Care | Payer: Medicare Other | Source: Ambulatory Visit | Attending: Internal Medicine | Admitting: Internal Medicine

## 2018-03-03 ENCOUNTER — Ambulatory Visit (INDEPENDENT_AMBULATORY_CARE_PROVIDER_SITE_OTHER): Payer: Medicare Other | Admitting: Internal Medicine

## 2018-03-03 ENCOUNTER — Encounter: Payer: Self-pay | Admitting: Internal Medicine

## 2018-03-03 VITALS — BP 120/56 | HR 71 | Ht 61.0 in | Wt 115.4 lb

## 2018-03-03 DIAGNOSIS — J9611 Chronic respiratory failure with hypoxia: Secondary | ICD-10-CM | POA: Diagnosis not present

## 2018-03-03 DIAGNOSIS — J479 Bronchiectasis, uncomplicated: Secondary | ICD-10-CM | POA: Diagnosis not present

## 2018-03-03 MED ORDER — AZITHROMYCIN 250 MG PO TABS: ORAL_TABLET | ORAL | 0 refills | Status: DC

## 2018-03-03 MED ORDER — PREDNISONE 10 MG PO TABS: ORAL_TABLET | ORAL | 0 refills | Status: DC

## 2018-03-03 NOTE — Patient Instructions (Addendum)
Plan A = Automatic =  Brovana / Budesonide 1st thing in am and 12 hours later   Plan B = Backup for breathing/ congestion Only use your albuterol as a rescue medication to be used if you can't catch your breath by resting or doing a relaxed purse lip breathing pattern.  - The less you use it, the better it will work when you need it. - Ok to use the inhaler up to 2 puffs  every 4 hours if you must but call for appointment if use goes up over your usual need - Don't leave home without it !!  (think of it like the spare tire for your car)   Work on inhaler technique:  relax and gently blow all the way out then take a nice smooth deep breath back in, triggering the inhaler at same time you start breathing in.  Hold for up to 5 seconds if you can. Rinse and gargle with water when done       Plan C = Crisis - only use your albuterol nebulizer if you first try Plan B and it fails to help > ok to use the nebulizer up to every 4 hours but if start needing it regularly call for immediate appointment    For cough / congestion > mucinex dm up to 1200 mg every 12 hours and use the flutter valve as much as possible   Prednisone 10 mg take  4 each am x 2 days,   2 each am x 2 days,  1 each am x 2 days and stop   zpak    Please remember to go to the lab and x-ray department   for your tests - we will call you with the results when they are available.         See Tammy NP in 4  weeks with all your medications, even over the counter meds, separated in two separate bags, the ones you take no matter what vs the ones you stop once you feel better and take only as needed when you feel you need them.   Tammy  will generate for you a new user friendly medication calendar that will put [place] all on the same page re: your medication use.     Without this process, it simply isn't possible to assure that we are providing  your outpatient care  with  the attention to detail we feel you deserve.   If we cannot assure  that you're getting that kind of care,  then we cannot manage your problem effectively from this clinic.  Once you have seen Tammy and we are sure that we're all on the same page with your medication use she will arrange follow up with me.

## 2018-03-03 NOTE — Progress Notes (Signed)
Left detailed msg on machine with results

## 2018-03-03 NOTE — Progress Notes (Signed)
Subjective:    Patient ID: Lacey Stanton, female    DOB: 1926/10/03   MRN: [id number]   Brief patient profile:  61  yowf never smoker with obstructive  bronchiectasis with resp symptoms dating back to  the 1980's of cough/sob    History of Present Illness  06/29/2012 f/u ov/Lacey Stanton re bronchiectasis on 02 just at hs and bud/brovana bid maint rx Chief Complaint  Patient presents with  . Follow-up    Pt c/o increased SOB for the past month. She feels that nebs are not lasting long enough any longer.   at baseline maintaining brovana budesonide and rarely needing saba hfa Then one month pta fever no cough but sob and increased proaire and used 02  > ov with Dr Selena Batten rx Avelox and fever resolved but not breathing to her satisfaction and using proaire 2 x daily but no neb saba. Doe x > slow adls rec increase zantac (ranitidine) to 150 mg after bfast and after supper  GERD  diet Continue to use the nebulizer brovana and budesonide and then 12 hours later. Prednisone 10 mg take  4 each am x 2 days,   2 each am x 2 days,  1 each am x2days and stop  Work on inhaler technique    Only use your albuterol (proaire) as rescue  02/06/2013 f/u ov/Lacey Stanton re: bronchiectasis on maint brovana/ bud plus prn saba hfa Chief Complaint  Patient presents with  . Follow-up    Pt states doing well and denies any new co's today.   typically more congested after lunch and feels her self wheezing so uses albuterol but not really helping much and  not using mucinex much  At all nor the flutter. Not limited by breathing though from desired activities rec Pantoprazole (protonix) 40 mg   Take 30-60 min before first meal of the day and Zantac 150  mg one @ bedtime until return to office GERD diet For cough > flutter valve and mucinex or mucinex dm up 1200 mg every 12hours  Relax and do purse lip breath breathing as much as possible.     10/18/2017  f/u ov/Lacey Stanton re: bronchiectasis Chief Complaint  Patient presents with   . Follow-up    Breathing is "okay". She states still coughing up yellow sputum.  She is using her albuterol inhaler 2 x per wk.   Dyspnea:  MMRC2 = can't walk a nl pace on a flat grade s sob but does fine slow and flat / slowed by coughing Cough: minimal yellow esp am  Sleeping: ok on back 2 pillows / bed flat  SABA use: twice a week 02 2lpm hs / rarely uses during the day  rec Plan A = Automatic =  Brovana / Budesonide twice daily  Plan B = Backup for breathing/ congestion Only use your albuterol as a rescue medication Plan C = Crisis - only use your albuterol nebulizer if you first try Plan B and it fails to help > ok to use the nebulizer up to every 4 hours but if start needing it regularly call for immediate appointment For cough / congestion > mucinex dm up to 1200 mg every 12 hours and use the flutter valve as much as possible  Prednisone 10 mg take  4 each am x 2 days,   2 each am x 2 days,  1 each am x 2 days and stop       03/03/2018  f/u ov/Lacey Stanton re: obstructive bronchiectasis  Chief  Complaint  Patient presents with  . Follow-up    Pepcid not working, still having colored mucus, oxygen levels dropping even at rest, on oxygen at night, SOB  Dyspnea: 2 wheeled walker / mailbox 50 ft flat is her limit = MMRC3 = can't walk 100 yards even at a slow pace at a flat grade s stopping due to sob   Cough: about the same/ cipro really didn't help but can't tol avelox and pcn allergic per records/ not using flutter or mucinex as rec  Sleeping: bed flat/ 3 pillows SABA use: 3-4 x per week 02: 2lpm hs    No obvious day to day or daytime variability or assoc  mucus plugs or hemoptysis or cp or chest tightness, subjective wheeze or overt sinus or hb symptoms.   Sleep  without nocturnal  exacerbation  of respiratory  c/o's or need for noct saba. Also denies any obvious fluctuation of symptoms with weather or environmental changes or other aggravating or alleviating factors except as outlined  above   No unusual exposure hx or h/o childhood pna/ asthma or knowledge of premature birth.  Current Allergies, Complete Past Medical History, Past Surgical History, Family History, and Social History were reviewed in [facility] electronic medical record.  ROS  The following are not active complaints unless bolded Hoarseness, sore throat, dysphagia, dental problems, itching, sneezing,  nasal congestion or discharge of excess mucus or purulent secretions, ear ache,   fever, chills, sweats, unintended wt loss or wt gain, classically pleuritic or exertional cp,  orthopnea pnd or arm/hand swelling  or leg swelling, presyncope, palpitations, abdominal pain, anorexia, nausea, vomiting, diarrhea  or change in bowel habits or change in bladder habits, change in stools or change in urine, dysuria, hematuria,  rash, arthralgias, visual complaints, headache, numbness, weakness or ataxia or problems with walking or coordination,  change in mood or  memory.        Current Meds  Medication Sig  . albuterol (PROAIR HFA) 108 (90 Base) MCG/ACT inhaler Inhale 2 puffs into the lungs every 4 (four) hours as needed for wheezing.  Marland Kitchen. albuterol (PROVENTIL) (2.5 MG/3ML) 0.083% nebulizer solution Take 3 mLs (2.5 mg total) by nebulization every 4 (four) hours as needed for wheezing or shortness of breath.  Marland Kitchen. arformoterol (BROVANA) 15 MCG/2ML NEBU Take 2 mLs (15 mcg total) by nebulization 2 (two) times daily.  Marland Kitchen. b complex vitamins tablet Take 1 tablet by mouth daily.  Marland Kitchen. BIOTIN PO Take 1,000 mg by mouth daily.  . budesonide (PULMICORT) 0.5 MG/2ML nebulizer solution Take 2 mLs (0.5 mg total) by nebulization 2 (two) times daily.  . Cholecalciferol (VITAMIN D-3) 5000 units TABS Take 1 tablet by mouth daily.  Marland Kitchen. co-enzyme Q-10 30 MG capsule Take by mouth daily.    . famotidine (PEPCID) 20 MG tablet One at bedtime  . levothyroxine (SYNTHROID, LEVOTHROID) 50 MCG tablet Take by mouth daily.    . Melatonin 10 MG TABS Take 1  tablet by mouth at bedtime.  . OXYGEN 2 lpm with sleep and as needed during the day  . pantoprazole (PROTONIX) 40 MG tablet Take 1 tablet (40 mg total) by mouth daily. Take 30-60 min before first meal of the day  . polyethylene glycol (MIRALAX / GLYCOLAX) packet Take 17 g by mouth daily.  . Prenatal Vit-Fe Sulfate-FA (PRENATAL MULTIVIT-IRON PO) Take 1 tablet by mouth daily.  . Probiotic CAPS Take 1 capsule by mouth daily.  Marland Kitchen. Respiratory Therapy Supplies (FLUTTER) DEVI Use as  directed  . verapamil (CALAN-SR) 120 MG CR tablet Take 120 mg by mouth at bedtime.               Past Medical History:  Bronchiectasis  - CT 11/07/08: Bronchiectasis in the lingular > RML plus small HH  - Sinus CT neg 04/17/09  - HFA 25 > 75% effective April 15, 2009  -PFT's 09/16/2010   FEV1  .89( 55%)  Ratio 49  No better with B2,  DLCO 62 > improved to 107 Pneumonia > cleared radiographically 07/01/2010     - Pneumovax March 2011  Allergic Rhinitis  Pulmonary hemorrhage 1979   Osteoarthritis      - s/p  L TKR 02/2000 >>  Difficult recovery Hypothyroidism  GERD diet       Objective:   Physical Exam  amb wf rattling cough   Wt 125 08/03/2010  > 122 09/16/2010  > 128 12/09/2010 > 03/15/2011  133> [id number]  129> 11/08/2012 131 >  10/22/2014   129 > 02/10/2015  113 > 07/28/2015 107 > 02/29/2016  113 > 07/02/2016 112 > 10/05/2016  107 > 04/06/2017   118 > 07/12/2017   117 > 10/18/2017  113 > 12/05/2017 113 > 03/03/2018   115    Vital signs reviewed - Note on arrival 02 sats  91% on RA        HEENT: full dentures/ nl  turbinates bilaterally, and oropharynx. Nl external ear canals without cough reflex   NECK :  without JVD/Nodes/TM/ nl carotid upstrokes bilaterally   LUNGS: no acc muscle use, Kyphotic  contour chest  With insp /exp rhonchi bilaterally without cough on insp or exp maneuvers   CV:  RRR  no s3 or murmur or increase in P2, and no edema   ABD:  soft and nontender with nl inspiratory excursion in the supine  position. No bruits or organomegaly appreciated, bowel sounds nl  MS:  Nl gait/ ext warm without deformities, calf tenderness, cyanosis or clubbing No obvious joint restrictions   SKIN: warm and dry without lesions    NEURO:  alert, approp, nl sensorium with  no motor or cerebellar deficits apparent.     CXR PA and Lateral:   03/03/2018 :    I personally reviewed images and agree with radiology impression as follows:   No acute finding. Stable changes of COPD and lingular bronchiectasis.  Assessment & Plan:

## 2018-03-04 ENCOUNTER — Encounter: Payer: Self-pay | Admitting: Internal Medicine

## 2018-03-04 NOTE — Assessment & Plan Note (Signed)
Onset of symptoms 1980s - CT 11/07/08: Bronchiectasis in the lingula  > rml plus small HH  - Sinus CT neg 04/17/09   -PFT's 09/16/2010   FEV1  .89( 55%)  Ratio 49  No better with B2,  DLCO 62 > improved to 107 - PFTs  08/10/2012    FEV1  0.78 (50%) ratio 58,  No better p B2  DLCO 55 improved to 86% - Alpha one AT phenotype 07/07/10: MM CT chest>   03/16/13 bronchiectasis with nodules - Spirometry 02/10/2015  FEV1  0.54 (33%) ratio 53   - PFT's  07/28/2015  FEV1 0.82 (56 % ) ratio 57  p 3 % improvement from saba p laba/ics neb  prior to study with DLCO  48/53 % corrects to 83 % for alv volume   - 07/02/16 rec doxy prn flares  - PFT's  07/12/2017  FEV1 0.65 (48 % ) ratio 59  p no % improvement from saba p nothing prior to study with DLCO  45 % corrects to 75 % for alv volume   - 07/12/17 Changed prn abx to cipro 750 bid x 10 days as doxy no longer effective  - 03/03/2018  After extensive coaching inhaler device,  effectiveness =    75%  (short Ti)    Very limited in terms of options for abx in setting of refractory bronchiectasis given multiple drug intol therefore rec  1) optimize rx with bronchodilators/ reviewed with pt in ABC format 2) zpak pending sputum culture 3) send sputum culture today  4) Prednisone 10 mg take  4 each am x 2 days,   2 each am x 2 days,  1 each am x 2 days and stop

## 2018-03-04 NOTE — Assessment & Plan Note (Signed)
03/28/13  Patient Saturations on Room Air at Rest = 93% Patient Saturations on [employer] while Ambulating = 88% Patient Saturations on 2 Liters of oxygen while Ambulating = 97% - 10/22/2014  Walked RA x 2 laps @ 185 ft each stopped due sob at slow pace but sats still 90%   - 07/28/2015  Walked RA x 3 laps @ 185 ft each stopped due to  End of study, nl pace, no sob or desat   As of 03/03/2018   2lpm hs and adjust daytime to sats > 90%    I had an extended discussion with the patient reviewing all relevant studies completed to date and  lasting 15 to 20 minutes of a 25 minute visit    See device teaching which extended face to face time for this visit.  Each maintenance medication was reviewed in detail including emphasizing most importantly the difference between maintenance and prns and under what circumstances the prns are to be triggered using an action plan format that is not reflected in the computer generated alphabetically organized AVS which I have not found useful in most complex patients, especially with respiratory illnesses  Please see AVS for specific instructions unique to this visit that I personally wrote and verbalized to the the pt in detail and then reviewed with pt  by my nurse highlighting any  changes in therapy recommended at today's visit to their plan of care.

## 2018-03-28 DIAGNOSIS — R54 Age-related physical debility: Secondary | ICD-10-CM | POA: Insufficient documentation

## 2018-03-31 ENCOUNTER — Encounter: Payer: Self-pay | Admitting: Adult Health

## 2018-03-31 ENCOUNTER — Ambulatory Visit (INDEPENDENT_AMBULATORY_CARE_PROVIDER_SITE_OTHER): Payer: Medicare Other | Admitting: Adult Health

## 2018-03-31 VITALS — BP 116/56 | HR 67 | Ht 61.0 in | Wt 117.0 lb

## 2018-03-31 DIAGNOSIS — J9611 Chronic respiratory failure with hypoxia: Secondary | ICD-10-CM | POA: Diagnosis not present

## 2018-03-31 DIAGNOSIS — J479 Bronchiectasis, uncomplicated: Secondary | ICD-10-CM

## 2018-03-31 NOTE — Assessment & Plan Note (Signed)
Recent flare now improved  Will follow sputum cx and sputum afb results  Patient's medications were reviewed today and patient education was given. Computerized medication calendar was adjusted/completed   Plan  Patient Instructions  Follow medication calendar closely and bring to each visit.  Continue on current regimen  Sputum culture is pending  Follow up with Dr. Sherene Sires  In 4 months and As needed

## 2018-03-31 NOTE — Assessment & Plan Note (Signed)
Cont on O2 At bedtime   

## 2018-03-31 NOTE — Patient Instructions (Signed)
Follow medication calendar closely and bring to each visit.  Continue on current regimen  Sputum culture is pending  Follow up with Dr. Sherene Sires  In 4 months and As needed

## 2018-03-31 NOTE — Progress Notes (Signed)
@Patient  ID: Lacey Stanton, female    DOB: February 13, 1926, 85 y.o.   MRN: [id number]  Chief Complaint  Patient presents with  . Follow-up    Bronchiectasis    Referring provider: No ref. provider found  HPI: 85 year-old female never smoker followed for obstructive bronchiectasis  TEST  CT 11/07/08: Bronchiectasis in the lingula >rml plus small HH  - Sinus CT neg 04/17/09  -PFT's 09/16/2010 FEV1 .89( 55%) Ratio 49 No better with B2, DLCO 62 >improved to 107 - PFTs 7/17/2014FEV1 0.78 (50%) ratio 58, No better p B2 DLCO 55 improved to 86% - Alpha one AT genotype 07/07/10: MM CT chest>03/16/13 bronchiectasis with nodules - Spirometry 1/16/2017FEV1 0.54 (33%) ratio 53  - PFT's 7/3/2017FEV1 0.82 (56 % ) ratio 57 p 3 % improvement from saba p laba/ics neb prior to study with DLCO 48/53 % corrects to 83 % for alv volume  - PFT's 6/18/2019FEV1 0.65 (48 % ) ratio 59 p no % improvement from saba p nothing prior to study with DLCO 45 % corrects to 75 % for alv volume    03/31/2018 Follow up : Bronchiectasis , O2 RF  Patient presents for a one-month follow-up.  Patient was seen last visit with a bronchiectatic flare.  She was treated with a Z-Pak and a prednisone taper.  Since last visit she is feeling better with less cough and congestion .  She was told to bring in sputum culture today , she brought this , hard to get up much mucus despite using flutter valve. Feels she is doing much better, back to her baseline  Tries to be active . Uses walker.  No hemoptysis or fever.  She remains on Brovana and Budesonide Nebs Twice daily  . Minimal albuterol use.   We reviewed all her medicines and put them into a medication calendar with patient education. She appears to be taking her medications correctly.    Allergies  Allergen Reactions  . Amoxicillin     REACTION: hives  . Avelox [Moxifloxacin Hcl In Nacl] Itching  . Iron     Per pt can only tolerate iron  injection  . Levaquin [Levofloxacin In D5w] Nausea Only  . Statins Other (See Comments)    Joint pain  . Sulfa Antibiotics     Unknown reaction  . Symbicort [Budesonide-Formoterol Fumarate]     Pt reports she cannot take this d/t having glaucoma.  Has tried this and it affected eyes (eye pain).    Immunization History  Administered Date(s) Administered  . Influenza Split 11/07/2012, 11/08/2013, 10/01/2014  . Influenza Whole 10/25/2009, 10/26/2011, 10/26/2015  . Influenza, High Dose Seasonal PF 10/07/2014, 10/13/2015, 11/12/2016, 10/21/2017  . Influenza-Unspecified 11/07/2012, 11/08/2013, 10/01/2014  . Pneumococcal Conjugate-13 08/07/2013, 10/08/2013  . Pneumococcal Polysaccharide-23 04/07/2009  . Tdap 07/08/2010  . Zoster 09/19/2007, 05/10/2011    Past Medical History:  Diagnosis Date  . Allergic rhinitis   . Bronchiectasis    CT 11/07/08: bronchiectasis in the lingular > rml plus small HH.  Sinus CT ne 04/17/09.  HFA 25>75% effective April 15, 2009  . GERD (gastroesophageal reflux disease)   . Hypothyroidism   . Osteoarthritis   . Pneumonia   . Pulmonary hemorrhage 1979    Tobacco History: Social History   Tobacco Use  Smoking Status Never Smoker  Smokeless Tobacco Never Used   Counseling given: Not Answered   Outpatient Medications Prior to Visit  Medication Sig Dispense Refill  . albuterol (PROAIR HFA) 108 (90 Base) MCG/ACT  inhaler Inhale 2 puffs into the lungs every 4 (four) hours as needed for wheezing. 1 Inhaler 1  . albuterol (PROVENTIL) (2.5 MG/3ML) 0.083% nebulizer solution Take 3 mLs (2.5 mg total) by nebulization every 4 (four) hours as needed for wheezing or shortness of breath. 75 mL 12  . arformoterol (BROVANA) 15 MCG/2ML NEBU Take 2 mLs (15 mcg total) by nebulization 2 (two) times daily. 120 mL 5  . b complex vitamins tablet Take 1 tablet by mouth daily.    Marland Kitchen BIOTIN PO Take 1,000 mg by mouth daily.    . budesonide (PULMICORT) 0.5 MG/2ML nebulizer  solution Take 2 mLs (0.5 mg total) by nebulization 2 (two) times daily. 120 mL 5  . Cholecalciferol (VITAMIN D-3) 5000 units TABS Take 1 tablet by mouth daily.    Marland Kitchen co-enzyme Q-10 30 MG capsule Take by mouth daily.      . famotidine (PEPCID) 20 MG tablet One at bedtime 30 tablet 11  . levothyroxine (SYNTHROID, LEVOTHROID) 50 MCG tablet Take by mouth daily.      . Melatonin 10 MG TABS Take 1 tablet by mouth at bedtime.    . OXYGEN 2 lpm with sleep and as needed during the day    . pantoprazole (PROTONIX) 40 MG tablet Take 1 tablet (40 mg total) by mouth daily. Take 30-60 min before first meal of the day 30 tablet 5  . polyethylene glycol (MIRALAX / GLYCOLAX) packet Take 17 g by mouth daily.    . Prenatal Vit-Fe Sulfate-FA (PRENATAL MULTIVIT-IRON PO) Take 1 tablet by mouth daily.    . Probiotic CAPS Take 1 capsule by mouth daily.    Marland Kitchen Respiratory Therapy Supplies (FLUTTER) DEVI Use as directed 1 each 0  . verapamil (CALAN-SR) 120 MG CR tablet Take 120 mg by mouth at bedtime.  3  . azithromycin (ZITHROMAX) 250 MG tablet Take 2 on day one then 1 daily x 4 days (Patient not taking: Reported on 03/31/2018) 6 tablet 0  . predniSONE (DELTASONE) 10 MG tablet Take  4 each am x 2 days,   2 each am x 2 days,  1 each am x 2 days and stop (Patient not taking: Reported on 03/31/2018) 14 tablet 0   No facility-administered medications prior to visit.      Review of Systems:   Constitutional:   No  weight loss, night sweats,  Fevers, chills,  +fatigue, or  lassitude.  HEENT:   No headaches,  Difficulty swallowing,  Tooth/dental problems, or  Sore throat,                No sneezing, itching, ear ache,  +nasal congestion, post nasal drip,   CV:  No chest pain,  Orthopnea, PND, swelling in lower extremities, anasarca, dizziness, palpitations, syncope.   GI  No heartburn, indigestion, abdominal pain, nausea, vomiting, diarrhea, change in bowel habits, loss of appetite, bloody stools.   Resp:   No chest wall  deformity  Skin: no rash or lesions.  GU: no dysuria, change in color of urine, no urgency or frequency.  No flank pain, no hematuria   MS:  No joint pain or swelling.  No decreased range of motion.  No back pain.    Physical Exam  BP (!) 116/56 (BP Location: Left Arm, Cuff Size: Normal)   Pulse 67   Ht 5\' 1"  (1.549 m)   Wt 117 lb (53.1 kg)   SpO2 94%   BMI 22.11 kg/m   GEN: A/Ox3; pleasant ,  NAD, thin and elderly    HEENT:  [place]/AT,  EACs-clear, TMs-wnl, NOSE-clear, THROAT-clear, no lesions, no postnasal drip or exudate noted.   NECK:  Supple w/ fair ROM; no JVD; normal carotid impulses w/o bruits; no thyromegaly or nodules palpated; no lymphadenopathy.    RESP  Few trace rhonchi   no accessory muscle use, no dullness to percussion  CARD:  RRR, no m/r/g, no peripheral edema, pulses intact, no cyanosis or clubbing.  GI:   Soft & nt; nml bowel sounds; no organomegaly or masses detected.   Musco: Warm bil, no deformities or joint swelling noted.   Neuro: alert, no focal deficits noted.    Skin: Warm, no lesions or rashes    Lab Results:  CBC  BMET   BNP No results found for: BNP  ProBNP    Component Value Date/Time   PROBNP 52.0 07/12/2017 1042    Imaging: Dg Chest 2 View  Result Date: 03/03/2018 CLINICAL DATA:  Increased shortness of breath for the past 1.5-2 weeks. History of bronchiectasis. EXAM: CHEST - 2 VIEW COMPARISON:  10/18/2017. FINDINGS: The heart remains normal in size. The lungs remain hyperexpanded with coarse eccentric to a shin of the interstitial markings and lingular bronchiectasis. Diffuse osteopenia. Stable thoracolumbar spine compression deformities and anterior spur formation. IMPRESSION: No acute finding. Stable changes of COPD and lingular bronchiectasis. Electronically Signed   By: Beckie Salts M.D.   On: 03/03/2018 15:14      PFT Results Latest Ref Rng & Units 07/12/2017 07/28/2015 07/31/2012  FVC-Pre L 1.11 1.36 -  FVC-Predicted Pre %  58 69 64  FVC-Post L 1.11 1.43 1.39  FVC-Predicted Post % 58 72 65  Pre FEV1/FVC % % 59 58 58  Post FEV1/FCV % % 58 57 60  FEV1-Pre L 0.65 0.79 0.78  FEV1-Predicted Pre % 48 54 50  FEV1-Post L 0.65 0.82 0.83  DLCO UNC% % 45 48 55  DLCO COR %Predicted % 75 83 86  TLC L 5.19 4.85 3.82  TLC % Predicted % 110 103 81  RV % Predicted % 155 146 120    No results found for: NITRICOXIDE      Assessment & Plan:   Obstructive bronchiectasis (HCC) Recent flare now improved  Will follow sputum cx and sputum afb results  Patient's medications were reviewed today and patient education was given. Computerized medication calendar was adjusted/completed   Plan  Patient Instructions  Follow medication calendar closely and bring to each visit.  Continue on current regimen  Sputum culture is pending  Follow up with Dr. Sherene Sires  In 4 months and As needed        Chronic respiratory failure with hypoxia (HCC) Cont on O2 At bedtime       Rubye Oaks, NP 03/31/2018

## 2018-04-07 ENCOUNTER — Other Ambulatory Visit: Payer: Self-pay | Admitting: Adult Health

## 2018-04-07 MED ORDER — CEFDINIR 300 MG PO CAPS: 300.0000 mg | ORAL_CAPSULE | Freq: Two times a day (BID) | ORAL | 0 refills | Status: DC

## 2018-04-13 NOTE — Progress Notes (Signed)
Chart and office note reviewed in detail  > agree with a/p as outlined    

## 2018-04-14 ENCOUNTER — Other Ambulatory Visit: Payer: Self-pay | Admitting: Internal Medicine

## 2018-05-08 ENCOUNTER — Telehealth: Payer: Self-pay | Admitting: Internal Medicine

## 2018-05-08 NOTE — Telephone Encounter (Signed)
Spoke with Elease Hashimoto and she stated they spoke with [facility] and they did have the Rx for Pulmicort and they already have it and will be sending it to the patient today. Nothing further is needed.

## 2018-05-12 ENCOUNTER — Telehealth: Payer: Self-pay | Admitting: Internal Medicine

## 2018-05-12 ENCOUNTER — Other Ambulatory Visit: Payer: Self-pay | Admitting: Internal Medicine

## 2018-05-12 DIAGNOSIS — R0609 Other forms of dyspnea: Principal | ICD-10-CM

## 2018-05-12 MED ORDER — BUDESONIDE 0.5 MG/2ML IN SUSP: 0.5000 mg | Freq: Two times a day (BID) | RESPIRATORY_TRACT | 5 refills | Status: DC

## 2018-05-12 MED ORDER — PREDNISONE 10 MG PO TABS: ORAL_TABLET | ORAL | 0 refills | Status: DC

## 2018-05-12 NOTE — Telephone Encounter (Signed)
pulmocort is basically prednisone so she can always take an oral dose pack if breathing gets worse before her shipment comes in   Give #30 x 10 mg rx 2 daily until better then 1 daily x 5 days and stop

## 2018-05-12 NOTE — Telephone Encounter (Signed)
ATC patient, unable to reach, LMTCB, order still needs to be placed.

## 2018-05-12 NOTE — Addendum Note (Signed)
Addended by: Boone Master E on: 05/12/2018 03:56 PM   Modules accepted: Orders

## 2018-05-12 NOTE — Telephone Encounter (Signed)
Called and spoke with pt stating to her the info per Dr. Sherene Sires. Pt expressed understanding and stated to send in prednisone Rx to pharmacy for her so she could have. Verified pt's preferred pharmacy and sent Rx in. Nothing further needed.

## 2018-05-12 NOTE — Telephone Encounter (Signed)
Previous phone note (05/08/18) says patient should be getting medication. I also resent Rx to [facility] in [place].   Patient is wondering what she should do since she is out of her pulmicort (nebulizer) and doesn't know when she will get it again.  And wants to know what to do until she gets her pulmicort if it ever comes. Patient is currently taking her brovana. She also has her rescue inhaler (albuterol).    Dr. Sherene Sires, please advise.

## 2018-05-15 ENCOUNTER — Telehealth: Payer: Self-pay

## 2018-05-15 NOTE — Telephone Encounter (Signed)
Returned call to patient. There is not another message other than 4/17. Pt has p/u prednisone. She had been out of town and saw some missed calls. She was just calling back. While on the phone she mentioned she is having a hard time with [facility] pharmacy. Made patient aware that she can switch pharmacies if she chooses. She will hold off for now. Nothing further needed.

## 2018-05-18 LAB — MYCOBACTERIA,CULT W/FLUOROCHROME SMEAR
MICRO NUMBER:: 293867
SMEAR:: NONE SEEN
SPECIMEN QUALITY:: ADEQUATE

## 2018-05-18 LAB — RESPIRATORY CULTURE OR RESPIRATORY AND SPUTUM CULTURE
MICRO NUMBER:: 293868
RESULT: NORMAL
SPECIMEN QUALITY:: ADEQUATE

## 2018-06-05 ENCOUNTER — Ambulatory Visit: Payer: Medicare Other | Admitting: Internal Medicine

## 2018-07-20 ENCOUNTER — Other Ambulatory Visit: Payer: Self-pay | Admitting: Internal Medicine

## 2018-08-09 ENCOUNTER — Other Ambulatory Visit: Payer: Self-pay

## 2018-08-09 ENCOUNTER — Encounter: Payer: Self-pay | Admitting: Internal Medicine

## 2018-08-09 ENCOUNTER — Ambulatory Visit (INDEPENDENT_AMBULATORY_CARE_PROVIDER_SITE_OTHER): Payer: Medicare Other | Admitting: Internal Medicine

## 2018-08-09 DIAGNOSIS — J479 Bronchiectasis, uncomplicated: Secondary | ICD-10-CM | POA: Diagnosis not present

## 2018-08-09 DIAGNOSIS — K219 Gastro-esophageal reflux disease without esophagitis: Secondary | ICD-10-CM | POA: Diagnosis not present

## 2018-08-09 NOTE — Patient Instructions (Addendum)
Try taking pepcid 40 mg at bedtime if having night time symptoms of heart burn or congestion  GERD (REFLUX)  is an extremely common cause of respiratory symptoms just like yours , many times with no obvious heartburn at all.    It can be treated with medication, but also with lifestyle changes including elevation of the head of your bed (ideally with 6 -8inch blocks under the headboard of your bed),  Smoking cessation, avoidance of late meals, excessive alcohol, and avoid fatty foods, chocolate, peppermint, colas, red wine, and acidic juices such as orange juice.  NO MINT OR MENTHOL PRODUCTS SO NO COUGH DROPS  USE SUGARLESS CANDY INSTEAD (Jolley ranchers or Stover's or Life Savers) or even ice chips will also do - the key is to swallow to prevent all throat clearing. NO OIL BASED VITAMINS - use powdered substitutes.  Avoid fish oil when coughing.     See calendar for specific medication instructions and bring it back for each and every office visit for every healthcare provider you see.  Without it,  you may not receive the best quality medical care that we feel you deserve.  You will note that the calendar groups together  your maintenance  medications that are timed at particular times of the day.  Think of this as your checklist for what your doctor has instructed you to do until your next evaluation to see what benefit  there is  to staying on a consistent group of medications intended to keep you well.  The other group at the bottom is entirely up to you to use as you see fit  for specific symptoms that may arise between visits that require you to treat them on an as needed basis.  Think of this as your action plan or "what if" list.   Separating the top medications from the bottom group is fundamental to providing you adequate care going forward.   If doxy not effective at turning the mucus back to white then call us.   Please schedule a follow up visit in 6  months but call sooner if  needed

## 2018-08-09 NOTE — Assessment & Plan Note (Signed)
Onset of symptoms 1980s - CT 11/07/08: Bronchiectasis in the lingula  > rml plus small HH  - Sinus CT neg 04/17/09   -PFT's 09/16/2010   FEV1  .89( 55%)  Ratio 49  No better with B2,  DLCO 62 > improved to 107 - PFTs  08/10/2012    FEV1  0.78 (50%) ratio 58,  No better p B2  DLCO 55 improved to 86% - Alpha one AT phenotype 07/07/10: MM CT chest>   03/16/13 bronchiectasis with nodules - Spirometry 02/10/2015  FEV1  0.54 (33%) ratio 53   - PFT's  07/28/2015  FEV1 0.82 (56 % ) ratio 57  p 3 % improvement from saba p laba/ics neb  prior to study with DLCO  48/53 % corrects to 83 % for alv volume   - 07/02/16 rec doxy prn flares  - PFT's  07/12/2017  FEV1 0.65 (48 % ) ratio 59  p no % improvement from saba p nothing prior to study with DLCO  45 % corrects to 75 % for alv volume   - 07/12/17 Changed prn abx to cipro 750 bid x 10 days as doxy no longer effective  - 03/03/2018  After extensive coaching inhaler device,  effectiveness =    75%  (short Ti)    Adequate control on present rx, reviewed in detail with pt > no change in rx needed    Each maintenance medication was reviewed in detail including most importantly the difference between maintenance and as needed and under what circumstances the prns are to be used.  Please see AVS for specific  Instructions which are unique to this visit and I personally typed out  which were reviewed in detail in writing with the patient and a copy provided.

## 2018-08-09 NOTE — Assessment & Plan Note (Signed)
rec try elevate hob on blocks and increase pepcid to 40 mg at bedtime and f/u gi prn   Each maintenance medication was reviewed in detail including most importantly the difference between maintenance and as needed and under what circumstances the prns are to be used.  Please see AVS for specific  Instructions which are unique to this visit and I personally typed out  which were reviewed in detail in writing with the patient and a copy provided.

## 2018-08-09 NOTE — Progress Notes (Signed)
Subjective:    Patient ID: Lacey Stanton, female    DOB: 1926/10/03   MRN: [id number]   Brief patient profile:  61  yowf never smoker with obstructive  bronchiectasis with resp symptoms dating back to  the 1980's of cough/sob    History of Present Illness  06/29/2012 f/u ov/Lacey Stanton re bronchiectasis on 02 just at hs and bud/brovana bid maint rx Chief Complaint  Patient presents with  . Follow-up    Pt c/o increased SOB for the past month. She feels that nebs are not lasting long enough any longer.   at baseline maintaining brovana budesonide and rarely needing saba hfa Then one month pta fever no cough but sob and increased proaire and used 02  > ov with Dr Selena Batten rx Avelox and fever resolved but not breathing to her satisfaction and using proaire 2 x daily but no neb saba. Doe x > slow adls rec increase zantac (ranitidine) to 150 mg after bfast and after supper  GERD  diet Continue to use the nebulizer brovana and budesonide and then 12 hours later. Prednisone 10 mg take  4 each am x 2 days,   2 each am x 2 days,  1 each am x2days and stop  Work on inhaler technique    Only use your albuterol (proaire) as rescue  02/06/2013 f/u ov/Lacey Stanton re: bronchiectasis on maint brovana/ bud plus prn saba hfa Chief Complaint  Patient presents with  . Follow-up    Pt states doing well and denies any new co's today.   typically more congested after lunch and feels her self wheezing so uses albuterol but not really helping much and  not using mucinex much  At all nor the flutter. Not limited by breathing though from desired activities rec Pantoprazole (protonix) 40 mg   Take 30-60 min before first meal of the day and Zantac 150  mg one @ bedtime until return to office GERD diet For cough > flutter valve and mucinex or mucinex dm up 1200 mg every 12hours  Relax and do purse lip breath breathing as much as possible.     10/18/2017  f/u ov/Lacey Stanton re: bronchiectasis Chief Complaint  Patient presents with   . Follow-up    Breathing is "okay". She states still coughing up yellow sputum.  She is using her albuterol inhaler 2 x per wk.   Dyspnea:  MMRC2 = can't walk a nl pace on a flat grade s sob but does fine slow and flat / slowed by coughing Cough: minimal yellow esp am  Sleeping: ok on back 2 pillows / bed flat  SABA use: twice a week 02 2lpm hs / rarely uses during the day  rec Plan A = Automatic =  Brovana / Budesonide twice daily  Plan B = Backup for breathing/ congestion Only use your albuterol as a rescue medication Plan C = Crisis - only use your albuterol nebulizer if you first try Plan B and it fails to help > ok to use the nebulizer up to every 4 hours but if start needing it regularly call for immediate appointment For cough / congestion > mucinex dm up to 1200 mg every 12 hours and use the flutter valve as much as possible  Prednisone 10 mg take  4 each am x 2 days,   2 each am x 2 days,  1 each am x 2 days and stop       03/03/2018  f/u ov/Lacey Stanton re: obstructive bronchiectasis  Chief  Complaint  Patient presents with  . Follow-up    Pepcid not working, still having colored mucus, oxygen levels dropping even at rest, on oxygen at night, SOB  Dyspnea: 2 wheeled walker / mailbox 50 ft flat is her limit = MMRC3 = can't walk 100 yards even at a slow pace at a flat grade s stopping due to sob   Cough: about the same/ cipro really didn't help but can't tol avelox and pcn allergic per records/ not using flutter or mucinex as rec  Sleeping: bed flat/ 3 pillows SABA use: 3-4 x per week 02: 2lpm hs rec  Plan A = Automatic =  Brovana / Budesonide 1st thing in am and 12 hours later Plan B = Backup for breathing/ congestion Only use your albuterol as a rescue medication Work on inhaler technique:   Plan C = Crisis - only use your albuterol nebulizer if you first try Plan B  For cough / congestion > mucinex dm up to 1200 mg every 12 hours and use the flutter valve as much as possible   Prednisone 10 mg take  4 each am x 2 days,   2 each am x 2 days,  1 each am x 2 days and stop  Zpak   Sputum culture 03/31/18  E coli > rx omnicef x 7 days > much better     08/09/2018  f/u ov/Lacey Stanton re: bronchiectasis on brovana/bud bid  Confused with details of care, did not bring new med calendar but says "best she's been in a while"  Chief Complaint  Patient presents with  . Other    Bronchiectasis - patient has been doing well since last visit.Medications have been working.  Dyspnea:  Walk in hallways at home and mailbox with a cane or walker  Cough: some in am dark but white  Sleeping:  1- 2 pillows but bed is flat  SABA use: not needing  02: 2lpm hs and famotidine but still with breakthru sense of hb/ "Chest congestion " at hs   No obvious day to day or daytime variability or assoc excess/ purulent sputum or mucus plugs or hemoptysis or cp or chest tightness, subjective wheeze or overt sinus symptoms.     Also denies any obvious fluctuation of symptoms with weather or environmental changes or other aggravating or alleviating factors except as outlined above   No unusual exposure hx or h/o childhood pna/ asthma or knowledge of premature birth.  Current Allergies, Complete Past Medical History, Past Surgical History, Family History, and Social History were reviewed in [facility] electronic medical record.  ROS  The following are not active complaints unless bolded Hoarseness, sore throat, dysphagia, dental problems, itching, sneezing,  nasal congestion or discharge of excess mucus or purulent secretions, ear ache,   fever, chills, sweats, unintended wt loss or wt gain, classically pleuritic or exertional cp,  orthopnea pnd or arm/hand swelling  or leg swelling, presyncope, palpitations, abdominal pain, anorexia, nausea, vomiting, diarrhea  or change in bowel habits or change in bladder habits, change in stools or change in urine, dysuria, hematuria,  rash, arthralgias, visual  complaints, headache, numbness, weakness or ataxia or problems with walking uses can or walker or coordination,  change in mood or  memory.        Current Meds  Medication Sig  . acetaminophen (TYLENOL) 325 MG tablet Per bottle as needed  . albuterol (PROAIR HFA) 108 (90 Base) MCG/ACT inhaler Inhale 2 puffs into the lungs every 4 (four)  hours as needed for wheezing.  Marland Kitchen albuterol (PROVENTIL) (2.5 MG/3ML) 0.083% nebulizer solution Take 3 mLs (2.5 mg total) by nebulization every 4 (four) hours as needed for wheezing or shortness of breath.  Marland Kitchen BIOTIN PO Take 1,000 mg by mouth daily.  Marland Kitchen BROVANA 15 MCG/2ML NEBU USE 1 VIAL  IN  NEBULIZER TWICE  DAILY - morning and evening  . budesonide (PULMICORT) 0.5 MG/2ML nebulizer solution USE 1 VIAL  IN  NEBULIZER TWICE  DAILY - rinse mouth after treatment  . Cholecalciferol (VITAMIN D-3) 5000 units TABS Take 1 tablet by mouth daily.  Marland Kitchen dextromethorphan-guaiFENesin (MUCINEX DM) 30-600 MG 12hr tablet Take 1 tablet by mouth 2 (two) times daily as needed for cough.  . famotidine (PEPCID) 20 MG tablet TAKE ONE TABLET BY MOUTH EVERY DAY AT BEDTIME  . levothyroxine (SYNTHROID, LEVOTHROID) 50 MCG tablet Take by mouth daily.    . Magnesium 250 MG TABS Take 1 tablet by mouth daily.  . Melatonin 10 MG TABS Take 1 tablet by mouth at bedtime.  . OXYGEN 2 lpm with sleep and as needed during the day  . pantoprazole (PROTONIX) 40 MG tablet Take 1 tablet (40 mg total) by mouth daily. Take 30-60 min before first meal of the day  . Prenatal Vit-Fe Sulfate-FA (PRENATAL MULTIVIT-IRON PO) Take 1 tablet by mouth daily.  . Probiotic CAPS Take 1 capsule by mouth daily.  Marland Kitchen Respiratory Therapy Supplies (FLUTTER) DEVI Use as directed  . verapamil (CALAN-SR) 120 MG CR tablet Take 120 mg by mouth at bedtime.           Past Medical History:  Bronchiectasis  - CT 11/07/08: Bronchiectasis in the lingular > RML plus small HH  - Sinus CT neg 04/17/09  - HFA 25 > 75% effective April 15, 2009  -PFT's 09/16/2010   FEV1  .89( 55%)  Ratio 49  No better with B2,  DLCO 62 > improved to 107 Pneumonia > cleared radiographically 07/01/2010     - Pneumovax March 2011  Allergic Rhinitis  Pulmonary hemorrhage 1979   Osteoarthritis      - s/p  L TKR 02/2000 >>  Difficult recovery Hypothyroidism  GERD diet       Objective:   Physical Exam   amb wm nad   08/09/2018    115  Wt 125 08/03/2010  > 122 09/16/2010  > 128 12/09/2010 > 03/15/2011  133> [id number]  129> 11/08/2012 131 >  10/22/2014   129 > 02/10/2015  113 > 07/28/2015 107 > 02/29/2016  113 > 07/02/2016 112 > 10/05/2016  107 > 04/06/2017   118 > 07/12/2017   117 > 10/18/2017  113 > 12/05/2017 113 > 03/03/2018   115   Vital signs reviewed - Note on arrival 02 sats  94% on RA        HEENT: Full dentures,  Nl turbinates bilaterally, and oropharynx. Nl external ear canals without cough reflex   NECK :  without JVD/Nodes/TM/ nl carotid upstrokes bilaterally   LUNGS: no acc muscle use, Mod kyphotic chest with minimal insp/exp rhonchi bilaterally without cough on insp or exp maneuvers   CV:  RRR  no s3 or murmur or increase in P2, and no edema   ABD:  soft and nontender with nl inspiratory excursion in the supine position. No bruits or organomegaly appreciated, bowel sounds nl  MS:  Nl gait/ ext warm without deformities, calf tenderness, cyanosis or clubbing No obvious joint restrictions   SKIN: warm and  dry without lesions    NEURO:  alert, approp, nl sensorium with  no motor or cerebellar deficits apparent.       Assessment & Plan:

## 2018-08-10 ENCOUNTER — Telehealth: Payer: Self-pay | Admitting: Internal Medicine

## 2018-08-10 NOTE — Telephone Encounter (Signed)
Lacey Stanton, is there any way you can print the med calendar out for pt so we can mail it to pt's address on file. Thanks!

## 2018-08-11 NOTE — Telephone Encounter (Signed)
Med cal mailed to the pt  Spoke with the pt and notified that this was done  Nothing further needed

## 2018-09-27 ENCOUNTER — Ambulatory Visit (INDEPENDENT_AMBULATORY_CARE_PROVIDER_SITE_OTHER): Payer: Medicare Other | Admitting: Internal Medicine

## 2018-09-27 ENCOUNTER — Other Ambulatory Visit: Payer: Self-pay

## 2018-09-27 ENCOUNTER — Encounter: Payer: Self-pay | Admitting: Internal Medicine

## 2018-09-27 DIAGNOSIS — J9611 Chronic respiratory failure with hypoxia: Secondary | ICD-10-CM | POA: Diagnosis not present

## 2018-09-27 DIAGNOSIS — J479 Bronchiectasis, uncomplicated: Secondary | ICD-10-CM

## 2018-09-27 MED ORDER — CEFDINIR 300 MG PO CAPS: 300.0000 mg | ORAL_CAPSULE | Freq: Two times a day (BID) | ORAL | 11 refills | Status: DC

## 2018-09-27 NOTE — Progress Notes (Signed)
Subjective:    Patient ID: Lacey Stanton, female    DOB: 1926/10/03   MRN: [id number]   Brief patient profile:  61  yowf never smoker with obstructive  bronchiectasis with resp symptoms dating back to  the 1980's of cough/sob    History of Present Illness  06/29/2012 f/u ov/Lacey Stanton re bronchiectasis on 02 just at hs and bud/brovana bid maint rx Chief Complaint  Patient presents with  . Follow-up    Pt c/o increased SOB for the past month. She feels that nebs are not lasting long enough any longer.   at baseline maintaining brovana budesonide and rarely needing saba hfa Then one month pta fever no cough but sob and increased proaire and used 02  > ov with Dr Lacey Stanton rx Avelox and fever resolved but not breathing to her satisfaction and using proaire 2 x daily but no neb saba. Doe x > slow adls rec increase zantac (ranitidine) to 150 mg after bfast and after supper  GERD  diet Continue to use the nebulizer brovana and budesonide and then 12 hours later. Prednisone 10 mg take  4 each am x 2 days,   2 each am x 2 days,  1 each am x2days and stop  Work on inhaler technique    Only use your albuterol (proaire) as rescue  02/06/2013 f/u ov/Lacey Stanton re: bronchiectasis on maint brovana/ bud plus prn saba hfa Chief Complaint  Patient presents with  . Follow-up    Pt states doing well and denies any new co's today.   typically more congested after lunch and feels her self wheezing so uses albuterol but not really helping much and  not using mucinex much  At all nor the flutter. Not limited by breathing though from desired activities rec Pantoprazole (protonix) 40 mg   Take 30-60 min before first meal of the day and Zantac 150  mg one @ bedtime until return to office GERD diet For cough > flutter valve and mucinex or mucinex dm up 1200 mg every 12hours  Relax and do purse lip breath breathing as much as possible.     10/18/2017  f/u ov/Lacey Stanton re: bronchiectasis Chief Complaint  Patient presents with   . Follow-up    Breathing is "okay". She states still coughing up yellow sputum.  She is using her albuterol inhaler 2 x per wk.   Dyspnea:  MMRC2 = can't walk a nl pace on a flat grade s sob but does fine slow and flat / slowed by coughing Cough: minimal yellow esp am  Sleeping: ok on back 2 pillows / bed flat  SABA use: twice a week 02 2lpm hs / rarely uses during the day  rec Plan A = Automatic =  Brovana / Budesonide twice daily  Plan B = Backup for breathing/ congestion Only use your albuterol as a rescue medication Plan C = Crisis - only use your albuterol nebulizer if you first try Plan B and it fails to help > ok to use the nebulizer up to every 4 hours but if start needing it regularly call for immediate appointment For cough / congestion > mucinex dm up to 1200 mg every 12 hours and use the flutter valve as much as possible  Prednisone 10 mg take  4 each am x 2 days,   2 each am x 2 days,  1 each am x 2 days and stop       03/03/2018  f/u ov/Lacey Stanton re: obstructive bronchiectasis  Chief  Complaint  Patient presents with  . Follow-up    Pepcid not working, still having colored mucus, oxygen levels dropping even at rest, on oxygen at night, SOB  Dyspnea: 2 wheeled walker / mailbox 50 ft flat is her limit = MMRC3 = can't walk 100 yards even at a slow pace at a flat grade s stopping due to sob   Cough: about the same/ cipro really didn't help but can't tol avelox and pcn allergic per records/ not using flutter or mucinex as rec  Sleeping: bed flat/ 3 pillows SABA use: 3-4 x per week 02: 2lpm hs rec  Plan A = Automatic =  Brovana / Budesonide 1st thing in am and 12 hours later Plan B = Backup for breathing/ congestion Only use your albuterol as a rescue medication Work on inhaler technique:   Plan C = Crisis - only use your albuterol nebulizer if you first try Plan B  For cough / congestion > mucinex dm up to 1200 mg every 12 hours and use the flutter valve as much as possible   Prednisone 10 mg take  4 each am x 2 days,   2 each am x 2 days,  1 each am x 2 days and stop  Zpak   Sputum culture 03/31/18  E coli > rx omnicef x 7 days > much better     08/09/2018  f/u ov/Lacey Stanton re: bronchiectasis on brovana/bud bid  Confused with details of care, did not bring new med calendar but says "best she's been in a while"  Chief Complaint  Patient presents with  . Other    Bronchiectasis - patient has been doing well since last visit.Medications have been working.  Dyspnea:  Walk in hallways at home and mailbox with a cane or walker  Cough: some in am dark but white  Sleeping:  1- 2 pillows but bed is flat  SABA use: not needing  02: 2lpm hs and famotidine but still with breakthru sense of hb/ "Chest congestion " at hs rec Try taking pepcid 40 mg at bedtime if having night time symptoms of heart burn or congestion GERD diet    Acute Virtual Visit via Telephone Note 09/27/2018   I connected with Lacey Stanton on 09/27/18 at 10:15 AM EDT by telephone and verified that I am speaking with the correct person using two identifiers.   I discussed the limitations, risks, security and privacy concerns of performing an evaluation and management service by telephone and the availability of in person appointments. I also discussed with the patient that there may be a patient responsible charge related to this service. The patient expressed understanding and agreed to proceed.   History of Present Illness: Dyspnea:  No change doe = MMRC3 = can't walk 100 yards even at a slow pace at a flat grade s stopping due to sob   Cough: just finished a week of cipro 09/25/18 for eye problems  Sleeping: using bed blocks  SABA use: none  02: 2lpm hs only   Concerned with bad chill around 130 pm 8/31  p eye appt, lasted hours and temp was 99.9 Appetite ok , can smell ok  Mucus turning dark  No short of breath, no nausea, vomiting  or diarrhea no cp   02 96% RA at rest        Meds  reviewed/ med reconciliation completed         Observations/Objective: Actually sounds great on phone, minimally congested sounding cough  Assessment and Plan: See problem list for active a/p's   Follow Up Instructions: See avs for instructions unique to this ov which includes revised/ updated med list     I discussed the assessment and treatment plan with the patient. The patient was provided an opportunity to ask questions and all were answered. The patient agreed with the plan and demonstrated an understanding of the instructions.   The patient was advised to call back or seek an in-person evaluation if the symptoms worsen or if the condition fails to improve as anticipated.  I provided 25  minutes of non-face-to-face time during this encounter.   Sandrea HughsMichael Conita Amenta, MD             Past Medical History:  Bronchiectasis  - CT 11/07/08: Bronchiectasis in the lingular > RML plus small HH  - Sinus CT neg 04/17/09  - HFA 25 > 75% effective April 15, 2009  -PFT's 09/16/2010   FEV1  .89( 55%)  Ratio 49  No better with B2,  DLCO 62 > improved to 107 Pneumonia > cleared radiographically 07/01/2010     - Pneumovax March 2011  Allergic Rhinitis  Pulmonary hemorrhage 1979   Osteoarthritis      - s/p  L TKR 02/2000 >>  Difficult recovery Hypothyroidism  GERD diet

## 2018-09-27 NOTE — Assessment & Plan Note (Signed)
03/28/13  Patient Saturations on Room Air at Rest = 93% Patient Saturations on [employer] while Ambulating = 88% Patient Saturations on 2 Liters of oxygen while Ambulating = 97% - 10/22/2014  Walked RA x 2 laps @ 185 ft each stopped due sob at slow pace but sats still 90%   - 07/28/2015  Walked RA x 3 laps @ 185 ft each stopped due to  End of study, nl pace, no sob or desat     Adequate control on present rx, reviewed in detail with pt > no change in rx needed    Rx = 02 2lpm NP hs and prn daytime

## 2018-09-27 NOTE — Assessment & Plan Note (Signed)
Onset of symptoms 1980s - CT 11/07/08: Bronchiectasis in the lingula  > rml plus small HH  - Sinus CT neg 04/17/09   -PFT's 09/16/2010   FEV1  .89( 55%)  Ratio 49  No better with B2,  DLCO 62 > improved to 107 - PFTs  08/10/2012    FEV1  0.78 (50%) ratio 58,  No better p B2  DLCO 55 improved to 86% - Alpha one AT phenotype 07/07/10: MM CT chest>   03/16/13 bronchiectasis with nodules - Spirometry 02/10/2015  FEV1  0.54 (33%) ratio 53   - PFT's  07/28/2015  FEV1 0.82 (56 % ) ratio 57  p 3 % improvement from saba p laba/ics neb  prior to study with DLCO  48/53 % corrects to 83 % for alv volume   - 07/02/16 rec doxy prn flares  - PFT's  07/12/2017  FEV1 0.65 (48 % ) ratio 59  p no % improvement from saba p nothing prior to study with DLCO  45 % corrects to 75 % for alv volume   - 07/12/17 Changed prn abx to cipro 750 bid x 10 days as doxy no longer effective  - 03/03/2018  After extensive coaching inhaler device,  effectiveness =    75%  (short Ti) - 09/27/2018 changed prn abx to omnicef 300 mg bid x 7 days based on previous response and sputum growing e coli 03/31/2018       >>>>  Acute symptoms x 24 h are suggestive of either flare of bronchiectasis or covid but either way sounds great this am   rec as above, to ER if sats dropping, follow med calendar/ action plan line by line

## 2018-09-27 NOTE — Patient Instructions (Addendum)
Omnicef 300 mg twice daily for 7 days as needed for nasty mucus - add this to your action plan at bottom of your med calendar    If 02 sats drops from mid 90s at rest on room air you will need to be evaluated in ER   We can't assume this is not COVID -19 so you need to isolate for the next 13 days and any contact will need to isolate with the other option being you get tested now or they get tested in 5 days but if any of your contacts get sick in the next week I would assume everyone has COVID as it's too soon for flu season.   See calendar for specific medication instructions and bring it back for each and every office visit for every healthcare provider you see.  Without it,  you may not receive the best quality medical care that we feel you deserve.  You will note that the calendar groups together  your maintenance  medications that are timed at particular times of the day.  Think of this as your checklist for what your doctor has instructed you to do until your next evaluation to see what benefit  there is  to staying on a consistent group of medications intended to keep you well.  The other group at the bottom is entirely up to you to use as you see fit  for specific symptoms that may arise between visits that require you to treat them on an as needed basis.  Think of this as your action plan or "what if" list.   Separating the top medications from the bottom group is fundamental to providing you adequate care going forward.    >> keep your prior appt.

## 2018-11-24 ENCOUNTER — Other Ambulatory Visit: Payer: Self-pay | Admitting: Internal Medicine

## 2018-12-28 DIAGNOSIS — L84 Corns and callosities: Secondary | ICD-10-CM | POA: Insufficient documentation

## 2018-12-28 DIAGNOSIS — B351 Tinea unguium: Secondary | ICD-10-CM | POA: Insufficient documentation

## 2018-12-28 DIAGNOSIS — L919 Hypertrophic disorder of the skin, unspecified: Secondary | ICD-10-CM | POA: Insufficient documentation

## 2019-02-09 ENCOUNTER — Ambulatory Visit: Payer: Medicare Other | Admitting: Internal Medicine

## 2019-02-13 ENCOUNTER — Other Ambulatory Visit: Payer: Self-pay

## 2019-02-13 ENCOUNTER — Ambulatory Visit (INDEPENDENT_AMBULATORY_CARE_PROVIDER_SITE_OTHER): Payer: Medicare Other | Admitting: Internal Medicine

## 2019-02-13 ENCOUNTER — Encounter: Payer: Self-pay | Admitting: Internal Medicine

## 2019-02-13 DIAGNOSIS — J479 Bronchiectasis, uncomplicated: Secondary | ICD-10-CM

## 2019-02-13 DIAGNOSIS — J9611 Chronic respiratory failure with hypoxia: Secondary | ICD-10-CM | POA: Diagnosis not present

## 2019-02-13 NOTE — Progress Notes (Signed)
Subjective:    Patient ID: Lacey Stanton, female    DOB: 1926-07-11   MRN: [id number]   Brief patient profile:  65   yowf never smoker with obstructive  bronchiectasis with resp symptoms dating back to  the 1980's of cough/sob    History of Present Illness  06/29/2012 f/u ov/Lacey Stanton re bronchiectasis on 02 just at hs and bud/brovana bid maint rx Chief Complaint  Patient presents with  . Follow-up    Pt c/o increased SOB for the past month. She feels that nebs are not lasting long enough any longer.   at baseline maintaining brovana budesonide and rarely needing saba hfa Then one month pta fever no cough but sob and increased proaire and used 02  > ov with Dr Martha Clan rx Avelox and fever resolved but not breathing to her satisfaction and using proaire 2 x daily but no neb saba. Doe x > slow adls rec increase zantac (ranitidine) to 150 mg after bfast and after supper  GERD  diet Continue to use the nebulizer brovana and budesonide and then 12 hours later. Prednisone 10 mg take  4 each am x 2 days,   2 each am x 2 days,  1 each am x2days and stop  Work on inhaler technique    Only use your albuterol (proaire) as rescue  02/06/2013 f/u ov/Lacey Stanton re: bronchiectasis on maint brovana/ bud plus prn saba hfa Chief Complaint  Patient presents with  . Follow-up    Pt states doing well and denies any new co's today.   typically more congested after lunch and feels her self wheezing so uses albuterol but not really helping much and  not using mucinex much  At all nor the flutter. Not limited by breathing though from desired activities rec Pantoprazole (protonix) 40 mg   Take 30-60 min before first meal of the day and Zantac 150  mg one @ bedtime until return to office GERD diet For cough > flutter valve and mucinex or mucinex dm up 1200 mg every 12hours  Relax and do purse lip breath breathing as much as possible.     10/18/2017  f/u ov/Lacey Stanton re: bronchiectasis Chief Complaint  Patient presents  with  . Follow-up    Breathing is "okay". She states still coughing up yellow sputum.  She is using her albuterol inhaler 2 x per wk.   Dyspnea:  MMRC2 = can't walk a nl pace on a flat grade s sob but does fine slow and flat / slowed by coughing Cough: minimal yellow esp am  Sleeping: ok on back 2 pillows / bed flat  SABA use: twice a week 02 2lpm hs / rarely uses during the day  rec Plan A = Automatic =  Brovana / Budesonide twice daily  Plan B = Backup for breathing/ congestion Only use your albuterol as a rescue medication Plan C = Crisis - only use your albuterol nebulizer if you first try Plan B and it fails to help > ok to use the nebulizer up to every 4 hours but if start needing it regularly call for immediate appointment For cough / congestion > mucinex dm up to 1200 mg every 12 hours and use the flutter valve as much as possible  Prednisone 10 mg take  4 each am x 2 days,   2 each am x 2 days,  1 each am x 2 days and stop       03/03/2018  f/u ov/Lacey Stanton re: obstructive bronchiectasis  Chief Complaint  Patient presents with  . Follow-up    Pepcid not working, still having colored mucus, oxygen levels dropping even at rest, on oxygen at night, SOB  Dyspnea: 2 wheeled walker / mailbox 50 ft flat is her limit = MMRC3 = can't walk 100 yards even at a slow pace at a flat grade s stopping due to sob   Cough: about the same/ cipro really didn't help but can't tol avelox and pcn allergic per records/ not using flutter or mucinex as rec  Sleeping: bed flat/ 3 pillows SABA use: 3-4 x per week 02: 2lpm hs rec  Plan A = Automatic =  Brovana / Budesonide 1st thing in am and 12 hours later Plan B = Backup for breathing/ congestion Only use your albuterol as a rescue medication Work on inhaler technique:   Plan C = Crisis - only use your albuterol nebulizer if you first try Plan B  For cough / congestion > mucinex dm up to 1200 mg every 12 hours and use the flutter valve as much as  possible  Prednisone 10 mg take  4 each am x 2 days,   2 each am x 2 days,  1 each am x 2 days and stop  Zpak   Sputum culture 03/31/18  E coli > rx omnicef x 7 days > much better     08/09/2018  f/u ov/Lacey Stanton re: bronchiectasis on brovana/bud bid  Confused with details of care, did not bring new med calendar but says "best she's been in a while"  Chief Complaint  Patient presents with  . Other    Bronchiectasis - patient has been doing well since last visit.Medications have been working.  Dyspnea:  Walk in hallways at home and mailbox with a cane or walker  Cough: some in am dark but white  Sleeping:  1- 2 pillows but bed is flat  SABA use: not needing  02: 2lpm hs and famotidine but still with breakthru sense of hb/ "Chest congestion " at hs rec Pepcid hs   09/27/2018 televisit Omnicef 300 mg twice daily for 7 days as needed for nasty mucus - add this to your action plan at bottom of your med calendar  If 02 sats drops from mid 90s at rest on room air you will need to be evaluated in ER  We can't assume this is not COVID -19 so you need to isolate for the next 13 days and any contact will need to isolate with the other option being you get tested now or they get tested in 5 days but if any of your contacts get sick in the next week I would assume everyone has COVID as it's too soon for flu season.  See calendar for specific medication instructions     02/13/2019  f/u ov/Lacey Stanton re: bronchiectasis  Chief Complaint  Patient presents with  . Follow-up    Breathing is overall doing well. She is coughing more at night- producing light yellow to clear sputum.    Dyspnea:  Cane at home mb and back slow pace does ok  Cough: at hs / minimally discolored, much better p omnicef x 7d   SABA use: none needed 02: 2lpm  Hs but not needed during the day    No obvious day to day or daytime variability or assoc excess/ purulent sputum or mucus plugs or hemoptysis or cp or chest tightness, subjective  wheeze or overt sinus or hb symptoms.  Also denies any obvious fluctuation of symptoms with weather or environmental changes or other aggravating or alleviating factors except as outlined above   No unusual exposure hx or h/o childhood pna/ asthma or knowledge of premature birth.  Current Allergies, Complete Past Medical History, Past Surgical History, Family History, and Social History were reviewed in [employer] record.  ROS  The following are not active complaints unless bolded Hoarseness, sore throat, dysphagia, dental problems, itching, sneezing,  nasal congestion or discharge of excess mucus or purulent secretions, ear ache,   fever, chills, sweats, unintended wt loss or wt gain, classically pleuritic or exertional cp,  orthopnea pnd or arm/hand swelling  or leg swelling, presyncope, palpitations, abdominal pain, anorexia, nausea, vomiting, diarrhea  or change in bowel habits or change in bladder habits, change in stools or change in urine, dysuria, hematuria,  rash, arthralgias, visual complaints, headache, numbness, weakness or ataxia or problems with walking or coordination,  change in mood or  memory.        Current Meds  Medication Sig  . acetaminophen (TYLENOL) 325 MG tablet Per bottle as needed  . albuterol (PROAIR HFA) 108 (90 Base) MCG/ACT inhaler Inhale 2 puffs into the lungs every 4 (four) hours as needed for wheezing.  Marland Kitchen albuterol (PROVENTIL) (2.5 MG/3ML) 0.083% nebulizer solution Take 3 mLs (2.5 mg total) by nebulization every 4 (four) hours as needed for wheezing or shortness of breath.  Marland Kitchen BIOTIN PO Take 1,000 mg by mouth daily.  Marland Kitchen BROVANA 15 MCG/2ML NEBU USE 1 VIAL  IN  NEBULIZER TWICE  DAILY - morning and evening  . budesonide (PULMICORT) 0.5 MG/2ML nebulizer solution USE 1 VIAL  IN  NEBULIZER TWICE  DAILY - rinse mouth after treatment  . Chlorphen-Pseudoephed-APAP (ALLERGY/SINUS PAIN RELIEVER PO) Per box as needed  . Cholecalciferol (VITAMIN D-3)  5000 units TABS Take 1 tablet by mouth daily.  Marland Kitchen dextromethorphan-guaiFENesin (MUCINEX DM) 30-600 MG 12hr tablet Take 1 tablet by mouth 2 (two) times daily as needed for cough.  . famotidine (PEPCID) 20 MG tablet TAKE ONE TABLET BY MOUTH EVERY DAY AT BEDTIME  . levothyroxine (SYNTHROID, LEVOTHROID) 50 MCG tablet Take by mouth daily.    . Magnesium 250 MG TABS Take 1 tablet by mouth daily.  . Melatonin 10 MG TABS Take 1 tablet by mouth at bedtime.  . OXYGEN 2 lpm with sleep and as needed during the day  . pantoprazole (PROTONIX) 40 MG tablet Take 1 tablet (40 mg total) by mouth daily. Take 30-60 min before first meal of the day  . Prenatal Vit-Fe Sulfate-FA (PRENATAL MULTIVIT-IRON PO) Take 1 tablet by mouth daily.  . Probiotic CAPS Take 1 capsule by mouth daily.  Marland Kitchen Respiratory Therapy Supplies (FLUTTER) DEVI Use as directed  . verapamil (CALAN-SR) 120 MG CR tablet Take 120 mg by mouth at bedtime.            .              Past Medical History:  Bronchiectasis  - CT 11/07/08: Bronchiectasis in the lingular > RML plus small HH  - Sinus CT neg 04/17/09  - HFA 25 > 75% effective April 15, 2009  -PFT's 09/16/2010   FEV1  .89( 55%)  Ratio 49  No better with B2,  DLCO 62 > improved to 107 Pneumonia > cleared radiographically 07/01/2010     - Pneumovax March 2011  Allergic Rhinitis  Pulmonary hemorrhage 1979   Osteoarthritis      -  s/p  L TKR 02/2000 >>  Difficult recovery Hypothyroidism  GERD diet       Objective:   Physical Exam   amb wf nad   02/13/2019     08/09/2018    115  Wt 125 08/03/2010  > 122 09/16/2010  > 128 12/09/2010 > 03/15/2011  133> 1/61096  129> 11/08/2012 131 >  10/22/2014   129 > 02/10/2015  113 > 07/28/2015 107 > 02/29/2016  113 > 07/02/2016 112 > 10/05/2016  107 > 04/06/2017   118 > 07/12/2017   117 > 10/18/2017  113 > 12/05/2017 113 > 03/03/2018   115    Vital signs reviewed  02/13/2019  - Note at rest 02 sats  93% on RA      HEENT: Full dentures, minimal insp/exp rhonchi  bilaterally without cough on insp or exp maneuvers  Reports full dentures   HEENT : pt wearing mask not removed for exam due to covid -19 concerns.    NECK :  without JVD/Nodes/TM/ nl carotid upstrokes bilaterally   LUNGS: no acc muscle use,  Mildly kyphotic contour chest with minimal insp/exp rhonchi bilaterally without cough on insp or exp maneuvers   CV:  RRR  no s3 or murmur or increase in P2, and no edema   ABD:  soft and nontender with nl inspiratory excursion in the supine position. No bruits or organomegaly appreciated, bowel sounds nl  MS:  Nl gait/ ext warm without deformities, calf tenderness, cyanosis or clubbing No obvious joint restrictions   SKIN: warm and dry without lesions    NEURO:  alert, approp, nl sensorium with  no motor or cerebellar deficits apparent.       Assessment & Plan:

## 2019-02-13 NOTE — Patient Instructions (Addendum)
For night time cough take either mucinex dm or your over the counter allergy which has Chlorpheniramine 4 mg at bedime   Take the covid vaccine as soon as it is offered   Please schedule a follow up visit in 6 months but call sooner if needed

## 2019-02-14 ENCOUNTER — Encounter: Payer: Self-pay | Admitting: Internal Medicine

## 2019-02-14 NOTE — Assessment & Plan Note (Signed)
Onset of symptoms 1980s - CT 11/07/08: Bronchiectasis in the lingula  > rml plus small HH  - Sinus CT neg 04/17/09   -PFT's 09/16/2010   FEV1  .89( 55%)  Ratio 49  No better with B2,  DLCO 62 > improved to 107 - PFTs  08/10/2012    FEV1  0.78 (50%) ratio 58,  No better p B2  DLCO 55 improved to 86% - Alpha one AT phenotype 07/07/10: MM CT chest>   03/16/13 bronchiectasis with nodules - Spirometry 02/10/2015  FEV1  0.54 (33%) ratio 53   - PFT's  07/28/2015  FEV1 0.82 (56 % ) ratio 57  p 3 % improvement from saba p laba/ics neb  prior to study with DLCO  48/53 % corrects to 83 % for alv volume   - 07/02/16 rec doxy prn flares  - PFT's  07/12/2017  FEV1 0.65 (48 % ) ratio 59  p no % improvement from saba p nothing prior to study with DLCO  45 % corrects to 75 % for alv volume   - 07/12/17 Changed prn abx to cipro 750 bid x 10 days as doxy no longer effective  - 03/03/2018  After extensive coaching inhaler device,  effectiveness =    75%  (short Ti) - 09/27/2018 changed prn abx to omnicef 300 mg bid x 7 days based on previous repsonse and sputum growing e coli 03/31/2018      Adequate control on present rx, reviewed in detail with pt > no change in rx needed  = brovana/ bud neb/ prn saba and omnicef for flares         Each maintenance medication was reviewed in detail including emphasizing most importantly the difference between maintenance and prns and under what circumstances the prns are to be triggered using an action plan format that is not reflected in the computer generated alphabetically organized AVS which I have not found useful in most complex patients, especially with respiratory illnesses  Total time for H and P, chart review, counseling,   and generating AVS / charting =  20 min

## 2019-02-14 NOTE — Assessment & Plan Note (Signed)
03/28/13 Qualification  Patient Saturations on Room Air at Rest = 93% Patient Saturations on [employer] while Ambulating = 88% Patient Saturations on 2 Liters of oxygen while Ambulating = 97% - 10/22/2014  Walked RA x 2 laps @ 185 ft each stopped due sob at slow pace but sats still 90%   - 07/28/2015  Walked RA x 3 laps @ 185 ft each stopped due to  End of study, nl pace, no sob or desat    Adequate control on present rx, reviewed in detail with pt > no change in rx needed  = 2lpm hs, not needed during the day.

## 2019-03-28 DIAGNOSIS — S22059D Unspecified fracture of T5-T6 vertebra, subsequent encounter for fracture with routine healing: Secondary | ICD-10-CM | POA: Insufficient documentation

## 2019-04-18 ENCOUNTER — Ambulatory Visit: Payer: Medicare Other

## 2019-04-18 ENCOUNTER — Ambulatory Visit (INDEPENDENT_AMBULATORY_CARE_PROVIDER_SITE_OTHER): Payer: Medicare Other

## 2019-04-18 ENCOUNTER — Telehealth: Payer: Self-pay | Admitting: Internal Medicine

## 2019-04-18 ENCOUNTER — Encounter: Payer: Self-pay | Admitting: Acute Care

## 2019-04-18 ENCOUNTER — Ambulatory Visit (INDEPENDENT_AMBULATORY_CARE_PROVIDER_SITE_OTHER): Payer: Medicare Other | Admitting: Acute Care

## 2019-04-18 ENCOUNTER — Other Ambulatory Visit: Payer: Self-pay

## 2019-04-18 DIAGNOSIS — R531 Weakness: Secondary | ICD-10-CM

## 2019-04-18 DIAGNOSIS — J479 Bronchiectasis, uncomplicated: Secondary | ICD-10-CM | POA: Diagnosis not present

## 2019-04-18 DIAGNOSIS — R410 Disorientation, unspecified: Secondary | ICD-10-CM

## 2019-04-18 MED ORDER — CEFDINIR 300 MG PO CAPS: 300.0000 mg | ORAL_CAPSULE | Freq: Two times a day (BID) | ORAL | 0 refills | Status: DC

## 2019-04-18 NOTE — Addendum Note (Signed)
Addended by: Kandice Robinsons F on: 04/18/2019 04:38 PM   Modules accepted: Orders

## 2019-04-18 NOTE — Patient Instructions (Addendum)
It is good to talk to you today Congratulations on getting your Covid vaccine.  This is wonderful news We will have you come to the office today for a chest x-ray to make sure you do not have pneumonia We will also walk you today in the office to ensure that you are not dropping your oxygen levels while you are walking. We will also draw some labs today while you are in the office. We will call you with results You are scheduled for a chest x-ray at 245, a walk at 3 PM, and labs while you are there. Please switch the Mucinex DM to plain Mucinex. I am concerned that the DM component may be contributing to the weakness you are feeling. Continue taking the plain Mucinex, without DM, 1200 mg once daily with a full glass of water Continue Brovana and budesonide as you have been doing twice a day Remember it is okay to use your rescue albuterol as needed for shortness of breath or wheezing Once I have reviewed your chest x-ray I will call you with the results and if necessary we will call in an antibiotic. We will schedule a follow-up televisit in 2 weeks to see how you are doing. Please contact office for sooner follow up if symptoms do not improve or worsen or seek emergency care

## 2019-04-18 NOTE — Progress Notes (Addendum)
Virtual Visit via Telephone Note  I connected with Lacey Stanton on 04/18/19 at 11:00 AM EDT by telephone and verified that I am speaking with the correct person using two identifiers.  Location: Patient: At home Provider: Working remotely from home   I discussed the limitations, risks, security and privacy concerns of performing an evaluation and management service by telephone and the availability of in person appointments. I also discussed with the patient that there may be a patient responsible charge related to this service. The patient expressed understanding and agreed to proceed.  Brief patient profile:  84   yowf never smoker with obstructive  bronchiectasis with resp symptoms dating back to  the 1980's of cough/sob    History of Present Illness: Pt presents for an acute visit for weakness and cough.  She states that  3-4 weeks ago she stood up and felt like she was going to faint, broke out in a sweat and has felt weak since. She has been using Mucinex DM.She denies any fever. Her saturation was 94%. She is compliant with her oxygen at 2  L at bedtime. She states she has more of a cough, but the Mucinex DM helped the cough.She is compliant with her Brovana and her Pulmicort. She uses them twice daily. Her secretions are light yellow. She has used her rescue inhaler 4 times over the last several weeks. She is concerned about how much mucus she has.She endorses cloudy thinking in addition to fatigue. I am concerned she may be desaturating with exertion. She has had no Covid exposures. She had the [place] and [employer] vaccine 04/04/2019.She did not have any issues with the vaccine.  She denies fever, chest pain, orthopnea, or hemoptysis   Observations/Objective:  Sputum culture 03/31/18  E coli > rx omnicef x 7 days > much better   04/18/2019 CXR>> reviewed personally by me IMPRESSION: 1. Diffuse scarring and bronchiectasis.  No acute airspace disease. 2. Stable compression deformities  at T6, T12, and L2.  04/18/2019:Ambulatory saturation walk She did not desaturate with ambulation  CBC pending   BMETpending  Assessment and Plan: Office visit today for chest x-ray, ambulatory saturation walk, and lab work We will call you with results Switch Mucinex DM to plain Mucinex, take 1200 mg daily with full glass of water Continue Brovana and budesonide nebulizer treatments as you have been doing twice a day Use your rescue albuterol as needed for shortness of breath or wheezing Use your flutter valve 3-4 times daily while you are not well. Continue nocturnal oxygen at 2 L nasal cannula at bedtime 2-week follow-up with Maralyn Sago NP to evaluate how you are doing  Addendum CXR is clear, but patient is symptomatic No desaturations with walk We will send in Omnicef 300 mg twice daily for 7 days>> patient has taken this antibiotic in the past with no issues or hives.  Take with Probiotic with antibiotic Call the office  if you get worse not better Please contact office for sooner follow up if symptoms do not improve or worsen or seek emergency care    Follow Up Instructions: Follow-up televisit in 2 weeks with Maralyn Sago NP   I discussed the assessment and treatment plan with the patient. The patient was provided an opportunity to ask questions and all were answered. The patient agreed with the plan and demonstrated an understanding of the instructions.   The patient was advised to call back or seek an in-person evaluation if the symptoms worsen or if the  condition fails to improve as anticipated.  I provided 40 minutes of non-face-to-face time during this encounter. This included charting, review of CXR and ambulatory saturation walk results, and follow up phone call to the patient.   Magdalen Spatz, NP 04/18/2019 4:34 PM

## 2019-04-18 NOTE — Telephone Encounter (Signed)
Spoke with pt. Reports that she is still having issues with coughing and weakness. Advised her that she would have to have a televisit due to her symptoms. She agreed and has been scheduled today with Sarah at 1100.

## 2019-04-19 NOTE — Progress Notes (Signed)
These results were shared with the patient 04/18/2019 by phone. She verbalized understanding. See Tele visit note dated 3/24.

## 2019-05-02 ENCOUNTER — Encounter: Payer: Self-pay | Admitting: Adult Health

## 2019-05-02 ENCOUNTER — Other Ambulatory Visit: Payer: Self-pay

## 2019-05-02 ENCOUNTER — Ambulatory Visit (INDEPENDENT_AMBULATORY_CARE_PROVIDER_SITE_OTHER): Payer: Medicare Other | Admitting: Adult Health

## 2019-05-02 DIAGNOSIS — J9611 Chronic respiratory failure with hypoxia: Secondary | ICD-10-CM | POA: Diagnosis not present

## 2019-05-02 DIAGNOSIS — R0600 Dyspnea, unspecified: Secondary | ICD-10-CM

## 2019-05-02 DIAGNOSIS — R0609 Other forms of dyspnea: Secondary | ICD-10-CM

## 2019-05-02 DIAGNOSIS — J479 Bronchiectasis, uncomplicated: Secondary | ICD-10-CM

## 2019-05-02 DIAGNOSIS — I7300 Raynaud's syndrome without gangrene: Secondary | ICD-10-CM | POA: Insufficient documentation

## 2019-05-02 MED ORDER — PREDNISONE 20 MG PO TABS: 20.0000 mg | ORAL_TABLET | Freq: Every day | ORAL | 0 refills | Status: DC

## 2019-05-02 MED ORDER — AZITHROMYCIN 250 MG PO TABS: ORAL_TABLET | ORAL | 0 refills | Status: AC

## 2019-05-02 NOTE — Assessment & Plan Note (Signed)
This is multifactorial with underlying comorbidities, deconditioning, bronchiectasis and probable diastolic function.  Continue with follow-up with primary care provider for lower extremity edema. We will hold on diuretics at this time as patient has significant medication sensitivities and does not appear to be in overt volume overload

## 2019-05-02 NOTE — Assessment & Plan Note (Signed)
Continue on oxygen 2 L at bedtime 

## 2019-05-02 NOTE — Patient Instructions (Addendum)
Zpack take as directed.  Restart Mucinex DM .Twice daily  As needed  Cough/congestion.  Flutter valve Three times a day  Continue on Budesonide and Brovana Neb Twice daily  .  Prednisone 20mg  daily for 4 days .  Follow up with Dr.  In 6-8 weeks and As needed   Please contact office for sooner follow up if symptoms do not improve or worsen or seek emergency care

## 2019-05-02 NOTE — Progress Notes (Signed)
@Patient  ID: , female    DOB: 1926-11-27, 86 y.o.   MRN: [id number]  Chief Complaint  Patient presents with  . Follow-up    Bronchiectasis     Referring provider: No ref. provider found  HPI: 86 year old female never smoker followed for bronchiectasis  TEST/EVENTS :  Onset of symptoms 1980s - CT 11/07/08: Bronchiectasis in the lingula  > rml plus small HH  - Sinus CT neg 04/17/09   -PFT's 09/16/2010   FEV1  .89( 55%)  Ratio 49  No better with B2,  DLCO 62 > improved to 107 - PFTs  08/10/2012    FEV1  0.78 (50%) ratio 58,  No better p B2  DLCO 55 improved to 86% - Alpha one AT phenotype 07/07/10: MM CT chest>   03/16/13 bronchiectasis with nodules - Spirometry 02/10/2015  FEV1  0.54 (33%) ratio 53   - PFT's  07/28/2015  FEV1 0.82 (56 % ) ratio 57  p 3 % improvement from saba p laba/ics neb  prior to study with DLCO  48/53 % corrects to 83 % for alv volume   - 07/02/16 rec doxy prn flares  - PFT's  07/12/2017  FEV1 0.65 (48 % ) ratio 59  p no % improvement from saba p nothing prior to study with DLCO  45 % corrects to 75 % for alv volume   - 07/12/17 Changed prn abx to cipro 750 bid x 10 days as doxy no longer effective  - 03/03/2018  After extensive coaching inhaler device,  effectiveness =    75%  (short Ti) - 09/27/2018 changed prn abx to omnicef 300 mg bid x 7 days based on previous repsonse and sputum growing e coli 03/31/2018      05/02/2019 Follow up : Bronchiectasis  Patient presents for a follow-up for bronchiectasis.  Patient was seen last visit with a bronchiectatic flare.  Chest x-ray showed chronic changes with no acute process.  She was given Omnicef.  Patient says she only took a couple days because she felt like it caused stomach upset and constipation.  He complains of a persistent ongoing cough that has increased it is minimally productive she has not seen any discolored mucus.  She is also been having some increased shortness of breath.  Says that she has taken prednisone  in the past which has helped with this.  She is also been able to tolerate a Z-Pak in the past.  She denies any chest pain orthopnea PND leg swelling.  No fever.  No hemoptysis.  She remains on Pulmicort and Brovana nebulizer twice daily.  Her valve on occasion. Has been having difficulties with tolerating medications recently.  Also has been having some lower extremity swelling and pain in her legs.  She is followed up with primary care.  She had arterial Dopplers done this week.  Recent lab work showed mild anemia.  Electrolyte panel unrevealing.  proBNP slightly elevated at 228.  2D echo 2019 showed preserved EF and diastolic dysfunction.  Allergies  Allergen Reactions  . Pentoxifylline Other (See Comments) and Shortness Of Breath    Chest tightness and speech problem   . Amoxicillin     REACTION: hives  . Avelox [Moxifloxacin Hcl In Nacl] Itching  . Iron     Per pt can only tolerate iron injection  . Levaquin [Levofloxacin In D5w] Nausea Only  . Other Other (See Comments)  . Statins Other (See Comments)    Joint pain  .  Sulfa Antibiotics     Unknown reaction  . Symbicort [Budesonide-Formoterol Fumarate]     Pt reports she cannot take this d/t having glaucoma.  Has tried this and it affected eyes (eye pain).    Immunization History  Administered Date(s) Administered  . Influenza Split 11/07/2012, 11/08/2013, 10/01/2014  . Influenza Whole 10/25/2009, 10/26/2011, 10/26/2015  . Influenza, High Dose Seasonal PF 10/07/2014, 10/13/2015, 11/12/2016, 10/21/2017, 11/24/2018  . Influenza-Unspecified 11/07/2012, 11/08/2013, 10/01/2014  . Janssen (J&J) SARS-COV-2 Vaccination 04/04/2019  . Pneumococcal Conjugate-13 08/07/2013, 10/08/2013  . Pneumococcal Polysaccharide-23 04/07/2009  . Tdap 07/08/2010  . Zoster 09/19/2007, 05/10/2011    Past Medical History:  Diagnosis Date  . Allergic rhinitis   . Bronchiectasis    CT 11/07/08: bronchiectasis in the lingular > rml plus small HH.  Sinus  CT ne 04/17/09.  HFA 25>75% effective April 15, 2009  . GERD (gastroesophageal reflux disease)   . Hypothyroidism   . Osteoarthritis   . Pneumonia   . Pulmonary hemorrhage 1979    Tobacco History: Social History   Tobacco Use  Smoking Status Never Smoker  Smokeless Tobacco Never Used   Counseling given: Not Answered   Outpatient Medications Prior to Visit  Medication Sig Dispense Refill  . acetaminophen (TYLENOL) 325 MG tablet Per bottle as needed    . albuterol (PROAIR HFA) 108 (90 Base) MCG/ACT inhaler Inhale 2 puffs into the lungs every 4 (four) hours as needed for wheezing. 1 Inhaler 1  . albuterol (PROVENTIL) (2.5 MG/3ML) 0.083% nebulizer solution Take 3 mLs (2.5 mg total) by nebulization every 4 (four) hours as needed for wheezing or shortness of breath. 75 mL 12  . BIOTIN PO Take 1,000 mg by mouth daily.    Marland Kitchen BROVANA 15 MCG/2ML NEBU USE 1 VIAL  IN  NEBULIZER TWICE  DAILY - morning and evening 120 mL 11  . budesonide (PULMICORT) 0.5 MG/2ML nebulizer solution USE 1 VIAL  IN  NEBULIZER TWICE  DAILY - rinse mouth after treatment 120 mL 11  . cefdinir (OMNICEF) 300 MG capsule Take 1 capsule (300 mg total) by mouth 2 (two) times daily. 14 capsule 11  . Chlorphen-Pseudoephed-APAP (ALLERGY/SINUS PAIN RELIEVER PO) Per box as needed    . Cholecalciferol (VITAMIN D-3) 5000 units TABS Take 1 tablet by mouth daily.    Marland Kitchen dextromethorphan-guaiFENesin (MUCINEX DM) 30-600 MG 12hr tablet Take 1 tablet by mouth 2 (two) times daily as needed for cough.    . famotidine (PEPCID) 20 MG tablet TAKE ONE TABLET BY MOUTH EVERY DAY AT BEDTIME 30 tablet 11  . levothyroxine (SYNTHROID, LEVOTHROID) 50 MCG tablet Take by mouth daily.      . Magnesium 250 MG TABS Take 1 tablet by mouth daily.    . Melatonin 10 MG TABS Take 1 tablet by mouth at bedtime.    . OXYGEN 2 lpm with sleep and as needed during the day    . pantoprazole (PROTONIX) 40 MG tablet Take 1 tablet (40 mg total) by mouth daily. Take 30-60 min  before first meal of the day 30 tablet 5  . Prenatal Vit-Fe Sulfate-FA (PRENATAL MULTIVIT-IRON PO) Take 1 tablet by mouth daily.    . Probiotic CAPS Take 1 capsule by mouth daily.    Marland Kitchen Respiratory Therapy Supplies (FLUTTER) DEVI Use as directed 1 each 0  . verapamil (CALAN-SR) 120 MG CR tablet Take 120 mg by mouth at bedtime.  3  . traMADol (ULTRAM) 50 MG tablet SMARTSIG:1 Tablet(s) By Mouth Every 12 Hours    .  cefdinir (OMNICEF) 300 MG capsule Take 1 capsule (300 mg total) by mouth 2 (two) times daily. Pt has taken this medication without issues in the past 14 capsule 0   No facility-administered medications prior to visit.     Review of Systems:   Constitutional:   No  weight loss, night sweats,  Fevers, chills, + fatigue, or  lassitude.  HEENT:   No headaches,  Difficulty swallowing,  Tooth/dental problems, or  Sore throat,                No sneezing, itching, ear ache,  +nasal congestion, post nasal drip,   CV:  No chest pain,  Orthopnea, PND, swelling in lower extremities, anasarca, dizziness, palpitations, syncope.   GI  No heartburn, indigestion, abdominal pain, nausea, vomiting, diarrhea, change in bowel habits, loss of appetite, bloody stools.   Resp:   No chest wall deformity  Skin: no rash or lesions.  GU: no dysuria, change in color of urine, no urgency or frequency.  No flank pain, no hematuria   MS:  No joint pain or swelling.  No decreased range of motion.  No back pain.    Physical Exam  BP 132/62 (BP Location: Left Arm, Patient Position: Sitting, Cuff Size: Normal)   Pulse (!) 102   Temp 97.9 F (36.6 C) (Temporal)   Ht 5\' 1"  (1.549 m)   Wt 106 lb (48.1 kg)   SpO2 93% Comment: room air  BMI 20.03 kg/m   GEN: A/Ox3; pleasant , NAD, elderly    HEENT:  [place]/AT,   , NOSE-clear, THROAT-clear, no lesions, no postnasal drip or exudate noted.   NECK:  Supple w/ fair ROM; no JVD; normal carotid impulses w/o bruits; no thyromegaly or nodules palpated; no  lymphadenopathy.    RESP  Few trace rhonchi  no accessory muscle use, no dullness to percussion  CARD:  RRR, 1-2 /t6 SM   1+  peripheral edema, pulses intact, no cyanosis or clubbing.  GI:   Soft & nt; nml bowel sounds; no organomegaly or masses detected.   Musco: Warm bil, no deformities or joint swelling noted.   Neuro: alert, no focal deficits noted.    Skin: Warm, no lesions or rashes    Lab Results:  CBC    Component Value Date/Time   WBC 6.0 07/12/2017 1042   RBC 3.74 (L) 07/12/2017 1042   HGB 11.6 (L) 07/12/2017 1042   HCT 34.5 (L) 07/12/2017 1042   PLT 289.0 07/12/2017 1042   MCV 92.3 07/12/2017 1042   MCHC 33.6 07/12/2017 1042   RDW 14.7 07/12/2017 1042   LYMPHSABS 1.8 07/12/2017 1042   MONOABS 0.5 07/12/2017 1042   EOSABS 0.2 07/12/2017 1042   BASOSABS 0.1 07/12/2017 1042    BMET    Component Value Date/Time   NA 140 07/12/2017 1042   K 4.5 07/12/2017 1042   CL 104 07/12/2017 1042   CO2 29 07/12/2017 1042   GLUCOSE 96 07/12/2017 1042   BUN 19 07/12/2017 1042   CREATININE 0.79 07/12/2017 1042   CALCIUM 9.9 07/12/2017 1042   GFRNONAA 57 01/12/2007 0000   GFRAA 69 01/12/2007 0000    BNP No results found for: BNP  ProBNP    Component Value Date/Time   PROBNP 52.0 07/12/2017 1042    Imaging: DG Chest 2 View  Result Date: 04/18/2019 CLINICAL DATA:  Cough, chest pain, bronchiectasis EXAM: CHEST - 2 VIEW COMPARISON:  03/03/2018, 02/28/2019 FINDINGS: Frontal and lateral views of the chest  demonstrate a stable cardiac silhouette. Bilateral scarring and bronchiectasis again noted and unchanged. No airspace disease, effusion, or pneumothorax. Stable compression deformities at T6, T12, and L2. IMPRESSION: 1. Diffuse scarring and bronchiectasis.  No acute airspace disease. 2. Stable compression deformities at T6, T12, and L2. Electronically Signed   By: Randa Ngo M.D.   On: 04/18/2019 15:12      PFT Results Latest Ref Rng & Units 07/12/2017 07/28/2015  07/31/2012  FVC-Pre L 1.11 1.36 -  FVC-Predicted Pre % 58 69 64  FVC-Post L 1.11 1.43 1.39  FVC-Predicted Post % 58 72 65  Pre FEV1/FVC % % 59 58 58  Post FEV1/FCV % % 58 57 60  FEV1-Pre L 0.65 0.79 0.78  FEV1-Predicted Pre % 48 54 50  FEV1-Post L 0.65 0.82 0.83  DLCO UNC% % 45 48 55  DLCO COR %Predicted % 75 83 86  TLC L 5.19 4.85 3.82  TLC % Predicted % 110 103 81  RV % Predicted % 155 146 120    No results found for: NITRICOXIDE      Assessment & Plan:   No problem-specific Assessment & Plan notes found for this encounter.     Rexene Edison, NP 05/02/2019

## 2019-05-02 NOTE — Assessment & Plan Note (Signed)
Slow to resolve bronchiectatic exacerbation.  Patient will begin Z-Pak and a low-dose prednisone burst.  Continue pulmonary hygiene regimen with Mucinex and flutter valve.  Continue with her nebulizers with Pulmicort and Brovana.  Recent chest x-ray without acute process noted  Plan  Patient Instructions  Zpack take as directed.  Restart Mucinex DM .Twice daily  As needed  Cough/congestion.  Flutter valve Three times a day  Continue on Budesonide and Brovana Neb Twice daily  .  Prednisone 20mg  daily for 4 days .  Follow up with Dr.  In 6-8 weeks and As needed   Please contact office for sooner follow up if symptoms do not improve or worsen or seek emergency care

## 2019-05-24 ENCOUNTER — Ambulatory Visit: Payer: Medicare Other | Admitting: Podiatry

## 2019-06-04 ENCOUNTER — Ambulatory Visit: Payer: Medicare Other | Admitting: Podiatry

## 2019-06-13 ENCOUNTER — Ambulatory Visit (INDEPENDENT_AMBULATORY_CARE_PROVIDER_SITE_OTHER): Payer: Medicare Other | Admitting: Adult Health

## 2019-06-13 ENCOUNTER — Encounter: Payer: Self-pay | Admitting: Adult Health

## 2019-06-13 ENCOUNTER — Other Ambulatory Visit: Payer: Self-pay

## 2019-06-13 DIAGNOSIS — J479 Bronchiectasis, uncomplicated: Secondary | ICD-10-CM

## 2019-06-13 NOTE — Progress Notes (Signed)
@Patient  ID: , female    DOB: 1926-04-27, 86 y.o.   MRN: [id number]  Chief Complaint  Patient presents with  . Follow-up    Bronchiectasis     Referring provider: No ref. provider found  HPI: 86 yo female never smoker followed for Bronchiectasis   TEST/EVENTS :  Onset of symptoms 1980s - CT 11/07/08: Bronchiectasis in the lingula >rml plus small HH  - Sinus CT neg 04/17/09  -PFT's 09/16/2010 FEV1 .89( 55%) Ratio 49 No better with B2, DLCO 62 >improved to 107 - PFTs 08/10/2012 FEV1 0.78 (50%) ratio 58, No better p B2 DLCO 55 improved to 86% - Alpha one AT phenotype 07/07/10: MM CT chest>03/16/13 bronchiectasis with nodules - Spirometry 02/10/2015 FEV1 0.54 (33%) ratio 53  - PFT's 07/28/2015 FEV1 0.82 (56 % ) ratio 57 p 3 % improvement from saba p laba/ics neb prior to study with DLCO 48/53 % corrects to 83 % for alv volume  - 07/02/16 rec doxy prn flares  - PFT's 07/12/2017 FEV1 0.65 (48 % ) ratio 59 p no % improvement from saba p nothing prior to study with DLCO 45 % corrects to 75 % for alv volume  - 07/12/17 Changed prn abx to cipro 750 bid x 10 days as doxy no longer effective  - 03/03/2018 After extensive coaching inhaler device, effectiveness = 75% (short Ti) - 09/27/2018 changed prn abx to omnicef 300 mg bid x 7 days based on previous repsonse and sputum growing e coli 03/31/2018  06/13/2019 Follow up : Bronchiectasis  Patient returns for a 1 month follow-up. Patient was seen last visit with a bronchiectatic exacerbation. She was treated with a Z-Pak and short course of prednisone. Patient says she is feeling much better. Chronic cough is returned back to her baseline. She denies any fever, chest pain, orthopnea, edema. She remains on budesonide and Brovana nebulizers twice daily. She is using her flutter valve 3 times a day. Feels that her flutter valve really does help. She has received her Covid vaccine with 06/15/2019.   Allergies  Allergen Reactions  . Pentoxifylline Other (See Comments) and Shortness Of Breath    Chest tightness and speech problem   . Amoxicillin     REACTION: hives  . Avelox [Moxifloxacin Hcl In Nacl] Itching  . Iron     Per pt can only tolerate iron injection  . Levaquin [Levofloxacin In D5w] Nausea Only  . Other Other (See Comments)  . Statins Other (See Comments)    Joint pain  . Sulfa Antibiotics     Unknown reaction  . Symbicort [Budesonide-Formoterol Fumarate]     Pt reports she cannot take this d/t having glaucoma.  Has tried this and it affected eyes (eye pain).    Immunization History  Administered Date(s) Administered  . Influenza Split 11/07/2012, 11/08/2013, 10/01/2014  . Influenza Whole 10/25/2009, 10/26/2011, 10/26/2015  . Influenza, High Dose Seasonal PF 10/07/2014, 10/13/2015, 11/12/2016, 10/21/2017, 11/24/2018  . Influenza-Unspecified 11/07/2012, 11/08/2013, 10/01/2014  . Janssen (J&J) SARS-COV-2 Vaccination 04/04/2019  . Pneumococcal Conjugate-13 08/07/2013, 10/08/2013  . Pneumococcal Polysaccharide-23 04/07/2009  . Tdap 07/08/2010  . Zoster 09/19/2007, 05/10/2011    Past Medical History:  Diagnosis Date  . Allergic rhinitis   . Bronchiectasis    CT 11/07/08: bronchiectasis in the lingular > rml plus small HH.  Sinus CT ne 04/17/09.  HFA 25>75% effective April 15, 2009  . GERD (gastroesophageal reflux disease)   . Hypothyroidism   . Osteoarthritis   .  Pneumonia   . Pulmonary hemorrhage 1979    Tobacco History: Social History   Tobacco Use  Smoking Status Never Smoker  Smokeless Tobacco Never Used   Counseling given: Not Answered   Outpatient Medications Prior to Visit  Medication Sig Dispense Refill  . acetaminophen (TYLENOL) 325 MG tablet Per bottle as needed    . albuterol (PROAIR HFA) 108 (90 Base) MCG/ACT inhaler Inhale 2 puffs into the lungs every 4 (four) hours as needed for wheezing. 1 Inhaler 1  . albuterol  (PROVENTIL) (2.5 MG/3ML) 0.083% nebulizer solution Take 3 mLs (2.5 mg total) by nebulization every 4 (four) hours as needed for wheezing or shortness of breath. 75 mL 12  . BIOTIN PO Take 1,000 mg by mouth daily.    Marland Kitchen BROVANA 15 MCG/2ML NEBU USE 1 VIAL  IN  NEBULIZER TWICE  DAILY - morning and evening 120 mL 11  . budesonide (PULMICORT) 0.5 MG/2ML nebulizer solution USE 1 VIAL  IN  NEBULIZER TWICE  DAILY - rinse mouth after treatment 120 mL 11  . cefdinir (OMNICEF) 300 MG capsule Take 1 capsule (300 mg total) by mouth 2 (two) times daily. 14 capsule 11  . Chlorphen-Pseudoephed-APAP (ALLERGY/SINUS PAIN RELIEVER PO) Per box as needed    . Cholecalciferol (VITAMIN D-3) 5000 units TABS Take 1 tablet by mouth daily.    Marland Kitchen dextromethorphan-guaiFENesin (MUCINEX DM) 30-600 MG 12hr tablet Take 1 tablet by mouth 2 (two) times daily as needed for cough.    . famotidine (PEPCID) 20 MG tablet TAKE ONE TABLET BY MOUTH EVERY DAY AT BEDTIME 30 tablet 11  . levothyroxine (SYNTHROID, LEVOTHROID) 50 MCG tablet Take by mouth daily.      . Magnesium 250 MG TABS Take 1 tablet by mouth daily.    . Melatonin 10 MG TABS Take 1 tablet by mouth at bedtime.    . OXYGEN 2 lpm with sleep and as needed during the day    . pantoprazole (PROTONIX) 40 MG tablet Take 1 tablet (40 mg total) by mouth daily. Take 30-60 min before first meal of the day 30 tablet 5  . predniSONE (DELTASONE) 20 MG tablet Take 1 tablet (20 mg total) by mouth daily with breakfast. 4 tablet 0  . Prenatal Vit-Fe Sulfate-FA (PRENATAL MULTIVIT-IRON PO) Take 1 tablet by mouth daily.    . Probiotic CAPS Take 1 capsule by mouth daily.    Marland Kitchen Respiratory Therapy Supplies (FLUTTER) DEVI Use as directed 1 each 0  . traMADol (ULTRAM) 50 MG tablet SMARTSIG:1 Tablet(s) By Mouth Every 12 Hours    . verapamil (CALAN-SR) 120 MG CR tablet Take 120 mg by mouth at bedtime.  3   No facility-administered medications prior to visit.     Review of Systems:   Constitutional:    No  weight loss, night sweats,  Fevers, chills, + fatigue, or  lassitude.  HEENT:   No headaches,  Difficulty swallowing,  Tooth/dental problems, or  Sore throat,                No sneezing, itching, ear ache, nasal congestion, post nasal drip,   CV:  No chest pain,  Orthopnea, PND, swelling in lower extremities, anasarca, dizziness, palpitations, syncope.   GI  No heartburn, indigestion, abdominal pain, nausea, vomiting, diarrhea, change in bowel habits, loss of appetite, bloody stools.   Resp:    No chest wall deformity  Skin: no rash or lesions.  GU: no dysuria, change in color of urine, no urgency or  frequency.  No flank pain, no hematuria   MS:  No joint pain or swelling.  No decreased range of motion.  No back pain.    Physical Exam  BP 134/80 (BP Location: Left Arm, Cuff Size: Normal)   Pulse 76   Ht 5\' 1"  (1.549 m)   Wt 108 lb (49 kg)   SpO2 96%   BMI 20.41 kg/m   GEN: A/Ox3; pleasant , NAD, well nourished    HEENT:  [place]/AT,    NOSE-clear, THROAT-clear, no lesions, no postnasal drip or exudate noted.   NECK:  Supple w/ fair ROM; no JVD; normal carotid impulses w/o bruits; no thyromegaly or nodules palpated; no lymphadenopathy.    RESP  Clear  P & A; w/o, wheezes/ rales/ or rhonchi. no accessory muscle use, no dullness to percussion  CARD:  RRR, no m/r/g, no peripheral edema, pulses intact, no cyanosis or clubbing.  GI:   Soft & nt; nml bowel sounds; no organomegaly or masses detected.   Musco: Warm bil, no deformities or joint swelling noted.   Neuro: alert, no focal deficits noted.    Skin: Warm, no lesions or rashes    Lab Results:  CBC  BNP No results found for: BNP  ProBNP  Imaging: No results found.    PFT Results Latest Ref Rng & Units 07/12/2017 07/28/2015 07/31/2012  FVC-Pre L 1.11 1.36 -  FVC-Predicted Pre % 58 69 64  FVC-Post L 1.11 1.43 1.39  FVC-Predicted Post % 58 72 65  Pre FEV1/FVC % % 59 58 58  Post FEV1/FCV % % 58 57 60   FEV1-Pre L 0.65 0.79 0.78  FEV1-Predicted Pre % 48 54 50  FEV1-Post L 0.65 0.82 0.83  DLCO UNC% % 45 48 55  DLCO COR %Predicted % 75 83 86  TLC L 5.19 4.85 3.82  TLC % Predicted % 110 103 81  RV % Predicted % 155 146 120    No results found for: NITRICOXIDE      Assessment & Plan:   Obstructive bronchiectasis (HCC) Exacerbation now improved after antibiotics and short steroid burst. Patient is continue on current regimen continue with pulmonary hygiene regimen with nebulizers and flutter valve.  Plan  Patient Instructions  Mucinex DM .Twice daily  As needed  Cough/congestion.  Flutter valve Three times a day  Continue on Budesonide and Brovana Neb Twice daily  .  Follow up with Dr. 10/01/2012  In 3-4 months and As needed   Please contact office for sooner follow up if symptoms do not improve or worsen or seek emergency care            Sherene Sires, NP 06/13/2019

## 2019-06-13 NOTE — Assessment & Plan Note (Signed)
Exacerbation now improved after antibiotics and short steroid burst. Patient is continue on current regimen continue with pulmonary hygiene regimen with nebulizers and flutter valve.  Plan  Patient Instructions  Mucinex DM .Twice daily  As needed  Cough/congestion.  Flutter valve Three times a day  Continue on Budesonide and Brovana Neb Twice daily  .  Follow up with Dr. Sherene Sires  In 3-4 months and As needed   Please contact office for sooner follow up if symptoms do not improve or worsen or seek emergency care

## 2019-06-13 NOTE — Patient Instructions (Signed)
Mucinex DM .Twice daily  As needed  Cough/congestion.  Flutter valve Three times a day  Continue on Budesonide and Brovana Neb Twice daily  .  Follow up with Dr. Sherene Sires  In 3-4 months and As needed   Please contact office for sooner follow up if symptoms do not improve or worsen or seek emergency care

## 2019-06-18 ENCOUNTER — Ambulatory Visit (INDEPENDENT_AMBULATORY_CARE_PROVIDER_SITE_OTHER): Payer: Medicare Other | Admitting: Podiatry

## 2019-06-18 ENCOUNTER — Ambulatory Visit (INDEPENDENT_AMBULATORY_CARE_PROVIDER_SITE_OTHER): Payer: Medicare Other

## 2019-06-18 ENCOUNTER — Other Ambulatory Visit: Payer: Self-pay

## 2019-06-18 DIAGNOSIS — I7300 Raynaud's syndrome without gangrene: Secondary | ICD-10-CM

## 2019-06-18 DIAGNOSIS — B353 Tinea pedis: Secondary | ICD-10-CM

## 2019-06-18 DIAGNOSIS — I872 Venous insufficiency (chronic) (peripheral): Secondary | ICD-10-CM

## 2019-06-18 DIAGNOSIS — M7989 Other specified soft tissue disorders: Secondary | ICD-10-CM | POA: Diagnosis not present

## 2019-06-18 DIAGNOSIS — B351 Tinea unguium: Secondary | ICD-10-CM

## 2019-06-18 MED ORDER — CLOTRIMAZOLE 1 % EX OINT: 0.5000 g | TOPICAL_OINTMENT | Freq: Two times a day (BID) | CUTANEOUS | 0 refills | Status: DC

## 2019-06-18 NOTE — Progress Notes (Signed)
  Subjective:  Patient ID: Lacey Stanton, female    DOB: [birth year],  MRN: [id number]  Chief Complaint  Patient presents with  . debride    BL nails care -long and thick   . redness    BL feet redeness and skin peeling after soaking in epsom satl x 4 mo; no pain/injury -pt states PCP recomended epsom salt for bacteria in feet -w/ feet dryness and sweling   . Foot Swelling    BL feet swelling (Lt>Rt) x 3-4 mo; pt dneis redness/swelign -unknown how it started Tx: Pt (for posture)     86 y.o. female presents with the above complaint. History confirmed with patient.  Objective:  Physical Exam: warm, good capillary refill, nail exam onychomycosis of the toenails, no trophic changes or ulcerative lesions. DP pulses palpable, PT pulses palpable and protective sensation intact. Poor toe circulation with Raynaud's Venous insuff bilat with pitting edema, thin shiny skin Xerosis with scaling and pruritic plaque moccassin distribution   No images are attached to the encounter.  Assessment:   1. Foot swelling   2. Raynaud's disease without gangrene   3. Onychomycosis   4. Tinea pedis of both feet   5. Venous (peripheral) insufficiency      Plan:  Patient was evaluated and treated and all questions answered.  Onychomycosis, Raynauds -Nails palliatively debrided secondary to raynaud's  Procedure: Nail Debridement Rationale: Patient meets criteria for routine foot care due to raynaud's dz/ Type of Debridement: manual, sharp debridement. Instrumentation: Nail nipper, rotary burr. Number of Nails: 10  Venous Insufficiency -XR reviewed no underlying likely cause of swelling. Some arthritic changes. -Recommend PCP f/u for possible diuretic  Tinea Pedis -Hold off steroid cream at this time. -Rx Clotrimazole 2x daily. D/c clotrimazole/betamethasone -Continue antifungal powder to shoes.   No follow-ups on file.

## 2019-06-22 ENCOUNTER — Other Ambulatory Visit: Payer: Self-pay | Admitting: Podiatry

## 2019-06-22 DIAGNOSIS — I7300 Raynaud's syndrome without gangrene: Secondary | ICD-10-CM

## 2019-06-22 DIAGNOSIS — M7989 Other specified soft tissue disorders: Secondary | ICD-10-CM

## 2019-07-06 DIAGNOSIS — B370 Candidal stomatitis: Secondary | ICD-10-CM | POA: Insufficient documentation

## 2019-07-06 DIAGNOSIS — B3781 Candidal esophagitis: Secondary | ICD-10-CM | POA: Insufficient documentation

## 2019-07-10 ENCOUNTER — Other Ambulatory Visit: Payer: Self-pay

## 2019-07-10 ENCOUNTER — Ambulatory Visit (INDEPENDENT_AMBULATORY_CARE_PROVIDER_SITE_OTHER): Payer: Medicare Other | Admitting: Podiatry

## 2019-07-10 DIAGNOSIS — L309 Dermatitis, unspecified: Secondary | ICD-10-CM | POA: Diagnosis not present

## 2019-07-10 DIAGNOSIS — B353 Tinea pedis: Secondary | ICD-10-CM

## 2019-07-10 MED ORDER — TRIAMCINOLONE ACETONIDE 0.1 % EX CREA
TOPICAL_CREAM | CUTANEOUS | 0 refills | Status: DC
Start: 2019-07-10 — End: 2019-08-30

## 2019-07-10 MED ORDER — CLOTRIMAZOLE 1 % EX CREA: 1.0000 "application " | TOPICAL_CREAM | Freq: Every day | CUTANEOUS | 0 refills | Status: DC

## 2019-07-10 NOTE — Patient Instructions (Signed)
Morning: Use clotrimazole in morning on bottom of feet Apply triamcinolone only to itchy/red areas of the top/outside of the foot in the morning  Night: Apply aquaphor or other lotion to tops and bottom of feet but not between toes.

## 2019-07-15 NOTE — Progress Notes (Signed)
  Subjective:  Patient ID: Lacey Stanton, female    DOB: 07-30-1926,  MRN: [id number]  Chief Complaint  Patient presents with  . Foot Swelling    F/U BL swelling Pt. states,' my Dr. told me swelling is coming from my lungs." -dx with COPD 2 wks ago by PCP Tx: none - pt states swelling is about the same  . Tinea Pedis    FBS: tinea pedis -pt states," it's better," -w/ some burning at Rt lateral side tx; clotirmaozle and antigunal powder    86 y.o. female presents with the above complaint. History confirmed with patient.  Objective:  Physical Exam: warm, good capillary refill, nail exam onychomycosis of the toenails, no trophic changes or ulcerative lesions. DP pulses palpable, PT pulses palpable and protective sensation intact. Poor toe circulation with Raynaud's Venous insuff bilat with pitting edema, thin shiny skin Xerosis with scaling and pruritic plaque moccassin distribution, improved since prior. No images are attached to the encounter.  Assessment:   1. Tinea pedis of both feet   2. Dermatitis      Plan:  Patient was evaluated and treated and all questions answered.  Tinea Pedis -Use clotrimazole in morning on bottom of feet -Apply triamcinolone only to itchy/red areas of the top/outside of the foot in the morning. PM apply aquaphor or other lotion to tops and bottom of feet but not between toes. -Rx clotrimazole and triamcinolone   Return in about 6 weeks (around 08/21/2019).

## 2019-07-21 DIAGNOSIS — J9621 Acute and chronic respiratory failure with hypoxia: Secondary | ICD-10-CM

## 2019-07-21 DIAGNOSIS — E039 Hypothyroidism, unspecified: Secondary | ICD-10-CM | POA: Diagnosis not present

## 2019-07-21 DIAGNOSIS — J440 Chronic obstructive pulmonary disease with acute lower respiratory infection: Secondary | ICD-10-CM

## 2019-07-21 DIAGNOSIS — R509 Fever, unspecified: Secondary | ICD-10-CM | POA: Diagnosis not present

## 2019-07-22 DIAGNOSIS — R509 Fever, unspecified: Secondary | ICD-10-CM | POA: Diagnosis not present

## 2019-07-22 DIAGNOSIS — E039 Hypothyroidism, unspecified: Secondary | ICD-10-CM | POA: Diagnosis not present

## 2019-07-22 DIAGNOSIS — J9621 Acute and chronic respiratory failure with hypoxia: Secondary | ICD-10-CM | POA: Diagnosis not present

## 2019-07-22 DIAGNOSIS — J440 Chronic obstructive pulmonary disease with acute lower respiratory infection: Secondary | ICD-10-CM | POA: Diagnosis not present

## 2019-07-23 DIAGNOSIS — E039 Hypothyroidism, unspecified: Secondary | ICD-10-CM | POA: Diagnosis not present

## 2019-07-23 DIAGNOSIS — R509 Fever, unspecified: Secondary | ICD-10-CM | POA: Diagnosis not present

## 2019-07-23 DIAGNOSIS — J440 Chronic obstructive pulmonary disease with acute lower respiratory infection: Secondary | ICD-10-CM | POA: Diagnosis not present

## 2019-07-23 DIAGNOSIS — J9621 Acute and chronic respiratory failure with hypoxia: Secondary | ICD-10-CM | POA: Diagnosis not present

## 2019-07-27 ENCOUNTER — Telehealth: Payer: Self-pay | Admitting: Internal Medicine

## 2019-07-27 DIAGNOSIS — R918 Other nonspecific abnormal finding of lung field: Secondary | ICD-10-CM | POA: Insufficient documentation

## 2019-07-27 DIAGNOSIS — M21371 Foot drop, right foot: Secondary | ICD-10-CM | POA: Insufficient documentation

## 2019-07-27 NOTE — Telephone Encounter (Signed)
Spoke with the pt's daughter She states that the pt had PNA recently  Was seen by her PCP today and they rec that she f/u with Dr Sherene Sires soon  Appt with TP for 08/13/19

## 2019-08-09 ENCOUNTER — Encounter: Payer: Self-pay | Admitting: Internal Medicine

## 2019-08-09 ENCOUNTER — Other Ambulatory Visit: Payer: Self-pay

## 2019-08-09 ENCOUNTER — Ambulatory Visit (INDEPENDENT_AMBULATORY_CARE_PROVIDER_SITE_OTHER): Payer: Medicare Other | Admitting: Internal Medicine

## 2019-08-09 VITALS — BP 108/62 | HR 84 | Temp 98.3°F | Ht 60.0 in | Wt 106.8 lb

## 2019-08-09 DIAGNOSIS — J479 Bronchiectasis, uncomplicated: Secondary | ICD-10-CM | POA: Diagnosis not present

## 2019-08-09 DIAGNOSIS — J9611 Chronic respiratory failure with hypoxia: Secondary | ICD-10-CM | POA: Diagnosis not present

## 2019-08-09 NOTE — Progress Notes (Signed)
Subjective:    Patient ID: Lacey Stanton, female    DOB: Feb 14, 1926   MRN: [id number]   Brief patient profile:  83  yowf never smoker with obstructive  bronchiectasis with resp symptoms  of cough/sob dating back to  the 1980's  > M History of Present Illness  06/29/2012 f/u ov/Celise Bazar re bronchiectasis on 02 just at hs and bud/brovana bid maint rx Chief Complaint  Patient presents with  . Follow-up    Pt c/o increased SOB for the past month. She feels that nebs are not lasting long enough any longer.   at baseline maintaining brovana budesonide and rarely needing saba hfa Then one month pta fever no cough but sob and increased proaire and used 02  > ov with Dr Martha Clan rx Avelox and fever resolved but not breathing to her satisfaction and using proaire 2 x daily but no neb saba. Doe x > slow adls rec increase zantac (ranitidine) to 150 mg after bfast and after supper  GERD  diet Continue to use the nebulizer brovana and budesonide and then 12 hours later. Prednisone 10 mg take  4 each am x 2 days,   2 each am x 2 days,  1 each am x2days and stop  Work on inhaler technique    Only use your albuterol (proaire) as rescue  02/06/2013 f/u ov/Jameka Ivie re: bronchiectasis on maint brovana/ bud plus prn saba hfa Chief Complaint  Patient presents with  . Follow-up    Pt states doing well and denies any new co's today.   typically more congested after lunch and feels her self wheezing so uses albuterol but not really helping much and  not using mucinex much  At all nor the flutter. Not limited by breathing though from desired activities rec Pantoprazole (protonix) 40 mg   Take 30-60 min before first meal of the day and Zantac 150  mg one @ bedtime until return to office GERD diet For cough > flutter valve and mucinex or mucinex dm up 1200 mg every 12hours  Relax and do purse lip breath breathing as much as possible.     10/18/2017  f/u ov/Forever Arechiga re: bronchiectasis Chief Complaint  Patient presents  with  . Follow-up    Breathing is "okay". She states still coughing up yellow sputum.  She is using her albuterol inhaler 2 x per wk.   Dyspnea:  MMRC2 = can't walk a nl pace on a flat grade s sob but does fine slow and flat / slowed by coughing Cough: minimal yellow esp am  Sleeping: ok on back 2 pillows / bed flat  SABA use: twice a week 02 2lpm hs / rarely uses during the day  rec Plan A = Automatic =  Brovana / Budesonide twice daily  Plan B = Backup for breathing/ congestion Only use your albuterol as a rescue medication Plan C = Crisis - only use your albuterol nebulizer if you first try Plan B and it fails to help > ok to use the nebulizer up to every 4 hours but if start needing it regularly call for immediate appointment For cough / congestion > mucinex dm up to 1200 mg every 12 hours and use the flutter valve as much as possible  Prednisone 10 mg take  4 each am x 2 days,   2 each am x 2 days,  1 each am x 2 days and stop       03/03/2018  f/u ov/Deklen Popelka re: obstructive bronchiectasis  Chief Complaint  Patient presents with  . Follow-up    Pepcid not working, still having colored mucus, oxygen levels dropping even at rest, on oxygen at night, SOB  Dyspnea: 2 wheeled walker / mailbox 50 ft flat is her limit = MMRC3 = can't walk 100 yards even at a slow pace at a flat grade s stopping due to sob   Cough: about the same/ cipro really didn't help but can't tol avelox and pcn allergic per records/ not using flutter or mucinex as rec  Sleeping: bed flat/ 3 pillows SABA use: 3-4 x per week 02: 2lpm hs rec  Plan A = Automatic =  Brovana / Budesonide 1st thing in am and 12 hours later Plan B = Backup for breathing/ congestion Only use your albuterol as a rescue medication Work on inhaler technique:   Plan C = Crisis - only use your albuterol nebulizer if you first try Plan B  For cough / congestion > mucinex dm up to 1200 mg every 12 hours and use the flutter valve as much as  possible  Prednisone 10 mg take  4 each am x 2 days,   2 each am x 2 days,  1 each am x 2 days and stop  Zpak   Sputum culture 03/31/18  E coli > rx omnicef x 7 days > much better     08/09/2018  f/u ov/Karo Rog re: bronchiectasis on brovana/bud bid  Confused with details of care, did not bring new med calendar but says "best she's been in a while"  Chief Complaint  Patient presents with  . Other    Bronchiectasis - patient has been doing well since last visit.Medications have been working.  Dyspnea:  Walk in hallways at home and mailbox with a cane or walker  Cough: some in am dark but white  Sleeping:  1- 2 pillows but bed is flat  SABA use: not needing  02: 2lpm hs and famotidine but still with breakthru sense of hb/ "Chest congestion " at hs rec Pepcid hs   09/27/2018 televisit Omnicef 300 mg twice daily for 7 days as needed for nasty mucus - add this to your action plan at bottom of your med calendar  If 02 sats drops from mid 90s at rest on room air you will need to be evaluated in ER  We can't assume this is not COVID -19 so you need to isolate for the next 13 days and any contact will need to isolate with the other option being you get tested now or they get tested in 5 days but if any of your contacts get sick in the next week I would assume everyone has COVID as it's too soon for flu season.  See calendar for specific medication instructions     02/13/2019  f/u ov/Trinten Boudoin re: bronchiectasis  Chief Complaint  Patient presents with  . Follow-up    Breathing is overall doing well. She is coughing more at night- producing light yellow to clear sputum.   Dyspnea:  Cane at home mb and back slow pace does ok  Cough: at hs / minimally discolored, much better p omnicef x 7d  SABA use: none needed 02: 2lpm  Hs but not needed during the day  rec For night time cough take either mucinex dm or your over the counter   Chlorpheniramine 4 mg at bedime  Take the covid vaccine as soon as it is  offered> J&J April 04 2019  08/09/2019  f/u ov/Letoya Stallone re: obstructive bronchiectasison brovana/bud bid  [facility] 48h Chief Complaint  Patient presents with  . Follow-up    shortness of breath with exertion  Dyspnea:  Walking around appt complex  Cough: minimal clear  Sleeping: able to sleep ok bed blocks/ 2 pillows  SABA use once a month 02: 2lpm HS    No obvious day to day or daytime variability or assoc excess/ purulent sputum or mucus plugs or hemoptysis or cp or chest tightness, subjective wheeze or overt sinus or hb symptoms.   Sleeping  without nocturnal  or early am exacerbation  of respiratory  c/o's or need for noct saba. Also denies any obvious fluctuation of symptoms with weather or environmental changes or other aggravating or alleviating factors except as outlined above   No unusual exposure hx or h/o childhood pna/ asthma or knowledge of premature birth.  Current Allergies, Complete Past Medical History, Past Surgical History, Family History, and Social History were reviewed in [facility] electronic medical record.  ROS  The following are not active complaints unless bolded Hoarseness, sore throat, dysphagia, dental problems, itching, sneezing,  nasal congestion or discharge of excess mucus or purulent secretions, ear ache,   fever, chills, sweats, unintended wt loss or wt gain, classically pleuritic or exertional cp,  orthopnea pnd or arm/hand swelling  or leg swelling, presyncope, palpitations, abdominal pain, anorexia, nausea, vomiting, diarrhea  or change in bowel habits or change in bladder habits, change in stools or change in urine, dysuria, hematuria,  rash, arthralgias, visual complaints, headache, numbness, weakness or ataxia or problems with walking or coordination,  change in mood or  memory.        Current Meds  Medication Sig  . BIOTIN PO Take 1,000 mg by mouth daily.  Marland Kitchen. BROVANA 15 MCG/2ML NEBU USE 1 VIAL  IN  NEBULIZER TWICE   DAILY - morning and evening  . budesonide (PULMICORT) 0.5 MG/2ML nebulizer solution USE 1 VIAL  IN  NEBULIZER TWICE  DAILY - rinse mouth after treatment  . Cholecalciferol (VITAMIN D-3) 5000 units TABS Take 1 tablet by mouth daily.  . clotrimazole (CLOTRIMAZOLE ANTI-FUNGAL) 1 % cream Apply 1 application topically daily.  . famotidine (PEPCID) 20 MG tablet TAKE ONE TABLET BY MOUTH EVERY DAY AT BEDTIME  . levothyroxine (SYNTHROID, LEVOTHROID) 50 MCG tablet Take by mouth daily.    . Magnesium 250 MG TABS Take 1 tablet by mouth daily.  . Melatonin 10 MG TABS Take 1 tablet by mouth at bedtime.  . OXYGEN 2 lpm with sleep and as needed during the day  . pantoprazole (PROTONIX) 40 MG tablet Take 1 tablet (40 mg total) by mouth daily. Take 30-60 min before first meal of the day  . Prenatal Vit-Fe Sulfate-FA (PRENATAL MULTIVIT-IRON PO) Take 1 tablet by mouth daily.  . Probiotic CAPS Take 1 capsule by mouth daily.  Marland Kitchen. Respiratory Therapy Supplies (FLUTTER) DEVI Use as directed  . triamcinolone cream (KENALOG) 0.1 % Apply 1 fingertip amount to itching/red areas once daily.  . verapamil (CALAN-SR) 120 MG CR tablet Take 120 mg by mouth at bedtime.            .              Past Medical History:  Bronchiectasis  - CT 11/07/08: Bronchiectasis in the lingular > RML plus small HH  - Sinus CT neg 04/17/09  - HFA 25 > 75% effective April 15, 2009  -PFT's 09/16/2010   FEV1  .89( 55%)  Ratio 49  No better with B2,  DLCO 62 > improved to 107 Pneumonia > cleared radiographically 07/01/2010     - Pneumovax March 2011  Allergic Rhinitis  Pulmonary hemorrhage 1979   Osteoarthritis      - s/p  L TKR 02/2000 >>  Difficult recovery Hypothyroidism  GERD diet       Objective:   Physical Exam   amb wf nad     08/09/2019   106   03/03/2018    115  08/09/2018    115  Wt 125 08/03/2010  > 122 09/16/2010  > 128 12/09/2010 > 03/15/2011  133> [id number]/54627  129> 11/08/2012 131 >  10/22/2014   129 > 02/10/2015  113 > 07/28/2015  107 > 02/29/2016  113 > 07/02/2016 112 > 10/05/2016  107 > 04/06/2017   118 > 07/12/2017   117 > 10/18/2017  113 > 12/05/2017 113 >   Vital signs reviewed  08/09/2019  - Note at rest 02 sats  94% on RA         Reports full dentures    HEENT : pt wearing mask not removed for exam due to covid -19 concerns.    NECK :  without JVD/Nodes/TM/ nl carotid upstrokes bilaterally   LUNGS: no acc muscle use,mild mod kyphotic chest wall, min insp/exp  bilaterally without cough on insp or exp maneuvers   CV:  RRR  no s3 or murmur or increase in P2, and no edema   ABD:  soft and nontender with nl inspiratory excursion in the supine position. No bruits or organomegaly appreciated, bowel sounds nl  MS:  Nl gait/ ext warm without deformities, calf tenderness, cyanosis or clubbing No obvious joint restrictions   SKIN: warm and dry without lesions    NEURO:  alert, approp, nl sensorium with R foot drop     I personally reviewed images and agree with radiology impression as follows:   Chest CT 07/21/19 1. Lungs demonstrate areas of bronchiectasis associated with linear/reticular and patchy opacities, latter finding most consistent with scarring/atelectasis. There also additional peripheral reticulonodular opacities. Consider atypical infection such as MAI in the proper clinical setting. There is also mild bilateral interstitial thickening, which is chronic and similar to the prior chest CT. No pulmonary edema. 2. Severe compression deformity of T6, new since the prior CT but of unclear chronicity, most likely chronic. Mild chronic compression fractures of T3, T12 and L2.        Assessment & Plan:

## 2019-08-09 NOTE — Patient Instructions (Signed)
For nasty mucus > take a full course of the omnicef    Please schedule a follow up office visit in 6 weeks, call sooner if needed with cxr

## 2019-08-11 ENCOUNTER — Encounter: Payer: Self-pay | Admitting: Internal Medicine

## 2019-08-11 NOTE — Assessment & Plan Note (Signed)
03/28/13 Qualification  Patient Saturations on Room Air at Rest = 93% Patient Saturations on [employer] while Ambulating = 88% Patient Saturations on 2 Liters of oxygen while Ambulating = 97% - 10/22/2014  Walked RA x 2 laps @ 185 ft each stopped due sob at slow pace but sats still 90%   - 07/28/2015  Walked RA x 3 laps @ 185 ft each stopped due to  End of study, nl pace, no sob or desat     sats fine on room air and very sedentary now.   Continue 2lpm hs and prn          Each maintenance medication was reviewed in detail including emphasizing most importantly the difference between maintenance and prns and under what circumstances the prns are to be triggered using an action plan format where appropriate.  Total time for H and P, chart review, counseling, reviewing devices and generating customized AVS unique to this office visit / charting = 20 min

## 2019-08-11 NOTE — Assessment & Plan Note (Addendum)
Onset of symptoms 1980s - CT 11/07/08: Bronchiectasis in the lingula  > rml plus small HH  - Sinus CT neg 04/17/09   -PFT's 09/16/2010   FEV1  .89( 55%)  Ratio 49  No better with B2,  DLCO 62 > improved to 107 - PFTs  08/10/2012    FEV1  0.78 (50%) ratio 58,  No better p B2  DLCO 55 improved to 86% - Alpha one AT phenotype 07/07/10: MM CT chest>   03/16/13 bronchiectasis with nodules - Spirometry 02/10/2015  FEV1  0.54 (33%) ratio 53   - PFT's  07/28/2015  FEV1 0.82 (56 % ) ratio 57  p 3 % improvement from saba p laba/ics neb  prior to study with DLCO  48/53 % corrects to 83 % for alv volume   - 07/02/16 rec doxy prn flares  - PFT's  07/12/2017  FEV1 0.65 (48 % ) ratio 59  p no % improvement from saba p nothing prior to study with DLCO  45 % corrects to 75 % for alv volume   - 07/12/17 Changed prn abx to cipro 750 bid x 10 days as doxy no longer effective  - 03/03/2018  After extensive coaching inhaler device,  effectiveness =    75%  (short Ti) - 09/27/2018 changed prn abx to omnicef 300 mg bid x 7 days based on previous repsonse and sputum growing e coli 03/31/2018     Ct suggests may also have MAI but so far flares respond to omnicef and as long as that is the case no need to pursue this further but just treat clinically as obst bronchiectasis with bud/formoterol neb bid   NB :  MAI  is an extremely common benign condition in the elderly and does not warrant aggressive eval/ rx at this point unless there is a clinical correlation suggesting unaddressed pulmonary infection (purulent sputum, night sweats, unintended wt loss, doe) or evolution of  obvious changes on plain cxr (as opposed to serial CT, which is way over sensitive to make clinical decisions re intervention and treatment in the elderly, who tend to tolerate both dx and treatment poorly) .   >>> f/u cxr in 6 weeks

## 2019-08-13 ENCOUNTER — Ambulatory Visit: Payer: Medicare Other | Admitting: Adult Health

## 2019-08-13 ENCOUNTER — Ambulatory Visit: Payer: Medicare Other | Admitting: Internal Medicine

## 2019-08-29 ENCOUNTER — Ambulatory Visit: Payer: Medicare Other | Admitting: Internal Medicine

## 2019-08-30 ENCOUNTER — Other Ambulatory Visit: Payer: Self-pay | Admitting: Podiatry

## 2019-08-31 ENCOUNTER — Ambulatory Visit: Payer: Medicare Other | Admitting: Internal Medicine

## 2019-09-06 ENCOUNTER — Ambulatory Visit (INDEPENDENT_AMBULATORY_CARE_PROVIDER_SITE_OTHER): Payer: Medicare Other | Admitting: Podiatry

## 2019-09-06 ENCOUNTER — Other Ambulatory Visit: Payer: Self-pay

## 2019-09-06 DIAGNOSIS — I872 Venous insufficiency (chronic) (peripheral): Secondary | ICD-10-CM | POA: Diagnosis not present

## 2019-09-06 DIAGNOSIS — B351 Tinea unguium: Secondary | ICD-10-CM | POA: Diagnosis not present

## 2019-09-06 DIAGNOSIS — B353 Tinea pedis: Secondary | ICD-10-CM

## 2019-09-06 DIAGNOSIS — L309 Dermatitis, unspecified: Secondary | ICD-10-CM | POA: Diagnosis not present

## 2019-09-06 NOTE — Progress Notes (Signed)
  Subjective:  Patient ID: Lacey Stanton, female    DOB: Sep 08, 1926,  MRN: [id number]  Chief Complaint  Patient presents with  . debride    RFC   . Tinea Pedis    F/U BL tinea pedis Pt states," doing much better, with a few spots still left." -Tx: clotrimazole and triamcinolone   86 y.o. female presents with the above complaint. History confirmed with patient.  Objective:  Physical Exam: warm, good capillary refill, nail exam onychomycosis of the toenails, no trophic changes or ulcerative lesions. DP pulses palpable, PT pulses palpable and protective sensation intact. Poor toe circulation with Raynaud's Venous insuff bilat with pitting edema, thin shiny skin Xerosis with scaling and pruritic plaque moccassin distribution, improved since prior. Foot drop right, right posterior ankle abrasion No images are attached to the encounter.  Assessment:   1. Tinea pedis of both feet   2. Dermatitis   3. Onychomycosis   4. Venous (peripheral) insufficiency      Plan:  Patient was evaluated and treated and all questions answered.  Tinea Pedis -Improved  Onychomycosis -Nails trimmed x10. ABN signed and on file. Copy given to patient.  Foot Drop -Brace offloaded with moleskin for comfort and to prevent friction and further wound formation   No follow-ups on file.

## 2019-09-27 ENCOUNTER — Other Ambulatory Visit: Payer: Self-pay

## 2019-09-27 ENCOUNTER — Ambulatory Visit (INDEPENDENT_AMBULATORY_CARE_PROVIDER_SITE_OTHER): Payer: Medicare Other

## 2019-09-27 ENCOUNTER — Encounter: Payer: Self-pay | Admitting: Internal Medicine

## 2019-09-27 ENCOUNTER — Ambulatory Visit (INDEPENDENT_AMBULATORY_CARE_PROVIDER_SITE_OTHER): Payer: Medicare Other | Admitting: Internal Medicine

## 2019-09-27 DIAGNOSIS — J479 Bronchiectasis, uncomplicated: Secondary | ICD-10-CM | POA: Diagnosis not present

## 2019-09-27 DIAGNOSIS — J9611 Chronic respiratory failure with hypoxia: Secondary | ICD-10-CM | POA: Diagnosis not present

## 2019-09-27 DIAGNOSIS — I872 Venous insufficiency (chronic) (peripheral): Secondary | ICD-10-CM | POA: Diagnosis not present

## 2019-09-27 NOTE — Progress Notes (Signed)
Subjective:    Patient ID: Lacey Stanton, female    DOB: 01-04-1927   MRN: [id number]   Brief patient profile:  27  yowf never smoker with obstructive  bronchiectasis with resp symptoms  of cough/sob dating back to  the 1980's   History of Present Illness  06/29/2012 f/u ov/Aniya Jolicoeur re bronchiectasis on 02 just at hs and bud/brovana bid maint rx Chief Complaint  Patient presents with  . Follow-up    Pt c/o increased SOB for the past month. She feels that nebs are not lasting long enough any longer.   at baseline maintaining brovana budesonide and rarely needing saba hfa Then one month pta fever no cough but sob and increased proaire and used 02  > ov with Dr Martha Clan rx Avelox and fever resolved but not breathing to her satisfaction and using proaire 2 x daily but no neb saba. Doe x > slow adls rec increase zantac (ranitidine) to 150 mg after bfast and after supper  GERD  diet Continue to use the nebulizer brovana and budesonide and then 12 hours later. Prednisone 10 mg take  4 each am x 2 days,   2 each am x 2 days,  1 each am x2days and stop  Work on inhaler technique    Only use your albuterol (proaire) as rescue  02/06/2013 f/u ov/Syona Wroblewski re: bronchiectasis on maint brovana/ bud plus prn saba hfa Chief Complaint  Patient presents with  . Follow-up    Pt states doing well and denies any new co's today.   typically more congested after lunch and feels her self wheezing so uses albuterol but not really helping much and  not using mucinex much  At all nor the flutter. Not limited by breathing though from desired activities rec Pantoprazole (protonix) 40 mg   Take 30-60 min before first meal of the day and Zantac 150  mg one @ bedtime until return to office GERD diet For cough > flutter valve and mucinex or mucinex dm up 1200 mg every 12hours  Relax and do purse lip breath breathing as much as possible.     10/18/2017  f/u ov/Emmah Bratcher re: bronchiectasis Chief Complaint  Patient presents with   . Follow-up    Breathing is "okay". She states still coughing up yellow sputum.  She is using her albuterol inhaler 2 x per wk.   Dyspnea:  MMRC2 = can't walk a nl pace on a flat grade s sob but does fine slow and flat / slowed by coughing Cough: minimal yellow esp am  Sleeping: ok on back 2 pillows / bed flat  SABA use: twice a week 02 2lpm hs / rarely uses during the day  rec Plan A = Automatic =  Brovana / Budesonide twice daily  Plan B = Backup for breathing/ congestion Only use your albuterol as a rescue medication Plan C = Crisis - only use your albuterol nebulizer if you first try Plan B and it fails to help > ok to use the nebulizer up to every 4 hours but if start needing it regularly call for immediate appointment For cough / congestion > mucinex dm up to 1200 mg every 12 hours and use the flutter valve as much as possible  Prednisone 10 mg take  4 each am x 2 days,   2 each am x 2 days,  1 each am x 2 days and stop       03/03/2018  f/u ov/Nichollas Perusse re: obstructive bronchiectasis  Chief  Complaint  Patient presents with  . Follow-up    Pepcid not working, still having colored mucus, oxygen levels dropping even at rest, on oxygen at night, SOB  Dyspnea: 2 wheeled walker / mailbox 50 ft flat is her limit = MMRC3 = can't walk 100 yards even at a slow pace at a flat grade s stopping due to sob   Cough: about the same/ cipro really didn't help but can't tol avelox and pcn allergic per records/ not using flutter or mucinex as rec  Sleeping: bed flat/ 3 pillows SABA use: 3-4 x per week 02: 2lpm hs rec  Plan A = Automatic =  Brovana / Budesonide 1st thing in am and 12 hours later Plan B = Backup for breathing/ congestion Only use your albuterol as a rescue medication Work on inhaler technique:   Plan C = Crisis - only use your albuterol nebulizer if you first try Plan B  For cough / congestion > mucinex dm up to 1200 mg every 12 hours and use the flutter valve as much as possible   Prednisone 10 mg take  4 each am x 2 days,   2 each am x 2 days,  1 each am x 2 days and stop  Zpak   Sputum culture 03/31/18  E coli > rx omnicef x 7 days > much better     08/09/2018  f/u ov/Tawana Pasch re: bronchiectasis on brovana/bud bid  Confused with details of care, did not bring new med calendar but says "best she's been in a while"  Chief Complaint  Patient presents with  . Other    Bronchiectasis - patient has been doing well since last visit.Medications have been working.  Dyspnea:  Walk in hallways at home and mailbox with a cane or walker  Cough: some in am dark but white  Sleeping:  1- 2 pillows but bed is flat  SABA use: not needing  02: 2lpm hs and famotidine but still with breakthru sense of hb/ "Chest congestion " at hs rec Pepcid hs   09/27/2018 televisit Omnicef 300 mg twice daily for 7 days as needed for nasty mucus - add this to your action plan at bottom of your med calendar  If 02 sats drops from mid 90s at rest on room air you will need to be evaluated in ER  We can't assume this is not COVID -19 so you need to isolate for the next 13 days and any contact will need to isolate with the other option being you get tested now or they get tested in 5 days but if any of your contacts get sick in the next week I would assume everyone has COVID as it's too soon for flu season.  See calendar for specific medication instructions     02/13/2019  f/u ov/Kemari Mares re: bronchiectasis  Chief Complaint  Patient presents with  . Follow-up    Breathing is overall doing well. She is coughing more at night- producing light yellow to clear sputum.   Dyspnea:  Cane at home mb and back slow pace does ok  Cough: at hs / minimally discolored, much better p omnicef x 7d  SABA use: none needed 02: 2lpm  Hs but not needed during the day  rec For night time cough take either mucinex dm or your over the counter   Chlorpheniramine 4 mg at bedime  Take the covid vaccine as soon as it is offered>  J&J April 04 2019  08/09/2019  f/u ov/Gatlin Kittell re: obstructive bronchiectasison brovana/bud bid  [facility] June 26 21 x 48h Chief Complaint  Patient presents with  . Follow-up    shortness of breath with exertion  Dyspnea:  Walking around appt complex  Cough: minimal clear  Sleeping: able to sleep ok bed blocks/ 2 pillows  SABA use once a month 02: 2lpm HS  rec Take omnicef if needed    09/27/2019  f/u ov/Patti Shorb re: obstructive bronchiectasis/ brova bud no recent omnicef needed  Chief Complaint  Patient presents with  . Follow-up  Dyspnea:  Not going outside now, walking around appt with walker with sats 91% and breathing better  Cough: yellow in ams then clears  Sleeping: bed block / 2 pillows  SABA use: rarely  02: 2lpm hs  L > R swelling not resolving overnight > echo done and ok    No obvious day to day or daytime variability or assoc excess/ purulent sputum or mucus plugs or hemoptysis or cp or chest tightness, subjective wheeze or overt sinus or hb symptoms.   sleeping without nocturnal   exacerbation  of respiratory  c/o's or need for noct saba. Also denies any obvious fluctuation of symptoms with weather or environmental changes or other aggravating or alleviating factors except as outlined above   No unusual exposure hx or h/o childhood pna/ asthma or knowledge of premature birth.  Current Allergies, Complete Past Medical History, Past Surgical History, Family History, and Social History were reviewed in [facility] electronic medical record.  ROS  The following are not active complaints unless bolded Hoarseness, sore throat, dysphagia, dental problems, itching, sneezing,  nasal congestion or discharge of excess mucus or purulent secretions, ear ache,   fever, chills, sweats, unintended wt loss or wt gain, classically pleuritic or exertional cp,  orthopnea pnd or arm/hand swelling  or leg swelling, presyncope, palpitations, abdominal pain, anorexia, nausea,  vomiting, diarrhea  or change in bowel habits or change in bladder habits, change in stools or change in urine, dysuria, hematuria,  rash, arthralgias, visual complaints, headache, numbness, weakness or ataxia or problems with walking or coordination,  change in mood or  memory.        Current Meds  Medication Sig  . BIOTIN PO Take 1,000 mg by mouth daily.  Marland Kitchen. BROVANA 15 MCG/2ML NEBU USE 1 VIAL  IN  NEBULIZER TWICE  DAILY - morning and evening  . budesonide (PULMICORT) 0.5 MG/2ML nebulizer solution USE 1 VIAL  IN  NEBULIZER TWICE  DAILY - rinse mouth after treatment  . Cholecalciferol (VITAMIN D-3) 5000 units TABS Take 1 tablet by mouth daily.  . clotrimazole (CLOTRIMAZOLE ANTI-FUNGAL) 1 % cream Apply 1 application topically daily.  . famotidine (PEPCID) 20 MG tablet TAKE ONE TABLET BY MOUTH EVERY DAY AT BEDTIME  . fluconazole (DIFLUCAN) 100 MG tablet take 2 pills on day 1, then 1 pill daily x 2 weeks  . levothyroxine (SYNTHROID, LEVOTHROID) 50 MCG tablet Take by mouth daily.    . Magnesium 250 MG TABS Take 1 tablet by mouth daily.  . Melatonin 10 MG TABS Take 1 tablet by mouth at bedtime.  . OXYGEN 2 lpm with sleep and as needed during the day  . pantoprazole (PROTONIX) 40 MG tablet Take 1 tablet (40 mg total) by mouth daily. Take 30-60 min before first meal of the day  . Prenatal Vit-Fe Sulfate-FA (PRENATAL MULTIVIT-IRON PO) Take 1 tablet by mouth daily.  . Probiotic CAPS Take 1 capsule  by mouth daily.  Marland Kitchen Respiratory Therapy Supplies (FLUTTER) DEVI Use as directed  . triamcinolone cream (KENALOG) 0.1 % Apply 1 fingertip amount to itching/red areas once daily.  . verapamil (CALAN-SR) 120 MG CR tablet Take 120 mg by mouth at bedtime.  . zoledronic acid (RECLAST) 5 MG/100ML SOLN injection Inject into the vein.               Past Medical History:  Bronchiectasis  - CT 11/07/08: Bronchiectasis in the lingular > RML plus small HH  - Sinus CT neg 04/17/09  - HFA 25 > 75% effective April 15, 2009  -PFT's 09/16/2010   FEV1  .89( 55%)  Ratio 49  No better with B2,  DLCO 62 > improved to 107 Pneumonia > cleared radiographically 07/01/2010     - Pneumovax March 2011  Allergic Rhinitis  Pulmonary hemorrhage 1979   Osteoarthritis      - s/p  L TKR 02/2000 >>  Difficult recovery Hypothyroidism  GERD diet       Objective:   Physical Exam     09/27/2019    106   08/09/2019   106   03/03/2018    115  08/09/2018    115  12/05/2017 113   Vital signs reviewed  09/27/2019  - Note at rest 02 sats  94% on RA    Frail w/c bound elderly wf nad  Reports full dentures   HEENT : pt wearing mask not removed for exam due to covid -19 concerns.    NECK :  without JVD/Nodes/TM/ nl carotid upstrokes bilaterally   LUNGS: no acc muscle use,  Mod kyphotic contour chest with insp pops both bases  without cough on insp or exp maneuvers   CV:  RRR  no s3 or murmur or increase in P2, and  1-2+ pitting both edema   ABD:  soft and nontender with nl inspiratory excursion in the supine position. No bruits or organomegaly appreciated, bowel sounds nl  MS:   ext warm without deformities, calf tenderness, cyanosis or clubbing No obvious joint restrictions   SKIN: warm and dry without lesions    NEURO:  alert, approp, nl sensorium with R foot drop, wearing brace      CXR PA and Lateral:   09/27/2019 :    I personally reviewed images and agree with radiology impression as follows:    1. No active cardiopulmonary disease. 2. Stable bronchiectasis. 3. Stable compression deformities of T6, T12, and L2.         Assessment & Plan:

## 2019-09-27 NOTE — Patient Instructions (Addendum)
I recommend a diuretic called aldactazide 25-25 but will defer this to Dr Martha Clan    Please schedule a follow up visit in 12  months but call sooner if needed

## 2019-09-28 ENCOUNTER — Encounter: Payer: Self-pay | Admitting: Internal Medicine

## 2019-09-28 NOTE — Assessment & Plan Note (Signed)
Onset of symptoms 1980s - CT 11/07/08: Bronchiectasis in the lingula  > rml plus small HH  - Sinus CT neg 04/17/09   -PFT's 09/16/2010   FEV1  .89( 55%)  Ratio 49  No better with B2,  DLCO 62 > improved to 107 - PFTs  08/10/2012    FEV1  0.78 (50%) ratio 58,  No better p B2  DLCO 55 improved to 86% - Alpha one AT phenotype 07/07/10: MM CT chest>   03/16/13 bronchiectasis with nodules - Spirometry 02/10/2015  FEV1  0.54 (33%) ratio 53   - PFT's  07/28/2015  FEV1 0.82 (56 % ) ratio 57  p 3 % improvement from saba p laba/ics neb  prior to study with DLCO  48/53 % corrects to 83 % for alv volume   - 07/02/16 rec doxy prn flares  - PFT's  07/12/2017  FEV1 0.65 (48 % ) ratio 59  p no % improvement from saba p nothing prior to study with DLCO  45 % corrects to 75 % for alv volume   - 07/12/17 Changed prn abx to cipro 750 bid x 10 days as doxy no longer effective  - 03/03/2018  After extensive coaching inhaler device,  effectiveness =    75%  (short Ti) - 09/27/2018 changed prn abx to omnicef 300 mg bid x 7 days based on previous repsonse and sputum growing e coli 03/31/2018      Well compensated, reviewed contingency rx for omnicef for exac but nothing else to offer at this point

## 2019-09-28 NOTE — Assessment & Plan Note (Signed)
03/28/13 Qualification  Patient Saturations on Room Air at Rest = 93% Patient Saturations on [employer] while Ambulating = 88% Patient Saturations on 2 Liters of oxygen while Ambulating = 97% - 10/22/2014  Walked RA x 2 laps @ 185 ft each stopped due sob at slow pace but sats still 90%   - 07/28/2015  Walked RA x 3 laps @ 185 ft each stopped due to  End of study, nl pace, no sob or desat    As of 09/27/2019  02 2lpm hs and prn    I suspect she might need more 02 if more active but no change in rx needed

## 2019-09-28 NOTE — Assessment & Plan Note (Signed)
With nl echo and improvement in edema overnight unless she has low albumin or renal insuff best rx in this setting is aldactazide / compression hose > defer to PCP          Each maintenance medication was reviewed in detail including emphasizing most importantly the difference between maintenance and prns and under what circumstances the prns are to be triggered using an action plan format where appropriate.  Total time for H and P, chart review, counseling  and generating customized AVS unique to this office visit / charting = 20 min

## 2019-09-28 NOTE — Progress Notes (Signed)
Spoke with pt and notified of results per Dr. Wert. Pt verbalized understanding and denied any questions. 

## 2019-10-22 ENCOUNTER — Other Ambulatory Visit: Payer: Self-pay | Admitting: Internal Medicine

## 2019-10-22 NOTE — Telephone Encounter (Signed)
Pt requesting refill cefdinir (OMNICEF) 300 MG capsule next appt 10/16/2020 with Dr Sherene Sires

## 2019-10-22 NOTE — Telephone Encounter (Signed)
Pt was informed ominicef 300mg  was sent to pharmacy with refills

## 2019-10-29 ENCOUNTER — Other Ambulatory Visit: Payer: Self-pay | Admitting: Internal Medicine

## 2019-10-29 DIAGNOSIS — R0609 Other forms of dyspnea: Secondary | ICD-10-CM

## 2019-11-14 ENCOUNTER — Other Ambulatory Visit: Payer: Self-pay | Admitting: Internal Medicine

## 2020-01-07 ENCOUNTER — Telehealth: Payer: Self-pay | Admitting: Internal Medicine

## 2020-01-07 DIAGNOSIS — J9611 Chronic respiratory failure with hypoxia: Secondary | ICD-10-CM

## 2020-01-07 NOTE — Telephone Encounter (Signed)
Called and spoke with pt who states her O2 sats have been dropping to 86-88 on room air with ambulation but they are in the 90s when at rest. Pt said when her sats drop to the 80s, she will put her O2 on to get saturations back stable. Pt states that she has been wearing O2 at night as directed. Stated to pt to continue to wear O2 as needed with exertion and she verbalized understanding. Nothing further needed. Assessment & Plan Note by Nyoka Cowden, MD at 09/28/2019 6:02 AM  Author: Nyoka Cowden, MD Author Type: Physician Filed: 09/28/2019 6:03 AM  Note Status: Written Cosign: Cosign Not Required Encounter Date: 09/27/2019  Problem: Chronic respiratory failure with hypoxia (HCC)  Editor: Nyoka Cowden, MD (Physician)             03/28/13 Qualification  Patient Saturations on Room Air at Rest = 93% Patient Saturations on Room Air while Ambulating = 88% Patient Saturations on 2 Liters of oxygen while Ambulating = 97% - 10/22/2014  Walked RA x 2 laps @ 185 ft each stopped due sob at slow pace but sats still 90%   - 07/28/2015  Walked RA x 3 laps @ 185 ft each stopped due to  End of study, nl pace, no sob or desat    As of 09/27/2019  02 2lpm hs and prn    I suspect she might need more 02 if more active but no change in rx needed        Assessment & Plan Note by Nyoka Cowden, MD at 09/28/2019 6:01 AM  Author: Nyoka Cowden, MD Author Type: Physician Filed: 09/28/2019 6:02 AM  Note Status: Written Cosign: Cosign Not Required Encounter Date: 09/27/2019  Problem: Obstructive bronchiectasis [facility])  Editor: Nyoka Cowden, MD (Physician)             Onset of symptoms 1980s - CT 11/07/08: Bronchiectasis in the lingula  > rml plus small HH  - Sinus CT neg 04/17/09   -PFT's 09/16/2010   FEV1  .89( 55%)  Ratio 49  No better with B2,  DLCO 62 > improved to 107 - PFTs  08/10/2012    FEV1  0.78 (50%) ratio 58,  No better p B2  DLCO 55 improved to 86% - Alpha one AT phenotype 07/07/10: MM CT chest>    03/16/13 bronchiectasis with nodules - Spirometry 02/10/2015  FEV1  0.54 (33%) ratio 53   - PFT's  07/28/2015  FEV1 0.82 (56 % ) ratio 57  p 3 % improvement from saba p laba/ics neb  prior to study with DLCO  48/53 % corrects to 83 % for alv volume   - 07/02/16 rec doxy prn flares  - PFT's  07/12/2017  FEV1 0.65 (48 % ) ratio 59  p no % improvement from saba p nothing prior to study with DLCO  45 % corrects to 75 % for alv volume   - 07/12/17 Changed prn abx to cipro 750 bid x 10 days as doxy no longer effective  - 03/03/2018  After extensive coaching inhaler device,  effectiveness =    75%  (short Ti) - 09/27/2018 changed prn abx to omnicef 300 mg bid x 7 days based on previous repsonse and sputum growing e coli 03/31/2018      Well compensated, reviewed contingency rx for omnicef for exac but nothing else to offer at this point       Patient Instructions by Sherene Sires,  Charlaine Dalton, MD at 09/27/2019 11:15 AM  Author: Nyoka Cowden, MD Author Type: Physician Filed: 09/27/2019 11:40 AM  Note Status: Addendum Cosign: Cosign Not Required Encounter Date: 09/27/2019  Editor: Nyoka Cowden, MD (Physician)      Prior Versions: 1. Nyoka Cowden, MD (Physician) at 09/27/2019 11:37 AM - Signed  I recommend a diuretic called aldactazide 25-25 but will defer this to Dr Martha Clan    Please schedule a follow up visit in 12  months but call sooner if needed

## 2020-01-07 NOTE — Telephone Encounter (Signed)
Appointment made, for patient to come in a walk with POC  Order placed for tanks for patient to get out of her house to get to doctors appointments.  Nothing further needed at this time.

## 2020-01-07 NOTE — Telephone Encounter (Signed)
[phone] calling back

## 2020-01-07 NOTE — Telephone Encounter (Signed)
Attempted to call pt but unable to reach. Left  Message for her to return call.

## 2020-01-10 ENCOUNTER — Ambulatory Visit (INDEPENDENT_AMBULATORY_CARE_PROVIDER_SITE_OTHER): Payer: Medicare Other | Admitting: Internal Medicine

## 2020-01-10 ENCOUNTER — Other Ambulatory Visit: Payer: Self-pay

## 2020-01-10 DIAGNOSIS — J9611 Chronic respiratory failure with hypoxia: Secondary | ICD-10-CM

## 2020-01-10 NOTE — Progress Notes (Signed)
Qualifying walk test performed today.  Walk indicated the need for 2lpm pulse dose O2 with exertion.  Will order POC per previous documentation.  Patient has not been seen since 09/27/19 and does not have an office visit until next September.  Insurance requires an office visit within 30 days of walk test.  Patient has been scheduled for a televisit with MW on 12/28 to discuss POC need.  Nothing further needed at this time- will close encounter.

## 2020-01-22 ENCOUNTER — Ambulatory Visit (INDEPENDENT_AMBULATORY_CARE_PROVIDER_SITE_OTHER): Payer: Medicare Other | Admitting: Internal Medicine

## 2020-01-22 ENCOUNTER — Other Ambulatory Visit: Payer: Self-pay

## 2020-01-22 ENCOUNTER — Encounter: Payer: Self-pay | Admitting: Internal Medicine

## 2020-01-22 DIAGNOSIS — J479 Bronchiectasis, uncomplicated: Secondary | ICD-10-CM | POA: Diagnosis not present

## 2020-01-22 DIAGNOSIS — J9611 Chronic respiratory failure with hypoxia: Secondary | ICD-10-CM

## 2020-01-22 NOTE — Assessment & Plan Note (Signed)
Onset of symptoms 1980s - CT 11/07/08: Bronchiectasis in the lingula  > rml plus small HH  - Sinus CT neg 04/17/09   -PFT's 09/16/2010   FEV1  .89( 55%)  Ratio 49  No better with B2,  DLCO 62 > improved to 107 - PFTs  08/10/2012    FEV1  0.78 (50%) ratio 58,  No better p B2  DLCO 55 improved to 86% - Alpha one AT phenotype 07/07/10: MM CT chest>   03/16/13 bronchiectasis with nodules - Spirometry 02/10/2015  FEV1  0.54 (33%) ratio 53   - PFT's  07/28/2015  FEV1 0.82 (56 % ) ratio 57  p 3 % improvement from saba p laba/ics neb  prior to study with DLCO  48/53 % corrects to 83 % for alv volume   - 07/02/16 rec doxy prn flares  - PFT's  07/12/2017  FEV1 0.65 (48 % ) ratio 59  p no % improvement from saba p nothing prior to study with DLCO  45 % corrects to 75 % for alv volume   - 07/12/17 Changed prn abx to cipro 750 bid x 10 days as doxy no longer effective  - 03/03/2018  After extensive coaching inhaler device,  effectiveness =    75%  (short Ti) - 09/27/2018 changed prn abx to omnicef 300 mg bid x 7 days based on previous repsonse and sputum growing e coli 03/31/2018      Add back mucinex max doses/ flutter/ omnicef is mucus gets nastier or stays discolored for more than just the am cough  - if not responding will need another am sample for afb and routine c/s

## 2020-01-22 NOTE — Patient Instructions (Signed)
I very strongly recommend you get the moderna or pfizer vaccine as soon as possible based on your risk of dying from the virus  and the proven safety and benefit of these vaccines against even the delta and omicron variant.  This can save your life as well as  those of your loved ones,  especially if they are also not vaccinated.   We will order your Portable concentrator now.  Make sure you check your oxygen saturation  at your highest level of activity  to be sure it stays over 90% and adjust  02 flow upward to maintain this level if needed but remember to turn it back to previous settings when you stop (to conserve your supply).   For cough / congestion> mucinex 1200 mg every 12 hours and flutter valve as much as possible   We need to get you back for a cxr and visit with me or one of our NP's asap to sort out why your 02 levels are dropping.

## 2020-01-22 NOTE — Progress Notes (Signed)
Subjective:    Patient ID: Lacey Stanton, female    DOB: [birth year]   MRN: [id number]   Brief patient profile:  53  yowf never smoker with obstructive  bronchiectasis with resp symptoms  of cough/sob dating back to  the 1980's   History of Present Illness  06/29/2012 f/u ov/Lacey Stanton re bronchiectasis on 02 just at hs and bud/brovana bid maint rx Chief Complaint  Patient presents with  . Follow-up    Pt c/o increased SOB for the past month. She feels that nebs are not lasting long enough any longer.   at baseline maintaining brovana budesonide and rarely needing saba hfa Then one month pta fever no cough but sob and increased proaire and used 02  > ov with Dr Martha Clan rx Avelox and fever resolved but not breathing to her satisfaction and using proaire 2 x daily but no neb saba. Doe x > slow adls rec increase zantac (ranitidine) to 150 mg after bfast and after supper  GERD  diet Continue to use the nebulizer brovana and budesonide and then 12 hours later. Prednisone 10 mg take  4 each am x 2 days,   2 each am x 2 days,  1 each am x2days and stop  Work on inhaler technique    Only use your albuterol (proaire) as rescue  02/06/2013 f/u ov/Lacey Stanton re: bronchiectasis on maint brovana/ bud plus prn saba hfa Chief Complaint  Patient presents with  . Follow-up    Pt states doing well and denies any new co's today.   typically more congested after lunch and feels her self wheezing so uses albuterol but not really helping much and  not using mucinex much  At all nor the flutter. Not limited by breathing though from desired activities rec Pantoprazole (protonix) 40 mg   Take 30-60 min before first meal of the day and Zantac 150  mg one @ bedtime until return to office GERD diet For cough > flutter valve and mucinex or mucinex dm up 1200 mg every 12hours  Relax and do purse lip breath breathing as much as possible.     10/18/2017  f/u ov/Lacey Stanton re: bronchiectasis Chief Complaint  Patient presents with   . Follow-up    Breathing is "okay". She states still coughing up yellow sputum.  She is using her albuterol inhaler 2 x per wk.   Dyspnea:  MMRC2 = can't walk a nl pace on a flat grade s sob but does fine slow and flat / slowed by coughing Cough: minimal yellow esp am  Sleeping: ok on back 2 pillows / bed flat  SABA use: twice a week 02 2lpm hs / rarely uses during the day  rec Plan A = Automatic =  Brovana / Budesonide twice daily  Plan B = Backup for breathing/ congestion Only use your albuterol as a rescue medication Plan C = Crisis - only use your albuterol nebulizer if you first try Plan B and it fails to help > ok to use the nebulizer up to every 4 hours but if start needing it regularly call for immediate appointment For cough / congestion > mucinex dm up to 1200 mg every 12 hours and use the flutter valve as much as possible  Prednisone 10 mg take  4 each am x 2 days,   2 each am x 2 days,  1 each am x 2 days and stop       03/03/2018  f/u ov/Lacey Stanton re: obstructive bronchiectasis  Chief  Complaint  Patient presents with  . Follow-up    Pepcid not working, still having colored mucus, oxygen levels dropping even at rest, on oxygen at night, SOB  Dyspnea: 2 wheeled walker / mailbox 50 ft flat is her limit = MMRC3 = can't walk 100 yards even at a slow pace at a flat grade s stopping due to sob   Cough: about the same/ cipro really didn't help but can't tol avelox and pcn allergic per records/ not using flutter or mucinex as rec  Sleeping: bed flat/ 3 pillows SABA use: 3-4 x per week 02: 2lpm hs rec  Plan A = Automatic =  Brovana / Budesonide 1st thing in am and 12 hours later Plan B = Backup for breathing/ congestion Only use your albuterol as a rescue medication Work on inhaler technique:   Plan C = Crisis - only use your albuterol nebulizer if you first try Plan B  For cough / congestion > mucinex dm up to 1200 mg every 12 hours and use the flutter valve as much as possible   Prednisone 10 mg take  4 each am x 2 days,   2 each am x 2 days,  1 each am x 2 days and stop  Zpak   Sputum culture 03/31/18  E coli > rx omnicef x 7 days > much better     08/09/2018  f/u ov/Lacey Stanton re: bronchiectasis on brovana/bud bid  Confused with details of care, did not bring new med calendar but says "best she's been in a while"  Chief Complaint  Patient presents with  . Other    Bronchiectasis - patient has been doing well since last visit.Medications have been working.  Dyspnea:  Walk in hallways at home and mailbox with a cane or walker  Cough: some in am dark but white  Sleeping:  1- 2 pillows but bed is flat  SABA use: not needing  02: 2lpm hs and famotidine but still with breakthru sense of hb/ "Chest congestion " at hs rec Pepcid hs   09/27/2018 televisit Omnicef 300 mg twice daily for 7 days as needed for nasty mucus - add this to your action plan at bottom of your med calendar  If 02 sats drops from mid 90s at rest on room air you will need to be evaluated in ER  We can't assume this is not COVID -19 so you need to isolate for the next 13 days and any contact will need to isolate with the other option being you get tested now or they get tested in 5 days but if any of your contacts get sick in the next week I would assume everyone has COVID as it's too soon for flu season.  See calendar for specific medication instructions     02/13/2019  f/u ov/Lacey Stanton re: bronchiectasis  Chief Complaint  Patient presents with  . Follow-up    Breathing is overall doing well. She is coughing more at night- producing light yellow to clear sputum.   Dyspnea:  Cane at home mb and back slow pace does ok  Cough: at hs / minimally discolored, much better p omnicef x 7d  SABA use: none needed 02: 2lpm  Hs but not needed during the day  rec For night time cough take either mucinex dm or your over the counter   Chlorpheniramine 4 mg at bedime  Take the covid vaccine as soon as it is offered>  J&J April 04 2019  08/09/2019  f/u ov/Lacey Stanton re: obstructive bronchiectasison brovana/bud bid  post admit to [facility] June 26 21 x 48h Chief Complaint  Patient presents with  . Follow-up    shortness of breath with exertion  Dyspnea:  Walking around appt complex  Cough: minimal clear  Sleeping: able to sleep ok bed blocks/ 2 pillows  SABA use once a month 02: 2lpm HS  rec Take omnicef if needed    09/27/2019  f/u ov/Lacey Stanton re: obstructive bronchiectasis/ brovana/ bud no recent omnicef needed  Chief Complaint  Patient presents with  . Follow-up  Dyspnea:  Not going outside now, walking around appt with walker with sats 91% and breathing better  Cough: yellow in ams then clears  Sleeping: bed block / 2 pillows  SABA use: rarely  02: 2lpm hs  L > R swelling not resolving overnight > echo done and ok  rec I recommend a diuretic called aldactazide 25-25 but will defer this to Dr Martha Clan  Please schedule a follow up visit in 12  months but call sooner if needed    Virtual Visit via Telephone Note 01/22/2020   I connected with Lacey Stanton on 01/22/20 at  8:45 AM EST by telephone and verified that I am speaking with the correct person using two identifiers. Pt is at home and this call made from my office with no other participants    I discussed the limitations, risks, security and privacy concerns of performing an evaluation and management service by telephone and the availability of in person appointments. I also discussed with the patient that there may be a patient responsible charge related to this service. The patient expressed understanding and agreed to proceed.   re: obstructive bronchiectasis  Chief Complaint  Patient presents with  . Follow-up    Shortness of breath with exertion  Dyspnea: onset x 4-6 weeks somewhat worse doe s explanation/ qualified for amb POC 01/10/20 but doesn't have it yet Cough: about nl for her/ no recent omnicef  Sleeping: bed blocks/ 2-3  pillows SABA use:  Not using  02: 2lpm hs has concentrator  Leg swelling better   No obvious day to day or daytime variability or assoc   mucus plugs or hemoptysis or cp or chest tightness, subjective wheeze or overt sinus or hb symptoms.   Sleeping as above without nocturnal  or early am exacerbation  of respiratory  c/o's or need for noct saba. Also denies any obvious fluctuation of symptoms with weather or environmental changes or other aggravating or alleviating factors except as outlined above   No unusual exposure hx or h/o childhood pna/ asthma or knowledge of premature birth.  Current Allergies, Complete Past Medical History, Past Surgical History, Family History, and Social History were reviewed in [employer] record.  ROS  The following are not active complaints unless bolded Hoarseness, sore throat, dysphagia, dental problems, itching, sneezing,  nasal congestion or discharge of excess mucus or purulent secretions, ear ache,   fever, chills, sweats, unintended wt loss or wt gain, classically pleuritic or exertional cp,  orthopnea pnd or arm/hand swelling  or leg swelling much better , presyncope, palpitations, abdominal pain, anorexia, nausea, vomiting, diarrhea  or change in bowel habits or change in bladder habits, change in stools or change in urine, dysuria, hematuria,  rash, arthralgias, visual complaints, headache, numbness, weakness or ataxia or problems with walking or coordination/ uses walker   change in mood or  memory.  Current Meds  Medication Sig  . BIOTIN PO Take 1,000 mg by mouth daily.  Marland Kitchen BROVANA 15 MCG/2ML NEBU USE 1 VIAL  IN  NEBULIZER TWICE  DAILY - Morning and Evening  . budesonide (PULMICORT) 0.5 MG/2ML nebulizer solution USE 1 VIAL  IN  NEBULIZER TWICE  DAILY - Rinse mouth after each treatment  . Cholecalciferol (VITAMIN D-3) 5000 units TABS Take 1 tablet by mouth daily.  . famotidine (PEPCID) 20 MG tablet  TAKE ONE TABLET BY MOUTH EVERY DAY AT BEDTIME  . levothyroxine (SYNTHROID, LEVOTHROID) 50 MCG tablet Take by mouth daily.  . Melatonin 10 MG TABS Take 1 tablet by mouth at bedtime.  . OXYGEN 2 lpm with sleep and as needed during the day  . pantoprazole (PROTONIX) 40 MG tablet Take 1 tablet (40 mg total) by mouth daily. Take 30-60 min before first meal of the day  . Prenatal Vit-Fe Sulfate-FA (PRENATAL MULTIVIT-IRON PO) Take 1 tablet by mouth daily.  . Probiotic CAPS Take 1 capsule by mouth daily.  Marland Kitchen Respiratory Therapy Supplies (FLUTTER) DEVI Use as directed  . verapamil (CALAN-SR) 120 MG CR tablet Take 120 mg by mouth at bedtime.                 Past Medical History:  Bronchiectasis  - CT 11/07/08: Bronchiectasis in the lingular > RML plus small HH  - Sinus CT neg 04/17/09  - HFA 25 > 75% effective April 15, 2009  -PFT's 09/16/2010   FEV1  .89( 55%)  Ratio 49  No better with B2,  DLCO 62 > improved to 107 Pneumonia > cleared radiographically 07/01/2010     - Pneumovax March 2011  Allergic Rhinitis  Pulmonary hemorrhage 1979   Osteoarthritis      - s/p  L TKR 02/2000 >>  Difficult recovery Hypothyroidism  GERD diet          Observations/Objective: Nl conversational sob/ min congested sounding cough, good voice texture    Assessment and Plan: See problem list for active a/p's   Follow Up Instructions: See avs for instructions unique to this ov which includes revised/ updated med list     I discussed the assessment and treatment plan with the patient. The patient was provided an opportunity to ask questions and all were answered. The patient agreed with the plan and demonstrated an understanding of the instructions.   The patient was advised to call back or seek an in-person evaluation if the symptoms worsen or if the condition fails to improve as anticipated.  I provided 25 minutes of non-face-to-face time during this encounter.   Sandrea Hughs, MD

## 2020-01-22 NOTE — Assessment & Plan Note (Addendum)
03/28/13 Qualification  Patient Saturations on Room Air at Rest = 93% Patient Saturations on [employer] while Ambulating = 88% Patient Saturations on 2 Liters of oxygen while Ambulating = 97% - 10/22/2014  Walked RA x 2 laps @ 185 ft each stopped due sob at slow pace but sats still 90%   - 07/28/2015  Walked RA x 3 laps @ 185 ft each stopped due to  End of study, nl pace, no sob or desat   - 12/16 21 Patient Saturations on Room Air at Rest = 98% Patient Saturations on Room Air while Ambulating = 87% Patient Saturations on 2 pulse dose Liters of oxygen while Ambulating = 94%  rec 2lpm hs and 2lpm POC walking, none needed at rest as of 01/22/2020   Patient is unvaccinated and was informed of the seriousness of COVID 19 infection as a direct risk to lung health as well as safety to close contacts and should continue to wear a facemask in public and minimize exposure to public locations but especially avoid any area or activity where non-close contacts are not observing distancing or wearing an appropriate face mask.  I strongly recommended  Boosting  with the Moderna and [employer] products  which have proven both safe and  effective even against the new delta and omicron  Variant in terms of hospitalizations and death.   Each maintenance medication was reviewed in detail including most importantly the difference between maintenance and as needed and under what circumstances the prns are to be used.  Please see AVS for specific  Instructions which are unique to this visit and I personally typed out  which were reviewed in detail over the phone with the patient and a copy provided via my chart

## 2020-01-23 ENCOUNTER — Telehealth: Payer: Self-pay | Admitting: Internal Medicine

## 2020-01-23 NOTE — Addendum Note (Signed)
Addended by: Melonie [place] on: 01/23/2020 11:31 AM   Modules accepted: Orders

## 2020-01-23 NOTE — Telephone Encounter (Signed)
Called and scheduled office visit per Dr Sherene Sires with Dr Sherene Sires for Monday 01/28/2020 at 9:15am at the [facility] office. Patient made aware that chest xray will need to be done at the office when she gets to the appointment. Order placed per Dr Sherene Sires. Patient agreeable to time, date and location.   Patient confirmed that she received the portable O2 from [facility] yesterday (01/22/2020).  Patient stated that the [facility] in [place] needed refills signed by Dr Sherene Sires for her nebulizer. Writer called pharmacy and confirmed that the neb medications (Brovana & Pulmicort) were good until October 2022 but they needed renewal script for nebulizer administration set and small vol non-disposable neb. Pharmacy faxed scripts to [facility] office and Dr Sherene Sires signed and both scripts were faxed back (with success result) today at 12:29 pm. Called and updated patient. Patient stated there is nothing further needed at this time.

## 2020-01-28 ENCOUNTER — Ambulatory Visit (INDEPENDENT_AMBULATORY_CARE_PROVIDER_SITE_OTHER): Payer: Medicare Other

## 2020-01-28 ENCOUNTER — Ambulatory Visit: Payer: Medicare Other | Admitting: Internal Medicine

## 2020-01-28 ENCOUNTER — Other Ambulatory Visit: Payer: Self-pay

## 2020-01-28 ENCOUNTER — Encounter: Payer: Self-pay | Admitting: *Deleted

## 2020-01-28 ENCOUNTER — Ambulatory Visit (INDEPENDENT_AMBULATORY_CARE_PROVIDER_SITE_OTHER): Payer: Medicare Other | Admitting: Internal Medicine

## 2020-01-28 ENCOUNTER — Encounter: Payer: Self-pay | Admitting: Internal Medicine

## 2020-01-28 DIAGNOSIS — R0600 Dyspnea, unspecified: Secondary | ICD-10-CM

## 2020-01-28 DIAGNOSIS — J479 Bronchiectasis, uncomplicated: Secondary | ICD-10-CM | POA: Diagnosis not present

## 2020-01-28 DIAGNOSIS — J9611 Chronic respiratory failure with hypoxia: Secondary | ICD-10-CM

## 2020-01-28 DIAGNOSIS — R0609 Other forms of dyspnea: Secondary | ICD-10-CM

## 2020-01-28 LAB — CBC WITH DIFFERENTIAL/PLATELET
Basophils Absolute: 0.1 10*3/uL (ref 0.0–0.1)
Basophils Relative: 1.1 % (ref 0.0–3.0)
Eosinophils Absolute: 0.1 10*3/uL (ref 0.0–0.7)
Eosinophils Relative: 1.9 % (ref 0.0–5.0)
HCT: 40.7 % (ref 36.0–46.0)
Hemoglobin: 13.5 g/dL (ref 12.0–15.0)
Lymphocytes Relative: 12.6 % (ref 12.0–46.0)
Lymphs Abs: 0.7 10*3/uL (ref 0.7–4.0)
MCHC: 33.2 g/dL (ref 30.0–36.0)
MCV: 93.1 fl (ref 78.0–100.0)
Monocytes Absolute: 0.5 10*3/uL (ref 0.1–1.0)
Monocytes Relative: 9.3 % (ref 3.0–12.0)
Neutro Abs: 4.2 10*3/uL (ref 1.4–7.7)
Neutrophils Relative %: 75.1 % (ref 43.0–77.0)
Platelets: 272 10*3/uL (ref 150.0–400.0)
RBC: 4.37 Mil/uL (ref 3.87–5.11)
RDW: 14.4 % (ref 11.5–15.5)
WBC: 5.6 10*3/uL (ref 4.0–10.5)

## 2020-01-28 LAB — BASIC METABOLIC PANEL
BUN: 14 mg/dL (ref 6–23)
CO2: 28 mEq/L (ref 19–32)
Calcium: 10.1 mg/dL (ref 8.4–10.5)
Chloride: 100 mEq/L (ref 96–112)
Creatinine, Ser: 0.63 mg/dL (ref 0.40–1.20)
GFR: 77.25 mL/min (ref >60.00)
Glucose, Bld: 84 mg/dL (ref 70–99)
Potassium: 4.1 mEq/L (ref 3.5–5.1)
Sodium: 137 mEq/L (ref 135–145)

## 2020-01-28 LAB — TSH: TSH: 1.26 u[IU]/mL (ref 0.35–4.50)

## 2020-01-28 LAB — HEPATIC FUNCTION PANEL
ALT: 13 U/L (ref 0–35)
AST: 19 U/L (ref 0–37)
Albumin: 4.2 g/dL (ref 3.5–5.2)
Alkaline Phosphatase: 66 U/L (ref 39–117)
Bilirubin, Direct: 0.1 mg/dL (ref 0.0–0.3)
Total Bilirubin: 0.6 mg/dL (ref 0.2–1.2)
Total Protein: 6.7 g/dL (ref 6.0–8.3)

## 2020-01-28 LAB — SEDIMENTATION RATE: Sed Rate: 51 mm/hr — ABNORMAL HIGH (ref 0–30)

## 2020-01-28 LAB — BRAIN NATRIURETIC PEPTIDE: Pro B Natriuretic peptide (BNP): 45 pg/mL (ref 0.0–100.0)

## 2020-01-28 LAB — SARS-COV-2 IGG: SARS-COV-2 IgG: 0.14

## 2020-01-28 MED ORDER — BUDESONIDE 0.5 MG/2ML IN SUSP: RESPIRATORY_TRACT | Status: DC

## 2020-01-28 MED ORDER — PREDNISONE 10 MG PO TABS: ORAL_TABLET | ORAL | 0 refills | Status: DC

## 2020-01-28 MED ORDER — AZITHROMYCIN 250 MG PO TABS: ORAL_TABLET | ORAL | 1 refills | Status: DC

## 2020-01-28 NOTE — Progress Notes (Signed)
Subjective:    Patient ID: Lacey Stanton, female    DOB: 10/09/1926   MRN: [id number]   Brief patient profile:  27  yowf never smoker/MM with obstructive  bronchiectasis with resp symptoms  of cough/sob dating back to  the 1980's   History of Present Illness  06/29/2012 f/u ov/[name] re bronchiectasis on 02 just at hs and bud/brovana bid maint rx Chief Complaint  Patient presents with  . Follow-up    Pt c/o increased SOB for the past month. She feels that nebs are not lasting long enough any longer.   at baseline maintaining brovana budesonide and rarely needing saba hfa Then one month pta fever no cough but sob and increased proaire and used 02  > ov with Dr Martha Clan rx Avelox and fever resolved but not breathing to her satisfaction and using proaire 2 x daily but no neb saba. Doe x > slow adls rec increase zantac (ranitidine) to 150 mg after bfast and after supper  GERD  diet Continue to use the nebulizer brovana and budesonide and then 12 hours later. Prednisone 10 mg take  4 each am x 2 days,   2 each am x 2 days,  1 each am x2days and stop  Work on inhaler technique    Only use your albuterol (proaire) as rescue  02/06/2013 f/u ov/[name] re: bronchiectasis on maint brovana/ bud plus prn saba hfa Chief Complaint  Patient presents with  . Follow-up    Pt states doing well and denies any new co's today.   typically more congested after lunch and feels her self wheezing so uses albuterol but not really helping much and  not using mucinex much  At all nor the flutter. Not limited by breathing though from desired activities rec Pantoprazole (protonix) 40 mg   Take 30-60 min before first meal of the day and Zantac 150  mg one @ bedtime until return to office GERD diet For cough > flutter valve and mucinex or mucinex dm up 1200 mg every 12hours  Relax and do purse lip breath breathing as much as possible.     10/18/2017  f/u ov/Dalisa Forrer re: bronchiectasis Chief Complaint  Patient presents  with  . Follow-up    Breathing is "okay". She states still coughing up yellow sputum.  She is using her albuterol inhaler 2 x per wk.   Dyspnea:  MMRC2 = can't walk a nl pace on a flat grade s sob but does fine slow and flat / slowed by coughing Cough: minimal yellow esp am  Sleeping: ok on back 2 pillows / bed flat  SABA use: twice a week 02 2lpm hs / rarely uses during the day  rec Plan A = Automatic =  Brovana / Budesonide twice daily  Plan B = Backup for breathing/ congestion Only use your albuterol as a rescue medication Plan C = Crisis - only use your albuterol nebulizer if you first try Plan B and it fails to help > ok to use the nebulizer up to every 4 hours but if start needing it regularly call for immediate appointment For cough / congestion > mucinex dm up to 1200 mg every 12 hours and use the flutter valve as much as possible  Prednisone 10 mg take  4 each am x 2 days,   2 each am x 2 days,  1 each am x 2 days and stop       03/03/2018  f/u ov/[name] re: obstructive bronchiectasis  Chief  Complaint  Patient presents with  . Follow-up    Pepcid not working, still having colored mucus, oxygen levels dropping even at rest, on oxygen at night, SOB  Dyspnea: 2 wheeled walker / mailbox 50 ft flat is her limit = MMRC3 = can't walk 100 yards even at a slow pace at a flat grade s stopping due to sob   Cough: about the same/ cipro really didn't help but can't tol avelox and pcn allergic per records/ not using flutter or mucinex as rec  Sleeping: bed flat/ 3 pillows SABA use: 3-4 x per week 02: 2lpm hs rec  Plan A = Automatic =  Brovana / Budesonide 1st thing in am and 12 hours later Plan B = Backup for breathing/ congestion Only use your albuterol as a rescue medication Work on inhaler technique:   Plan C = Crisis - only use your albuterol nebulizer if you first try Plan B  For cough / congestion > mucinex dm up to 1200 mg every 12 hours and use the flutter valve as much as  possible  Prednisone 10 mg take  4 each am x 2 days,   2 each am x 2 days,  1 each am x 2 days and stop  Zpak   Sputum culture 03/31/18  E coli > rx omnicef x 7 days > much better     08/09/2018  f/u ov/[name] re: bronchiectasis on brovana/bud bid  Confused with details of care, did not bring new med calendar but says "best she's been in a while"  Chief Complaint  Patient presents with  . Other    Bronchiectasis - patient has been doing well since last visit.Medications have been working.  Dyspnea:  Walk in hallways at home and mailbox with a cane or walker  Cough: some in am dark but white  Sleeping:  1- 2 pillows but bed is flat  SABA use: not needing  02: 2lpm hs and famotidine but still with breakthru sense of hb/ "Chest congestion " at hs rec Pepcid hs   09/27/2018 televisit Omnicef 300 mg twice daily for 7 days as needed for nasty mucus - add this to your action plan at bottom of your med calendar  If 02 sats drops from mid 90s at rest on room air you will need to be evaluated in ER  We can't assume this is not COVID -19 so you need to isolate for the next 13 days and any contact will need to isolate with the other option being you get tested now or they get tested in 5 days but if any of your contacts get sick in the next week I would assume everyone has COVID as it's too soon for flu season.  See calendar for specific medication instructions     02/13/2019  f/u ov/[name] re: bronchiectasis  Chief Complaint  Patient presents with  . Follow-up    Breathing is overall doing well. She is coughing more at night- producing light yellow to clear sputum.   Dyspnea:  Cane at home mb and back slow pace does ok  Cough: at hs / minimally discolored, much better p omnicef x 7d  SABA use: none needed 02: 2lpm  Hs but not needed during the day  rec For night time cough take either mucinex dm or your over the counter   Chlorpheniramine 4 mg at bedime  Take the covid vaccine as soon as it is  offered> J&J April 04 2019  08/09/2019  f/u ov/[name] re: obstructive bronchiectasison brovana/bud bid  post admit to [facility] June 26 21 x 48h Chief Complaint  Patient presents with  . Follow-up    shortness of breath with exertion  Dyspnea:  Walking around appt complex  Cough: minimal clear  Sleeping: able to sleep ok bed blocks/ 2 pillows  SABA use once a month 02: 2lpm HS  rec Take omnicef if needed    09/27/2019  f/u ov/Lucky Trotta re: obstructive bronchiectasis/ brova bud no recent omnicef needed  Chief Complaint  Patient presents with  . Follow-up  Dyspnea:  Not going outside now, walking around appt with walker with sats 91% and breathing better  Cough: yellow in ams then clears  Sleeping: bed block / 2 pillows  SABA use: rarely  02: 2lpm hs  L > R swelling not resolving overnight > echo done and ok  rec I recommend a diuretic called aldactazide 25-25 but will defer this to Dr Martha Clan      01/22/20 televisit re worse doe/desats  I very strongly recommend you get the moderna or pfizer vaccine   We will order your Portable concentrator now. Make sure you check your oxygen saturation  at your highest level of activity  to be sure it stays over 90% and adjust  02 flow upward  For cough / congestion> mucinex 1200 mg every 12 hours and flutter valve as much as possible  We need to get you back for a cxr and visit with me or one of our NP's asap to sort out why your 02 levels are dropping.    01/28/2020 acute ov/Cathey Fredenburg re: obstructive bronchiectasis  - sob worse x 4 weeks p one episode N and V but no additional episodes  Chief Complaint  Patient presents with  . Follow-up    Obstructive bronchiectasis  Dyspnea:  Walking to living room daily o/w very little activity  Cough: can't tolerate mucinex 600 ok on mucinex junior/ minimal am yellow production/ using flutter valve/ has not used omnicef since onset of worse sob   Sleeping:  30-45 degrees x months SABA use: avg use once a day   02: 2lpm 24/7 does not titrate      No obvious day to day or daytime variability or assoc excess/ purulent sputum or mucus plugs or hemoptysis or cp or chest tightness, subjective wheeze or overt sinus or hb symptoms.     Also denies any obvious fluctuation of symptoms with weather or environmental changes or other aggravating or alleviating factors except as outlined above   No unusual exposure hx or h/o childhood pna/ asthma or knowledge of premature birth.  Current Allergies, Complete Past Medical History, Past Surgical History, Family History, and Social History were reviewed in [employer] record.  ROS  The following are not active complaints unless bolded Hoarseness, sore throat, dysphagia, dental problems, itching, sneezing,  nasal congestion or discharge of excess mucus or purulent secretions, ear ache,   fever, chills, sweats, unintended wt loss or wt gain, classically pleuritic or exertional cp,  orthopnea pnd or arm/hand swelling  or leg swelling, presyncope, palpitations, abdominal pain, anorexia, nausea, vomiting, diarrhea  or change in bowel habits or change in bladder habits, change in stools or change in urine, dysuria, hematuria,  rash, arthralgias, visual complaints, headache, numbness, weakness or ataxia or problems with walking or coordination,  change in mood or  memory.        Current Meds  Medication Sig  . BIOTIN  PO Take 1,000 mg by mouth daily.  Marland Kitchen BROVANA 15 MCG/2ML NEBU USE 1 VIAL  IN  NEBULIZER TWICE  DAILY - Morning and Evening  . budesonide (PULMICORT) 0.5 MG/2ML nebulizer solution USE 1 VIAL  IN  NEBULIZER TWICE  DAILY - Rinse mouth after each treatment  . cefdinir (OMNICEF) 300 MG capsule Take 1 capsule (300 mg total) by mouth 2 (two) times daily.  . Cholecalciferol (VITAMIN D-3) 5000 units TABS Take 1 tablet by mouth daily.  . clotrimazole (CLOTRIMAZOLE ANTI-FUNGAL) 1 % cream Apply 1 application topically daily.  . famotidine (PEPCID)  20 MG tablet TAKE ONE TABLET BY MOUTH EVERY DAY AT BEDTIME  . levothyroxine (SYNTHROID, LEVOTHROID) 50 MCG tablet Take by mouth daily.  . Magnesium 250 MG TABS Take 1 tablet by mouth daily.  . Melatonin 10 MG TABS Take 1 tablet by mouth at bedtime.  . OXYGEN 2 lpm with sleep and as needed during the day  . pantoprazole (PROTONIX) 40 MG tablet Take 1 tablet (40 mg total) by mouth daily. Take 30-60 min before first meal of the day  . Prenatal Vit-Fe Sulfate-FA (PRENATAL MULTIVIT-IRON PO) Take 1 tablet by mouth daily.  . Probiotic CAPS Take 1 capsule by mouth daily.  Marland Kitchen Respiratory Therapy Supplies (FLUTTER) DEVI Use as directed  . triamcinolone cream (KENALOG) 0.1 % Apply 1 fingertip amount to itching/red areas once daily.  . verapamil (CALAN-SR) 120 MG CR tablet Take 120 mg by mouth at bedtime.               Past Medical History:  Bronchiectasis  - CT 11/07/08: Bronchiectasis in the lingular > RML plus small HH  - Sinus CT neg 04/17/09  - HFA 25 > 75% effective April 15, 2009  -PFT's 09/16/2010   FEV1  .89( 55%)  Ratio 49  No better with B2,  DLCO 62 > improved to 107 Pneumonia > cleared radiographically 07/01/2010     - Pneumovax March 2011  Allergic Rhinitis  Pulmonary hemorrhage 1979   Osteoarthritis      - s/p  L TKR 02/2000 >>  Difficult recovery Hypothyroidism  GERD diet       Objective:   Physical Exam   01/28/2020     97  09/27/2019    106   08/09/2019   106   03/03/2018    115  08/09/2018    115  12/05/2017 113    Vital signs reviewed  01/28/2020  - Note at rest 02 sats  92% on 2lpm   General appearance:    Chronically but not acutely ill appearing    Reports full dentures   HEENT : pt wearing mask not removed for exam due to covid -19 concerns.    NECK :  without JVD/Nodes/TM/ nl carotid upstrokes bilaterally   LUNGS: no acc muscle use,  Mod kyphotic contour chest with a few late  exp wheezes   bilaterally without cough on insp or exp maneuvers   CV:  RRR  no s3  or murmur or increase in P2, and no edema   ABD:  soft and nontender with nl inspiratory excursion in the supine position. No bruits or organomegaly appreciated, bowel sounds nl  MS:   ext warm without deformities, calf tenderness, cyanosis or clubbing No obvious joint restrictions   SKIN: warm and dry without lesions    NEURO:  alert, approp, nl sensorium with R foot drop wearing brace      CXR PA and Lateral:  01/28/2020 :    I personally reviewed images and agree with radiology impression as follows:    Stable chest exam without interval change or acute process.  Chronic lung disease changes and bronchiectasis again noted.  Chronic thoracic spine compression fractures   Labs ordered/ reviewed:      Chemistry      Component Value Date/Time   NA 137 01/28/2020 1004   K 4.1 01/28/2020 1004   CL 100 01/28/2020 1004   CO2 28 01/28/2020 1004   BUN 14 01/28/2020 1004   CREATININE 0.63 01/28/2020 1004      Component Value Date/Time   CALCIUM 10.1 01/28/2020 1004   ALKPHOS 66 01/28/2020 1004   AST 19 01/28/2020 1004   ALT 13 01/28/2020 1004   BILITOT 0.6 01/28/2020 1004        Lab Results  Component Value Date   WBC 5.6 01/28/2020   HGB 13.5 01/28/2020   HCT 40.7 01/28/2020   MCV 93.1 01/28/2020   PLT 272.0 01/28/2020       EOS                                                              0.1                                     01/28/2020    Lab Results  Component Value Date   DDIMER 0.47 01/28/2020      Lab Results  Component Value Date   TSH 1.26 01/28/2020     Lab Results  Component Value Date   PROBNP 45.0 01/28/2020       Lab Results  Component Value Date   ESRSEDRATE 51 (H) 01/28/2020   ESRSEDRATE 23 01/12/2007                 Assessment & Plan:

## 2020-01-28 NOTE — Patient Instructions (Addendum)
Make sure you check your oxygen saturation  at your highest level of activity  to be sure it stays over 90% and adjust  02 flow upward to maintain this level if needed but remember to turn it back to previous settings when you stop (to conserve your supply).   Prednisone 10 mg take  4 each am x 2 days,   2 each am x 2 days,  1 each am x 2 days and stop   Zpak x 2 (on the 6th day just take one so 11 days of therapy   Decrease Budesonide to once daily - no change on the brovana every 12 hours    Please remember to go to the lab department   for your tests - we will call you with the results when they are available.  Please schedule a follow up office visit in 2  weeks, sooner if needed -  If getting worse will need to go to [facility].  ? Hrct/ repeat sputum on return.

## 2020-01-29 ENCOUNTER — Encounter: Payer: Self-pay | Admitting: Internal Medicine

## 2020-01-29 NOTE — Assessment & Plan Note (Signed)
Worse x fall 2021   Pt does appear to have difficult to sort out respiratory symptoms of unknown origin for which  DDX  = almost all start with A and  include Adherence, Ace Inhibitors, Acid Reflux, Active Sinus Disease, Alpha 1 Antitripsin deficiency, Anxiety masquerading as Airways dz,  ABPA,  Allergy(esp in young), Aspiration (esp in elderly), Adverse effects of meds,  Active smoking or Vaping, A bunch of PE's/clot burden (a few small clots can't cause this syndrome unless there is already severe underlying pulm or vascular dz with poor reserve),  Anemia or thyroid disorder, plus two Bs  = Bronchiectasis and Beta blocker use..and one C= CHF   Adherence is always the initial "prime suspect" and is a multilayered concern that requires a "trust but verify" approach in every patient - starting with knowing how to use medications, especially inhalers, correctly, keeping up with refills and understanding the fundamental difference between maintenance and prns vs those medications only taken for a very short course and then stopped and not refilled.  - appears to be using meds as listed   ? Allergy /asthma component > Prednisone 10 mg take  4 each am x 2 days,   2 each am x 2 days,  1 each am x 2 days and stop - reviewed approp saba rx    ? Acid (or non-acid) GERD > always difficult to exclude as up to 75% of pts in some series report no assoc GI/ Heartburn symptoms> rec continue max (24h)  acid suppression and diet restrictions/ reviewed     ? Anemia / thyroid dz > ruled out today  ? Adverse drug effects > none of the usual suspects listed.  ? Aspiration> worse since episode of n and v but this resolved s obvious new as dz on cxr   ? Bronchiectasis > see  Sep a/p re ? MAI activity  ? chf > recent echo ok as is BNP

## 2020-01-29 NOTE — Assessment & Plan Note (Addendum)
Onset of symptoms 1980s - CT 11/07/08: Bronchiectasis in the lingula  > rml plus small HH  - Sinus CT neg 04/17/09   -PFT's 09/16/2010   FEV1  .89( 55%)  Ratio 49  No better with B2,  DLCO 62 > improved to 107 - PFTs  08/10/2012    FEV1  0.78 (50%) ratio 58,  No better p B2  DLCO 55 improved to 86% - Alpha one AT phenotype 07/07/10: MM CT chest>   03/16/13 bronchiectasis with nodules - Spirometry 02/10/2015  FEV1  0.54 (33%) ratio 53   - PFT's  07/28/2015  FEV1 0.82 (56 % ) ratio 57  p 3 % improvement from saba p laba/ics neb  prior to study with DLCO  48/53 % corrects to 83 % for alv volume   - 07/02/16 rec doxy prn flares  - PFT's  07/12/2017  FEV1 0.65 (48 % ) ratio 59  p no % improvement from saba p nothing prior to study with DLCO  45 % corrects to 75 % for alv volume   - 07/12/17 Changed prn abx to cipro 750 bid x 10 days as doxy no longer effective  - 03/03/2018  After extensive coaching inhaler device,  effectiveness =    75%  (short Ti) - 09/27/2018 changed prn abx to omnicef 300 mg bid x 7 days based on previous repsonse and sputum growing e coli 03/31/2018      Minimal wheeze on exam and concerned about  MAI here rather than an acute bacterial process so rec reduce bud by one half and rx for 10 days with  zmax then f/u office visit to regroup.  Reviewed use of flutter/ demonstrated can use appropriately, and reviewed use of prn mucinex in low doses as can't tol regular adult doses.  Strongly rec she reconsider booster for covid but she declined again.

## 2020-01-29 NOTE — Assessment & Plan Note (Addendum)
03/28/13 Qualification  Patient Saturations on Room Air at Rest = 93% Patient Saturations on [employer] while Ambulating = 88% Patient Saturations on 2 Liters of oxygen while Ambulating = 97% - 10/22/2014  Walked RA x 2 laps @ 185 ft each stopped due sob at slow pace but sats still 90%   - 07/28/2015  Walked RA x 3 laps @ 185 ft each stopped due to  End of study, nl pace, no sob or desat   - 12/16 21 Patient Saturations on Room Air at Rest = 98% Patient Saturations on Room Air while Ambulating = 87% Patient Saturations on 2 pulse dose Liters of oxygen while Ambulating = 94%  rec 2lpm 24/ 7, ok to titrate up for goal > 90% at all times   consider repeat HRCT/ sputum collection  next ov if not improving on rx   Medical decision making was a high level of complexity in this case because of  two chronic conditions /diagnoses requiring extra time for  H and P, chart review, counseling, and generating customized AVS unique to this office visit and charting. Total time spent = 40 min   Each maintenance medication was reviewed in detail including emphasizing most importantly the difference between maintenance and prns and under what circumstances the prns are to be triggered using an action plan format where appropriate. Please see avs for details which were reviewed in writing by both me and my nurse and patient given a written copy highlighted where appropriate with yellow highlighter for the patient's continued care at home along with an updated version of their medications.  Patient was asked to maintain medication reconciliation by comparing this list to the actual medications being used at home and to contact this office right away if there is a conflict or discrepancy.

## 2020-01-30 ENCOUNTER — Other Ambulatory Visit: Payer: Self-pay | Admitting: Internal Medicine

## 2020-01-30 LAB — D-DIMER, QUANTITATIVE: D-Dimer, Quant: 0.47 mcg/mL FEU (ref <0.50)

## 2020-01-30 LAB — QUANTIFERON-TB GOLD PLUS
Mitogen-NIL: >10 IU/mL
NIL: 0.03 IU/mL
QuantiFERON-TB Gold Plus: NEGATIVE
TB1-NIL: <0 IU/mL
TB2-NIL: 0 IU/mL

## 2020-02-09 ENCOUNTER — Telehealth: Payer: Self-pay | Admitting: Internal Medicine

## 2020-02-09 ENCOUNTER — Encounter: Payer: Self-pay | Admitting: Internal Medicine

## 2020-02-09 NOTE — Telephone Encounter (Signed)
Lacey Stanton -daughter communication about mother, 87 yr old.  CC: finished tapering steroids, had Monday appointment with Dr Sherene Sires, but post pone to Thursday. Still has sob. Chronic cough, no fever. S/p recent antibiotics. On 2.5 lit of o2 at home. sats 86 to 91%. Using her nebs and Brovana.   Bronchiectasis . GERD.  Advised to go to ED, but says not needed, and her age > 90 yrs. As[facility] for [facility], that usally works until sees Dr Sherene Sires on Thursday.  Tried calling [facility]: Pharmacy closed.  Same with [facility]  Plan: - Lacey Stanton has her Dexamethasone 4 mg  emergency pack for her asthma.  Advised to use DExa 4 mg daily once for 4 days. Call Dr Sherene Sires Office on Monday . - will update same on outlook email hand off in AM. - if worsening to go to nearest ED-call EMS.

## 2020-02-11 ENCOUNTER — Ambulatory Visit: Payer: Medicare Other | Admitting: Adult Health

## 2020-02-14 ENCOUNTER — Ambulatory Visit (INDEPENDENT_AMBULATORY_CARE_PROVIDER_SITE_OTHER): Payer: Medicare Other

## 2020-02-14 ENCOUNTER — Ambulatory Visit (INDEPENDENT_AMBULATORY_CARE_PROVIDER_SITE_OTHER): Payer: Medicare Other | Admitting: Adult Health

## 2020-02-14 ENCOUNTER — Encounter: Payer: Self-pay | Admitting: Adult Health

## 2020-02-14 ENCOUNTER — Other Ambulatory Visit: Payer: Self-pay | Admitting: *Deleted

## 2020-02-14 ENCOUNTER — Other Ambulatory Visit: Payer: Self-pay

## 2020-02-14 DIAGNOSIS — R0600 Dyspnea, unspecified: Secondary | ICD-10-CM

## 2020-02-14 DIAGNOSIS — J479 Bronchiectasis, uncomplicated: Secondary | ICD-10-CM

## 2020-02-14 DIAGNOSIS — R918 Other nonspecific abnormal finding of lung field: Secondary | ICD-10-CM | POA: Diagnosis not present

## 2020-02-14 DIAGNOSIS — J9611 Chronic respiratory failure with hypoxia: Secondary | ICD-10-CM | POA: Diagnosis not present

## 2020-02-14 DIAGNOSIS — R0609 Other forms of dyspnea: Secondary | ICD-10-CM

## 2020-02-14 MED ORDER — METHYLPREDNISOLONE 4 MG PO TABS
4.0000 mg | ORAL_TABLET | Freq: Every day | ORAL | 1 refills | Status: DC
Start: 2020-02-14 — End: 2020-02-19

## 2020-02-14 NOTE — Assessment & Plan Note (Signed)
continue on oxygen 2-1/2 L with activity and at bedtime

## 2020-02-14 NOTE — Assessment & Plan Note (Signed)
Recent bronchiectatic exacerbation.  Improved after steroids and antibiotics.  Patient remains on low-dose steroids.  Says that she feels the best that she has felt in a very long time.  Long discussion regarding chronic steroid use especially in bronchiectasis as with possible underlying MAI, drug resistance and immunosuppression.  Patient education was given.  For now patient may remain on Medrol 4 mg daily.  On return visit in 4 to 6 weeks we will reevaluate and hopefully decrease dose and taper to off Discussed setting up for high-resolution CT chest to evaluate lung parenchyma more closely however patient declines at this time states she feels so much better she does not want to undergo any additional testing at this time. Cough and congestion have decreased.  If this returns and increases consider a repeat sputum culture with AFB.  Plan  Patient Instructions  Call back if you change your mind on CT scan of your lungs  Continue on Oxygen 2.5l/m with activity and At bedtime   Mucinex DM .Twice daily  As needed  Cough/congestion.  Flutter valve Three times a day  Continue on Budesonide Neb daily  Continue on [employer] Twice daily   Continue on Medrol 4mg  daily.  Follow up with Dr.  In 6 weeks and As needed   Please contact office for sooner follow up if symptoms do not improve or worsen or seek emergency care

## 2020-02-14 NOTE — Patient Instructions (Addendum)
Call back if you change your mind on CT scan of your lungs  Continue on Oxygen 2.5l/m with activity and At bedtime   Mucinex DM .Twice daily  As needed  Cough/congestion.  Flutter valve Three times a day  Continue on Budesonide Neb daily  Continue on [employer] Twice daily   Continue on Medrol 4mg  daily.  Follow up with Dr.  In 6 weeks and As needed   Please contact office for sooner follow up if symptoms do not improve or worsen or seek emergency care

## 2020-02-14 NOTE — Assessment & Plan Note (Signed)
Continue to follow.  Hold off on high-resolution CT chest at this time.  If cough and congestion increased.  Consider repeat sputum for AFB and sputum culture

## 2020-02-14 NOTE — Progress Notes (Signed)
@Patient  ID: , female    DOB: 09-May-1926, 87 y.o.   MRN: [id number]  Chief Complaint  Patient presents with  . Recurrent Pneumonia    Referring provider: No ref. provider found  HPI: 87 year old female never smoker followed for bronchiectasis Medical history significant for breast cancer  TEST/EVENTS :  Onset of symptoms 1980s - CT 11/07/08: Bronchiectasis in the lingula >rml plus small HH  - Sinus CT neg 04/17/09  -PFT's 09/16/2010 FEV1 .89( 55%) Ratio 49 No better with B2, DLCO 62 >improved to 107 - PFTs 08/10/2012 FEV1 0.78 (50%) ratio 58, No better p B2 DLCO 55 improved to 86% - Alpha one AT phenotype 07/07/10: MM CT chest>03/16/13 bronchiectasis with nodules - Spirometry 02/10/2015 FEV1 0.54 (33%) ratio 53  - PFT's 07/28/2015 FEV1 0.82 (56 % ) ratio 57 p 3 % improvement from saba p laba/ics neb prior to study with DLCO 48/53 % corrects to 83 % for alv volume  - 07/02/16 rec doxy prn flares  - PFT's 07/12/2017 FEV1 0.65 (48 % ) ratio 59 p no % improvement from saba p nothing prior to study with DLCO 45 % corrects to 75 % for alv volume  - 07/12/17 Changed prn abx to cipro 750 bid x 10 days as doxy no longer effective  - 03/03/2018 After extensive coaching inhaler device, effectiveness = 75% (short Ti) - 09/27/2018 changed prn abx to omnicef 300 mg bid x 7 days based on previous repsonse and sputum growing e coli 03/31/2018  02/14/2020 Follow up : Bronchiectasis  Patient returns for a 2-week follow-up.  Patient was seen last visit via telemedicine visit.  She was having increased cough congestion and shortness of breath.  She was given a Z-Pak and a prednisone taper.  Patient says she is starting to feel better.  Patient says that she felt much improved on the steroids.  However once she stopped taking the steroids within a day or 2 she started to notice her breathing was not as good.  She was instructed to take some Medrol 4 mg tablets that  was available to her from a family member.  She says over the last few days she is felt the best that she has felt in a long time.  Her oxygen levels have been better her breathing has been much better. Chest x-ray today shows chronic diffuse interstitial opacities possibly slightly increased in the right lung base. Patient denies any discolored mucus, fever, hemoptysis.  She is currently on Medrol 4 mg daily.  She is using budesonide nebulizer once daily.  And Brovana nebulizer twice daily. She remains on oxygen 2-1/2 L with activity and at bedtime.  Lab work last visit showed a negative D-dimer, QuantiFERON gold, Sed rate was elevated at 51.  TSH, CBC, BNP were normal. Allergies  Allergen Reactions  . Pentoxifylline Other (See Comments) and Shortness Of Breath    Chest tightness and speech problem   . Amoxicillin     REACTION: hives  . Avelox [Moxifloxacin Hcl In Nacl] Itching  . Iron     Per pt can only tolerate iron injection  . Levaquin [Levofloxacin In D5w] Nausea Only  . Other Other (See Comments)  . Statins Other (See Comments)    Joint pain  . Sulfa Antibiotics     Unknown reaction  . Symbicort [Budesonide-Formoterol Fumarate]     Pt reports she cannot take this d/t having glaucoma.  Has tried this and it affected eyes (eye pain).  Immunization History  Administered Date(s) Administered  . Influenza Split 11/07/2012, 11/08/2013, 10/01/2014  . Influenza Whole 10/25/2009, 10/26/2011, 10/26/2015  . Influenza, High Dose Seasonal PF 10/07/2014, 10/13/2015, 11/12/2016, 10/21/2017, 11/24/2018  . Influenza-Unspecified 11/07/2012, 11/08/2013, 10/01/2014, 11/12/2019  . Janssen (J&J) SARS-COV-2 Vaccination 04/04/2019  . Pneumococcal Conjugate-13 08/07/2013, 10/08/2013  . Pneumococcal Polysaccharide-23 04/07/2009  . Tdap 07/08/2010  . Zoster 09/19/2007, 05/10/2011    Past Medical History:  Diagnosis Date  . Allergic rhinitis   . Bronchiectasis    CT 11/07/08: bronchiectasis  in the lingular > rml plus small HH.  Sinus CT ne 04/17/09.  HFA 25>75% effective April 15, 2009  . GERD (gastroesophageal reflux disease)   . Hypothyroidism   . Osteoarthritis   . Pneumonia   . Pulmonary hemorrhage 1979    Tobacco History: Social History   Tobacco Use  Smoking Status Never Smoker  Smokeless Tobacco Never Used   Counseling given: Not Answered   Outpatient Medications Prior to Visit  Medication Sig Dispense Refill  . BIOTIN PO Take 1,000 mg by mouth daily.    Marland Kitchen BROVANA 15 MCG/2ML NEBU USE 1 VIAL  IN  NEBULIZER TWICE  DAILY - Morning and Evening 2 mL 11  . budesonide (PULMICORT) 0.5 MG/2ML nebulizer solution One each am    . cefdinir (OMNICEF) 300 MG capsule Take 1 capsule (300 mg total) by mouth 2 (two) times daily. 14 capsule 11  . Cholecalciferol (VITAMIN D-3) 5000 units TABS Take 1 tablet by mouth daily.    . famotidine (PEPCID) 20 MG tablet TAKE ONE TABLET BY MOUTH EVERY DAY AT BEDTIME 30 tablet 11  . levothyroxine (SYNTHROID, LEVOTHROID) 50 MCG tablet Take by mouth daily.    . Melatonin 10 MG TABS Take 1 tablet by mouth at bedtime.    . OXYGEN 2 lpm with sleep and as needed during the day    . pantoprazole (PROTONIX) 40 MG tablet Take 1 tablet (40 mg total) by mouth daily. Take 30-60 min before first meal of the day 30 tablet 5  . Prenatal Vit-Fe Sulfate-FA (PRENATAL MULTIVIT-IRON PO) Take 1 tablet by mouth daily.    Marland Kitchen PROAIR HFA 108 (90 Base) MCG/ACT inhaler Inhale 2 puffs into the lungs every 4 (four) hours as needed for Wheezing. 8.5 g 11  . Probiotic CAPS Take 1 capsule by mouth daily.    Marland Kitchen Respiratory Therapy Supplies (FLUTTER) DEVI Use as directed 1 each 0  . triamcinolone cream (KENALOG) 0.1 % Apply 1 fingertip amount to itching/red areas once daily. 30 g 0  . verapamil (CALAN-SR) 120 MG CR tablet Take 120 mg by mouth at bedtime.  3  . azithromycin (ZITHROMAX) 250 MG tablet Take 2 on day one then 1 daily x 4 days 6 tablet 1  . clotrimazole (CLOTRIMAZOLE  ANTI-FUNGAL) 1 % cream Apply 1 application topically daily. 30 g 0  . Magnesium 250 MG TABS Take 1 tablet by mouth daily. (Patient not taking: Reported on 02/14/2020)    . predniSONE (DELTASONE) 10 MG tablet Take  4 each am x 2 days,   2 each am x 2 days,  1 each am x 2 days and stop 14 tablet 0   No facility-administered medications prior to visit.     Review of Systems:   Constitutional:   No  weight loss, night sweats,  Fevers, chills,  +fatigue, or  lassitude.  HEENT:   No headaches,  Difficulty swallowing,  Tooth/dental problems, or  Sore throat,  No sneezing, itching, ear ache, nasal congestion, post nasal drip,   CV:  No chest pain,  Orthopnea, PND, swelling in lower extremities, anasarca, dizziness, palpitations, syncope.   GI  No heartburn, indigestion, abdominal pain, nausea, vomiting, diarrhea, change in bowel habits, loss of appetite, bloody stools.   Resp:    No chest wall deformity  Skin: no rash or lesions.  GU: no dysuria, change in color of urine, no urgency or frequency.  No flank pain, no hematuria   MS:  No joint pain or swelling.  No decreased range of motion.  No back pain.    Physical Exam  BP 136/72 (BP Location: Left Arm, Cuff Size: Normal)   Pulse 74   Temp (!) 97.4 F (36.3 C)   Ht 5\' 5"  (1.651 m)   Wt 99 lb (44.9 kg)   SpO2 96%   BMI 16.47 kg/m   GEN: A/Ox3; pleasant , NAD, elderly on oxygen, rolling walker   HEENT:  [place]/AT,  EACs-clear, TMs-wnl, NOSE-clear, THROAT-clear, no lesions, no postnasal drip or exudate noted.   NECK:  Supple w/ fair ROM; no JVD; normal carotid impulses w/o bruits; no thyromegaly or nodules palpated; no lymphadenopathy.    RESP  Clear  P & A; w/o, wheezes/ rales/ or rhonchi. no accessory muscle use, no dullness to percussion  CARD:  RRR, no m/r/g, no  peripheral edema, pulses intact, no cyanosis or clubbing.  GI:   Soft & nt; nml bowel sounds; no organomegaly or masses detected.   Musco: Warm bil,  no deformities or joint swelling noted.   Neuro: alert, no focal deficits noted.    Skin: Warm, no lesions or rashes    Lab Results:  CBC    Component Value Date/Time   WBC 5.6 01/28/2020 1004   RBC 4.37 01/28/2020 1004   HGB 13.5 01/28/2020 1004   HCT 40.7 01/28/2020 1004   PLT 272.0 01/28/2020 1004   MCV 93.1 01/28/2020 1004   MCHC 33.2 01/28/2020 1004   RDW 14.4 01/28/2020 1004   LYMPHSABS 0.7 01/28/2020 1004   MONOABS 0.5 01/28/2020 1004   EOSABS 0.1 01/28/2020 1004   BASOSABS 0.1 01/28/2020 1004    BMET    Component Value Date/Time   NA 137 01/28/2020 1004   K 4.1 01/28/2020 1004   CL 100 01/28/2020 1004   CO2 28 01/28/2020 1004   GLUCOSE 84 01/28/2020 1004   BUN 14 01/28/2020 1004   CREATININE 0.63 01/28/2020 1004   CALCIUM 10.1 01/28/2020 1004   GFRNONAA 57 01/12/2007 0000   GFRAA 69 01/12/2007 0000    BNP No results found for: BNP  ProBNP    Component Value Date/Time   PROBNP 45.0 01/28/2020 1004    Imaging: DG Chest 2 View  Result Date: 02/14/2020 CLINICAL DATA:  Bronchiectasis dyspnea on exertion EXAM: CHEST - 2 VIEW COMPARISON:  Chest radiograph January 28, 2020 and chest CT July 21, 2019 FINDINGS: The heart size and mediastinal contours are within normal limits. Pulmonary hyperinflation, similar prior. There is chronic diffuse reticulonodular interstitial opacities with perihilar and bilateral lower lung chronic bronchiectasis. Slightly increased in the right lung base. Chronic thoracic vertebral compression deformity with similar kyphosis. IMPRESSION: 1. Chronic diffuse reticulonodular interstitial opacities with perihilar and bilateral lower lung chronic bronchiectasis. Slightly increased in the right lung base compared to prior. 2. Pulmonary hyperinflation. Electronically Signed   By: July 23, 2019 MD   On: 02/14/2020 14:41   DG Chest 2 View  Result Date: 01/28/2020  CLINICAL DATA:  Bronchiectasis EXAM: CHEST - 2 VIEW COMPARISON:  09/27/2019,  07/21/2019 FINDINGS: Stable hyperinflation and chronic diffuse reticulonodular interstitial opacities with perihilar and bilateral lower lung chronic bronchiectasis. This is better demonstrated by comparison CT. No superimposed new acute airspace process, collapse or consolidation. No edema pattern, effusion or pneumothorax. Stable heart size and vascularity. Trachea midline. Aorta atherosclerotic. Chronic compression fractures of the thoracic spine as before with increased kyphosis. IMPRESSION: Stable chest exam without interval change or acute process. Chronic lung disease changes and bronchiectasis again noted. Chronic thoracic spine compression fractures Aortic Atherosclerosis (ICD10-I70.0). Electronically Signed   By: Judie PetitM.  Shick M.D.   On: 01/28/2020 09:41      PFT Results Latest Ref Rng & Units 07/12/2017 07/28/2015 07/31/2012  FVC-Pre L 1.11 1.36 -  FVC-Predicted Pre % 58 69 64  FVC-Post L 1.11 1.43 1.39  FVC-Predicted Post % 58 72 65  Pre FEV1/FVC % % 59 58 58  Post FEV1/FCV % % 58 57 60  FEV1-Pre L 0.65 0.79 0.78  FEV1-Predicted Pre % 48 54 50  FEV1-Post L 0.65 0.82 0.83  DLCO uncorrected ml/min/mmHg 9.59 10.07 11.62  DLCO UNC% % 45 48 55  DLCO corrected ml/min/mmHg - 11.17 -  DLCO COR %Predicted % - 53 -  DLVA Predicted % 75 83 86  TLC L 5.19 4.85 3.82  TLC % Predicted % 110 103 81  RV % Predicted % 155 146 120    No results found for: NITRICOXIDE      Assessment & Plan:   Obstructive bronchiectasis (HCC) Recent bronchiectatic exacerbation.  Improved after steroids and antibiotics.  Patient remains on low-dose steroids.  Says that she feels the best that she has felt in a very long time.  Long discussion regarding chronic steroid use especially in bronchiectasis as with possible underlying MAI, drug resistance and immunosuppression.  Patient education was given.  For now patient may remain on Medrol 4 mg daily.  On return visit in 4 to 6 weeks we will reevaluate and hopefully  decrease dose and taper to off Discussed setting up for high-resolution CT chest to evaluate lung parenchyma more closely however patient declines at this time states she feels so much better she does not want to undergo any additional testing at this time. Cough and congestion have decreased.  If this returns and increases consider a repeat sputum culture with AFB.  Plan  Patient Instructions  Call back if you change your mind on CT scan of your lungs  Continue on Oxygen 2.5l/m with activity and At bedtime   Mucinex DM .Twice daily  As needed  Cough/congestion.  Flutter valve Three times a day  Continue on Budesonide Neb daily  Continue on AssurantBrovana Neb Twice daily   Continue on Medrol 4mg  daily.  Follow up with Dr. 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  Hold off on high-resolution CT chest at this time.  If cough and congestion increased.  Consider repeat sputum for AFB and sputum culture     Rubye Oaksammy Kendall Justo, NP 02/14/2020

## 2020-02-15 ENCOUNTER — Telehealth: Payer: Self-pay | Admitting: Internal Medicine

## 2020-02-15 MED ORDER — DEXAMETHASONE 6 MG PO TABS: 6.0000 mg | ORAL_TABLET | Freq: Every day | ORAL | 0 refills | Status: DC

## 2020-02-15 NOTE — Telephone Encounter (Signed)
Can try dexamethasone 6 mg daily x 7 days then regroup with televisit with me next week as I'm in [place],  To ER in meantime if a lot worse as nothing else to offer over the phone

## 2020-02-15 NOTE — Telephone Encounter (Signed)
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  Hold off on high-resolution CT chest at this time.  If cough and congestion increased.  Consider repeat sputum for AFB and sputum culture       Assessment & Plan Note by Julio Sicks, NP at 02/14/2020 3:44 PM  Author: Julio Sicks, NP Author Type: Nurse Practitioner Filed: 02/14/2020 3:44 PM  Note Status: Written Cosign: Cosign Not Required Encounter Date: 02/14/2020  Problem: Chronic respiratory failure with hypoxia (HCC)  Editor: Julio Sicks, NP (Nurse Practitioner)               continue on oxygen 2-1/2 L with activity and at bedtime       Assessment & Plan Note by Julio Sicks, NP at 02/14/2020 3:43 PM  Author: Julio Sicks, NP Author Type: Nurse Practitioner Filed: 02/14/2020 3:44 PM  Note Status: Written Cosign: Cosign Not Required Encounter Date: 02/14/2020  Problem: Obstructive bronchiectasis (HCC)  Editor: Julio Sicks, NP (Nurse Practitioner)               Recent bronchiectatic exacerbation.  Improved after steroids and antibiotics.  Patient remains on low-dose steroids.  Says that she feels the best that she has felt in a very long time.  Long discussion regarding chronic steroid use especially in bronchiectasis as with possible underlying MAI, drug resistance and immunosuppression.  Patient education was given.  For now patient may remain on Medrol 4 mg daily.  On return visit in 4 to 6 weeks we will reevaluate and hopefully decrease dose and taper to off Discussed setting up for high-resolution CT chest to evaluate lung parenchyma more  closely however patient declines at this time states she feels so much better she does not want to undergo any additional testing at this time. Cough and congestion have decreased.  If this returns and increases consider a repeat sputum culture with AFB.  Plan  Patient Instructions  Call back if you change your mind on CT scan of your lungs  Continue on Oxygen 2.5l/m with activity and At bedtime   Mucinex DM .Twice daily  As needed  Cough/congestion.  Flutter valve Three times a day  Continue on Budesonide Neb daily  Continue on [employer]urant Twice daily   Continue on Medrol 4mg  daily.  Follow up with Dr.  In 6 weeks and As needed   Please contact office for sooner follow up if symptoms do not improve or worsen or seek emergency care              Patient Instructions by Sherene Sires, NP at 02/14/2020 2:30 PM  Author: 02/16/2020, NP Author Type: Nurse Practitioner Filed: 02/14/2020 3:00 PM  Note Status: Addendum Cosign: Cosign Not Required Encounter Date: 02/14/2020  Editor: 02/16/2020, NP (Nurse Practitioner)      Prior Versions: 1. Parrett, Julio Sicks, NP (Nurse Practitioner) at 02/14/2020 2:58 PM - Signed    Call back if you change your mind on CT scan of your lungs  Continue on Oxygen 2.5l/m with activity and  At bedtime   Mucinex DM .Twice daily  As needed  Cough/congestion.  Flutter valve Three times a day  Continue on Budesonide Neb daily  Continue on [employer]urant Twice daily   Continue on Medrol 4mg  daily.  Follow up with Dr.  In 6 weeks and As needed   Please contact office for sooner follow up if symptoms do not improve or worsen or seek emergency care            Instructions  Call back if you change your mind on CT scan of your lungs  Continue on Oxygen 2.5l/m with activity and At bedtime   Mucinex DM .Twice daily  As needed  Cough/congestion.  Flutter valve Three times a day  Continue on Budesonide Neb daily  Continue on  Sherene Sires Twice daily   Continue on Medrol 4mg  daily.  Follow up with Dr. Assurant  In 6 weeks and As needed   Please contact office for sooner follow up if symptoms do not improve or worsen or seek emergency care      Patient was seen in the office yesterday by Tammy and put on Methylprednisone 4mg  daily.  She took her first dose today and her daughter reports that she is shaky, nervous and beside herself.  Her daughter is requesting to have her medication changed to Dexamethazone because it has worked well for her in the past.  Dr. , please advise.  Thank you.

## 2020-02-15 NOTE — Telephone Encounter (Signed)
I have called and spoke with pts daughter and she is aware of meds that have been sent to the pharmacy.  televisit has been scheduled for 1/26 at 915 and the pts daughter is aware of this as well.

## 2020-02-19 ENCOUNTER — Other Ambulatory Visit: Payer: Self-pay

## 2020-02-19 ENCOUNTER — Telehealth: Payer: Self-pay | Admitting: Internal Medicine

## 2020-02-19 ENCOUNTER — Encounter: Payer: Self-pay | Admitting: Internal Medicine

## 2020-02-19 ENCOUNTER — Ambulatory Visit (INDEPENDENT_AMBULATORY_CARE_PROVIDER_SITE_OTHER): Payer: Medicare Other | Admitting: Internal Medicine

## 2020-02-19 DIAGNOSIS — J479 Bronchiectasis, uncomplicated: Secondary | ICD-10-CM

## 2020-02-19 DIAGNOSIS — J9611 Chronic respiratory failure with hypoxia: Secondary | ICD-10-CM | POA: Diagnosis not present

## 2020-02-19 MED ORDER — PREDNISONE 10 MG PO TABS: ORAL_TABLET | ORAL | 0 refills | Status: DC

## 2020-02-19 NOTE — Addendum Note (Signed)
Addended by: Benjie Karvonen R on: 02/19/2020 03:04 PM   Modules accepted: Orders

## 2020-02-19 NOTE — Assessment & Plan Note (Signed)
03/28/13 Qualification  Patient Saturations on Room Air at Rest = 93% Patient Saturations on [employer] while Ambulating = 88% Patient Saturations on 2 Liters of oxygen while Ambulating = 97% - 10/22/2014  Walked RA x 2 laps @ 185 ft each stopped due sob at slow pace but sats still 90%   - 07/28/2015  Walked RA x 3 laps @ 185 ft each stopped due to  End of study, nl pace, no sob or desat   - 12/16 21 Patient Saturations on Room Air at Rest = 98% Patient Saturations on Room Air while Ambulating = 87% Patient Saturations on 2 pulse dose Liters of oxygen while Ambulating = 94%   as of 02/19/2020  Using 2.5 lpm and sats no lower than 94% even with ambulation so no change rx needed   Each maintenance medication was reviewed in detail including most importantly the difference between maintenance and as needed and under what circumstances the prns are to be used.  Please see AVS for specific  Instructions which are unique to this visit and I personally typed out  which were reviewed in detail over the phone with the patient and a copy provided via MyChart

## 2020-02-19 NOTE — Progress Notes (Signed)
Subjective:    Patient ID: Lacey Stanton, female    DOB: 07-Aug-1926   MRN: [id number]   Brief patient profile:  52  yowf never smoker/MM with obstructive  bronchiectasis with resp symptoms  of cough/sob dating back to  the 1980's   History of Present Illness  06/29/2012 f/u ov/Lacey Stanton re bronchiectasis on 02 just at hs and bud/brovana bid maint rx Chief Complaint  Patient presents with  . Follow-up    Pt c/o increased SOB for the past month. She feels that nebs are not lasting long enough any longer.   at baseline maintaining brovana budesonide and rarely needing saba hfa Then one month pta fever no cough but sob and increased proaire and used 02  > ov with Dr Martha Clan rx Avelox and fever resolved but not breathing to her satisfaction and using proaire 2 x daily but no neb saba. Doe x > slow adls rec increase zantac (ranitidine) to 150 mg after bfast and after supper  GERD  diet Continue to use the nebulizer brovana and budesonide and then 12 hours later. Prednisone 10 mg take  4 each am x 2 days,   2 each am x 2 days,  1 each am x2days and stop  Work on inhaler technique    Only use your albuterol (proaire) as rescue  02/06/2013 f/u ov/Lacey Stanton re: bronchiectasis on maint brovana/ bud plus prn saba hfa Chief Complaint  Patient presents with  . Follow-up    Pt states doing well and denies any new co's today.   typically more congested after lunch and feels her self wheezing so uses albuterol but not really helping much and  not using mucinex much  At all nor the flutter. Not limited by breathing though from desired activities rec Pantoprazole (protonix) 40 mg   Take 30-60 min before first meal of the day and Zantac 150  mg one @ bedtime until return to office GERD diet For cough > flutter valve and mucinex or mucinex dm up 1200 mg every 12hours  Relax and do purse lip breath breathing as much as possible.     10/18/2017  f/u ov/Lacey Stanton re: bronchiectasis Chief Complaint  Patient presents  with  . Follow-up    Breathing is "okay". She states still coughing up yellow sputum.  She is using her albuterol inhaler 2 x per wk.   Dyspnea:  MMRC2 = can't walk a nl pace on a flat grade s sob but does fine slow and flat / slowed by coughing Cough: minimal yellow esp am  Sleeping: ok on back 2 pillows / bed flat  SABA use: twice a week 02 2lpm hs / rarely uses during the day  rec Plan A = Automatic =  Brovana / Budesonide twice daily  Plan B = Backup for breathing/ congestion Only use your albuterol as a rescue medication Plan C = Crisis - only use your albuterol nebulizer if you first try Plan B and it fails to help > ok to use the nebulizer up to every 4 hours but if start needing it regularly call for immediate appointment For cough / congestion > mucinex dm up to 1200 mg every 12 hours and use the flutter valve as much as possible  Prednisone 10 mg take  4 each am x 2 days,   2 each am x 2 days,  1 each am x 2 days and stop       03/03/2018  f/u ov/Lacey Stanton re: obstructive bronchiectasis  Chief  Complaint  Patient presents with  . Follow-up    Pepcid not working, still having colored mucus, oxygen levels dropping even at rest, on oxygen at night, SOB  Dyspnea: 2 wheeled walker / mailbox 50 ft flat is her limit = MMRC3 = can't walk 100 yards even at a slow pace at a flat grade s stopping due to sob   Cough: about the same/ cipro really didn't help but can't tol avelox and pcn allergic per records/ not using flutter or mucinex as rec  Sleeping: bed flat/ 3 pillows SABA use: 3-4 x per week 02: 2lpm hs rec  Plan A = Automatic =  Brovana / Budesonide 1st thing in am and 12 hours later Plan B = Backup for breathing/ congestion Only use your albuterol as a rescue medication Work on inhaler technique:   Plan C = Crisis - only use your albuterol nebulizer if you first try Plan B  For cough / congestion > mucinex dm up to 1200 mg every 12 hours and use the flutter valve as much as  possible  Prednisone 10 mg take  4 each am x 2 days,   2 each am x 2 days,  1 each am x 2 days and stop  Zpak   Sputum culture 03/31/18  E coli > rx omnicef x 7 days > much better     08/09/2018  f/u ov/Lacey Stanton re: bronchiectasis on brovana/bud bid  Confused with details of care, did not bring new med calendar but says "best she's been in a while"  Chief Complaint  Patient presents with  . Other    Bronchiectasis - patient has been doing well since last visit.Medications have been working.  Dyspnea:  Walk in hallways at home and mailbox with a cane or walker  Cough: some in am dark but white  Sleeping:  1- 2 pillows but bed is flat  SABA use: not needing  02: 2lpm hs and famotidine but still with breakthru sense of hb/ "Chest congestion " at hs rec Pepcid hs   09/27/2018 televisit Omnicef 300 mg twice daily for 7 days as needed for nasty mucus - add this to your action plan at bottom of your med calendar  If 02 sats drops from mid 90s at rest on room air you will need to be evaluated in ER  We can't assume this is not COVID -19 so you need to isolate for the next 13 days and any contact will need to isolate with the other option being you get tested now or they get tested in 5 days but if any of your contacts get sick in the next week I would assume everyone has COVID as it's too soon for flu season.  See calendar for specific medication instructions     02/13/2019  f/u ov/Lacey Stanton re: bronchiectasis  Chief Complaint  Patient presents with  . Follow-up    Breathing is overall doing well. She is coughing more at night- producing light yellow to clear sputum.   Dyspnea:  Cane at home mb and back slow pace does ok  Cough: at hs / minimally discolored, much better p omnicef x 7d  SABA use: none needed 02: 2lpm  Hs but not needed during the day  rec For night time cough take either mucinex dm or your over the counter   Chlorpheniramine 4 mg at bedime  Take the covid vaccine as soon as it is  offered> [facility] April 04 2019  08/09/2019  f/u ov/Lacey Stanton re: obstructive bronchiectasison brovana/bud bid  post admit to [facility] June 26 21 x 48h Chief Complaint  Patient presents with  . Follow-up    shortness of breath with exertion  Dyspnea:  Walking around appt complex  Cough: minimal clear  Sleeping: able to sleep ok bed blocks/ 2 pillows  SABA use once a month 02: 2lpm HS  rec Take omnicef if needed    09/27/2019  f/u ov/Laureen Frederic re: obstructive bronchiectasis/ brova bud no recent omnicef needed  Chief Complaint  Patient presents with  . Follow-up  Dyspnea:  Not going outside now, walking around appt with walker with sats 91% and breathing better  Cough: yellow in ams then clears  Sleeping: bed block / 2 pillows  SABA use: rarely  02: 2lpm hs  L > R swelling not resolving overnight > echo done and ok  rec I recommend a diuretic called aldactazide 25-25 but will defer this to Dr Martha Clan      01/22/20 televisit re worse doe/desats  I very strongly recommend you get the moderna or pfizer vaccine   We will order your Portable concentrator now. Make sure you check your oxygen saturation  at your highest level of activity  to be sure it stays over 90% and adjust  02 flow upward  For cough / congestion> mucinex 1200 mg every 12 hours and flutter valve as much as possible  We need to get you back for a cxr and visit with me or one of our NP's asap to sort out why your 02 levels are dropping.    01/28/2020 acute ov/Dawnyel Leven re: obstructive bronchiectasis  - sob worse x 4 weeks p one episode N and V but no additional episodes  Chief Complaint  Patient presents with  . Follow-up    Obstructive bronchiectasis  Dyspnea:  Walking to living room daily o/w very little activity  Cough: can't tolerate mucinex 600 ok on mucinex junior/ minimal am yellow production/ using flutter valve/ has not used omnicef since onset of worse sob   Sleeping:  30-45 degrees x months SABA use: avg use once a day   02: 2lpm 24/7 does not titrate  rec Make sure you check your oxygen saturation  at your highest level of activity  to be sure it stays over 90% and adjust  02 flow upward to maintain this level if needed but remember to turn it back to previous settings when you stop (to conserve your supply).  Prednisone 10 mg take  4 each am x 2 days,   2 each am x 2 days,  1 each am x 2 days and stop  Zpak x 2 (on the 6th day just take one so 11 days of therapy Decrease Budesonide to once daily - no change on the brovana every 12 hours  Please remember to go to the lab department   for your tests - we will call you with the results when they are available. ? Hrct/ repeat sputum on return.  Better until ran out of prednisone then 2 days later worse dexamthesone 4 mg daily  Until 02/13/19 then medrol rx 02/14/20 x one dose and started dexamethosone 6 mg daily x 02/16/20 and back to baseline   Virtual Visit via Telephone Note 02/19/2020   I connected with Lacey Stanton on 02/19/20 at  9:15 AM EST by telephone and verified that I am speaking with the correct person using two identifiers. Pt is at home and this  call made from my office with no other participants    I discussed the limitations, risks, security and privacy concerns of performing an evaluation and management service by telephone and the availability of in person appointments. I also discussed with the patient that there may be a patient responsible charge related to this service. The patient expressed understanding and agreed to proceed.   History of Present Illness: Much better on dex  Dyspnea:  Room to room about nl pace / distance  Cough: better, mucoid x for first thing in am  Sleeping: 30 degrees  SABA use: hfa use less  02: 2.5 liters 24/7 sats 94% at lowest    No obvious day to day or daytime variability or assoc excess/ purulent sputum or mucus plugs or hemoptysis or cp or chest tightness, subjective wheeze or overt sinus or hb symptoms.     Also denies any obvious fluctuation of symptoms with weather or environmental changes or other aggravating or alleviating factors except as outlined above.   Meds reviewed/ med reconciliation completed     No outpatient medications have been marked as taking for the 02/19/20 encounter (Appointment) with Nyoka Cowden, MD.         Observations/Objective: Some upper airway cough cough/ throat clearing, no conversational sob    Assessment and Plan: See problem list for active a/p's   Follow Up Instructions: See avs for instructions unique to this ov which includes revised/ updated med list     I discussed the assessment and treatment plan with the patient. The patient was provided an opportunity to ask questions and all were answered. The patient agreed with the plan and demonstrated an understanding of the instructions.   The patient was advised to call back or seek an in-person evaluation if the symptoms worsen or if the condition fails to improve as anticipated.  I provided 25  minutes of non-face-to-face time during this encounter.   Sandrea Hughs, MD

## 2020-02-19 NOTE — Patient Instructions (Signed)
Prednisone 10 mg take 2 daily until better, then 1 daily x 1 week then one-half daily   Keep the candy handy / ice/ sips

## 2020-02-19 NOTE — Telephone Encounter (Signed)
Called pharmacy to let them know that new RX has been sent over with instructions. They expressed understanding and said if they need anything they will call back. Nothing further needed at this time.

## 2020-02-19 NOTE — Assessment & Plan Note (Signed)
Onset of symptoms 1980s - CT 11/07/08: Bronchiectasis in the lingula  > rml plus small HH  - Sinus CT neg 04/17/09   -PFT's 09/16/2010   FEV1  .89( 55%)  Ratio 49  No better with B2,  DLCO 62 > improved to 107 - PFTs  08/10/2012    FEV1  0.78 (50%) ratio 58,  No better p B2  DLCO 55 improved to 86% - Alpha one AT phenotype 07/07/10: MM CT chest>   03/16/13 bronchiectasis with nodules - Spirometry 02/10/2015  FEV1  0.54 (33%) ratio 53   - PFT's  07/28/2015  FEV1 0.82 (56 % ) ratio 57  p 3 % improvement from saba p laba/ics neb  prior to study with DLCO  48/53 % corrects to 83 % for alv volume   - 07/02/16 rec doxy prn flares  - PFT's  07/12/2017  FEV1 0.65 (48 % ) ratio 59  p no % improvement from saba p nothing prior to study with DLCO  45 % corrects to 75 % for alv volume   - 07/12/17 Changed prn abx to cipro 750 bid x 10 days as doxy no longer effective  - 03/03/2018  After extensive coaching inhaler device,  effectiveness =    75%  (short Ti) - 09/27/2018 changed prn abx to omnicef 300 mg bid x 7 days based on previous repsonse and sputum growing e coli 03/31/2018     - 01/28/19  Quant Gold TB neg   Refractory cough now ? Etiology, much better repeatedly with prednisone.  The goal with a chronic steroid dependent illness is always arriving at the lowest effective dose that controls the disease/symptoms and not accepting a set "formula" which is based on statistics or guidelines that don't always take into account patient  variability or the natural hx of the dz in every individual patient, which may well vary over time.  For now therefore I recommend the patient maintain 20 mg ceiling and 5 mg floor until returns to office.

## 2020-02-25 ENCOUNTER — Telehealth: Payer: Self-pay | Admitting: Internal Medicine

## 2020-02-25 NOTE — Telephone Encounter (Signed)
She can certainly do a trial and error approach but I would start with half a dose of brovana first as it is much more likely to be the culprit

## 2020-02-25 NOTE — Telephone Encounter (Signed)
Called and spoke with Elease Hashimoto and notified of response per MW  She verbalized understanding  Nothing further needed

## 2020-02-25 NOTE — Telephone Encounter (Signed)
Spoke with the pt's daughter Elease Hashimoto  She states that despite taking the budesonide 0.5 mg only once daily in the am now, she is still feeling shaky  She uses this in the am with the brovana and feels very shaky until about 4 pm and then it wears off  She is using her albuterol inhaler maybe a couple times a day, but does not feel that this contributed to shaking  Elease Hashimoto asking about possibly lowering the dose to the 0.25 mg vials  Please advise thanks!

## 2020-03-12 DIAGNOSIS — R627 Adult failure to thrive: Secondary | ICD-10-CM | POA: Insufficient documentation

## 2020-03-28 ENCOUNTER — Other Ambulatory Visit: Payer: Self-pay

## 2020-03-28 ENCOUNTER — Encounter: Payer: Self-pay | Admitting: Internal Medicine

## 2020-03-28 ENCOUNTER — Ambulatory Visit (INDEPENDENT_AMBULATORY_CARE_PROVIDER_SITE_OTHER): Payer: Medicare Other | Admitting: Internal Medicine

## 2020-03-28 DIAGNOSIS — J479 Bronchiectasis, uncomplicated: Secondary | ICD-10-CM

## 2020-03-28 DIAGNOSIS — J9611 Chronic respiratory failure with hypoxia: Secondary | ICD-10-CM

## 2020-03-28 MED ORDER — PREDNISONE 10 MG PO TABS: ORAL_TABLET | ORAL | 0 refills | Status: AC

## 2020-03-28 NOTE — Patient Instructions (Addendum)
Make sure you check your oxygen saturation  at your highest level of activity  to be sure it stays over 90% and adjust  02 flow upward to maintain this level if needed but remember to turn it back to previous settings when you stop (to conserve your supply).    I very strongly recommend you get the moderna or pfizer vaccine as soon as possible based on your risk of dying from the virus  and the proven safety and benefit of these vaccines against even the delta and omicron variants.      Please schedule a follow up visit in 6 months but call sooner if needed

## 2020-03-28 NOTE — Assessment & Plan Note (Addendum)
Onset of symptoms 1980s - CT 11/07/08: Bronchiectasis in the lingula  > rml plus small HH  - Sinus CT neg 04/17/09   -PFT's 09/16/2010   FEV1  .89( 55%)  Ratio 49  No better with B2,  DLCO 62 > improved to 107 - PFTs  08/10/2012    FEV1  0.78 (50%) ratio 58,  No better p B2  DLCO 55 improved to 86% - Alpha one AT phenotype 07/07/10: MM CT chest>   03/16/13 bronchiectasis with nodules - Spirometry 02/10/2015  FEV1  0.54 (33%) ratio 53   - PFT's  07/28/2015  FEV1 0.82 (56 % ) ratio 57  p 3 % improvement from saba p laba/ics neb  prior to study with DLCO  48/53 % corrects to 83 % for alv volume   - 07/02/16 rec doxy prn flares  - PFT's  07/12/2017  FEV1 0.65 (48 % ) ratio 59  p no % improvement from saba p nothing prior to study with DLCO  45 % corrects to 75 % for alv volume   - 07/12/17 Changed prn abx to cipro 750 bid x 10 days as doxy no longer effective  - 03/03/2018  After extensive coaching inhaler device,  effectiveness =    75%  (short Ti) - 09/27/2018 changed prn abx to omnicef 300 mg bid x 7 days based on previous repsonse and sputum growing e coli 03/31/2018     - 01/28/19  Quant Gold TB neg  - 02/19/20 started daily pred with ceiling 20 mg and floor 5 mg daily plus reduced brovana to one half vial bid due to tremor from full strength > improved 03/28/2020   Adequate control on present rx, reviewed in detail with pt > no change in rx needed

## 2020-03-28 NOTE — Progress Notes (Signed)
Subjective:   Patient ID: Lacey Stanton, female    DOB: 12-26-1926   MRN: [id number]   Brief patient profile:  42  yowf never smoker/MM with obstructive  bronchiectasis with resp symptoms  of cough/sob dating back to  the 1980's   History of Present Illness  06/29/2012 f/u ov/Lacey Stanton re bronchiectasis on 02 just at hs and bud/brovana bid maint rx Chief Complaint  Patient presents with  . Follow-up    Pt c/o increased SOB for the past month. She feels that nebs are not lasting long enough any longer.   at baseline maintaining brovana budesonide and rarely needing saba hfa Then one month pta fever no cough but sob and increased proaire and used 02  > ov with Dr Martha Clan rx Avelox and fever resolved but not breathing to her satisfaction and using proaire 2 x daily but no neb saba. Doe x > slow adls rec increase zantac (ranitidine) to 150 mg after bfast and after supper  GERD  diet Continue to use the nebulizer brovana and budesonide and then 12 hours later. Prednisone 10 mg take  4 each am x 2 days,   2 each am x 2 days,  1 each am x2days and stop  Work on inhaler technique    Only use your albuterol (proaire) as rescue  02/06/2013 f/u ov/Lacey Stanton re: bronchiectasis on maint brovana/ bud plus prn saba hfa Chief Complaint  Patient presents with  . Follow-up    Pt states doing well and denies any new co's today.   typically more congested after lunch and feels her self wheezing so uses albuterol but not really helping much and  not using mucinex much  At all nor the flutter. Not limited by breathing though from desired activities rec Pantoprazole (protonix) 40 mg   Take 30-60 min before first meal of the day and Zantac 150  mg one @ bedtime until return to office GERD diet For cough > flutter valve and mucinex or mucinex dm up 1200 mg every 12hours  Relax and do purse lip breath breathing as much as possible.     10/18/2017  f/u ov/Lacey Stanton re: bronchiectasis Chief Complaint  Patient presents  with  . Follow-up    Breathing is "okay". She states still coughing up yellow sputum.  She is using her albuterol inhaler 2 x per wk.   Dyspnea:  MMRC2 = can't walk a nl pace on a flat grade s sob but does fine slow and flat / slowed by coughing Cough: minimal yellow esp am  Sleeping: ok on back 2 pillows / bed flat  SABA use: twice a week 02 2lpm hs / rarely uses during the day  rec Plan A = Automatic =  Brovana / Budesonide twice daily  Plan B = Backup for breathing/ congestion Only use your albuterol as a rescue medication Plan C = Crisis - only use your albuterol nebulizer if you first try Plan B and it fails to help > ok to use the nebulizer up to every 4 hours but if start needing it regularly call for immediate appointment For cough / congestion > mucinex dm up to 1200 mg every 12 hours and use the flutter valve as much as possible  Prednisone 10 mg take  4 each am x 2 days,   2 each am x 2 days,  1 each am x 2 days and stop       03/03/2018  f/u ov/Lacey Stanton re: obstructive bronchiectasis  Chief Complaint  Patient presents with  . Follow-up    Pepcid not working, still having colored mucus, oxygen levels dropping even at rest, on oxygen at night, SOB  Dyspnea: 2 wheeled walker / mailbox 50 ft flat is her limit = MMRC3 = can't walk 100 yards even at a slow pace at a flat grade s stopping due to sob   Cough: about the same/ cipro really didn't help but can't tol avelox and pcn allergic per records/ not using flutter or mucinex as rec  Sleeping: bed flat/ 3 pillows SABA use: 3-4 x per week 02: 2lpm hs rec  Plan A = Automatic =  Brovana / Budesonide 1st thing in am and 12 hours later Plan B = Backup for breathing/ congestion Only use your albuterol as a rescue medication Work on inhaler technique:   Plan C = Crisis - only use your albuterol nebulizer if you first try Plan B  For cough / congestion > mucinex dm up to 1200 mg every 12 hours and use the flutter valve as much as  possible  Prednisone 10 mg take  4 each am x 2 days,   2 each am x 2 days,  1 each am x 2 days and stop  Zpak   Sputum culture 03/31/18  E coli > rx omnicef x 7 days > much better     08/09/2018  f/u ov/Lacey Stanton re: bronchiectasis on brovana/bud bid  Confused with details of care, did not bring new med calendar but says "best she's been in a while"  Chief Complaint  Patient presents with  . Other    Bronchiectasis - patient has been doing well since last visit.Medications have been working.  Dyspnea:  Walk in hallways at home and mailbox with a cane or walker  Cough: some in am dark but white  Sleeping:  1- 2 pillows but bed is flat  SABA use: not needing  02: 2lpm hs and famotidine but still with breakthru sense of hb/ "Chest congestion " at hs rec Pepcid hs   09/27/2018 televisit Omnicef 300 mg twice daily for 7 days as needed for nasty mucus - add this to your action plan at bottom of your med calendar  If 02 sats drops from mid 90s at rest on room air you will need to be evaluated in ER  We can't assume this is not COVID -19 so you need to isolate for the next 13 days and any contact will need to isolate with the other option being you get tested now or they get tested in 5 days but if any of your contacts get sick in the next week I would assume everyone has COVID as it's too soon for flu season.  See calendar for specific medication instructions     02/13/2019  f/u ov/Lacey Stanton re: bronchiectasis  Chief Complaint  Patient presents with  . Follow-up    Breathing is overall doing well. She is coughing more at night- producing light yellow to clear sputum.   Dyspnea:  Cane at home mb and back slow pace does ok  Cough: at hs / minimally discolored, much better p omnicef x 7d  SABA use: none needed 02: 2lpm  Hs but not needed during the day  rec For night time cough take either mucinex dm or your over the counter   Chlorpheniramine 4 mg at bedime  Take the covid vaccine as soon as it is  offered> J&J April 04 2019     08/09/2019  f/u ov/Lacey Stanton re: obstructive bronchiectasison brovana/bud bid  post admit to [facility] June 26 21 x 48h Chief Complaint  Patient presents with  . Follow-up    shortness of breath with exertion  Dyspnea:  Walking around appt complex  Cough: minimal clear  Sleeping: able to sleep ok bed blocks/ 2 pillows  SABA use once a month 02: 2lpm HS  rec Take omnicef if needed    09/27/2019  f/u ov/Lacey Stanton re: obstructive bronchiectasis/ brova bud no recent omnicef needed  Chief Complaint  Patient presents with  . Follow-up  Dyspnea:  Not going outside now, walking around appt with walker with sats 91% and breathing better  Cough: yellow in ams then clears  Sleeping: bed block / 2 pillows  SABA use: rarely  02: 2lpm hs  L > R swelling not resolving overnight > echo done and ok  rec I recommend a diuretic called aldactazide 25-25 but will defer this to Dr Martha Clan      01/22/20 televisit re worse doe/desats  I very strongly recommend you get the moderna or pfizer vaccine   We will order your Portable concentrator now. Make sure you check your oxygen saturation  at your highest level of activity  to be sure it stays over 90% and adjust  02 flow upward  For cough / congestion> mucinex 1200 mg every 12 hours and flutter valve as much as possible  We need to get you back for a cxr and visit with me or one of our NP's asap to sort out why your 02 levels are dropping.    01/28/2020 acute ov/Lacey Stanton re: obstructive bronchiectasis  - sob worse x 4 weeks p one episode N and V but no additional episodes  Chief Complaint  Patient presents with  . Follow-up    Obstructive bronchiectasis  Dyspnea:  Walking to living room daily o/w very little activity  Cough: can't tolerate mucinex 600 ok on mucinex junior/ minimal am yellow production/ using flutter valve/ has not used omnicef since onset of worse sob   Sleeping:  30-45 degrees x months SABA use: avg use once a day   02: 2lpm 24/7 does not titrate  rec Make sure you check your oxygen saturation  at your highest level of activity  to be sure it stays over 90% and adjust  02 flow upward to maintain this level if needed but remember to turn it back to previous settings when you stop (to conserve your supply).  Prednisone 10 mg take  4 each am x 2 days,   2 each am x 2 days,  1 each am x 2 days and stop  Zpak x 2 (on the 6th day just take one so 11 days of therapy Decrease Budesonide to once daily - no change on the brovana every 12 hours     .    02/19/20 Televisit rec Prednisone 10 mg take 2 daily until better, then 1 daily x 1 week then one-half daily  Keep the candy handy / ice/ sips    03/28/2020  f/u ov/Lacey Stanton re: obst bronchiectasis/ shaking better on brovana half vial bid and much better on pred maint = 5mg  dialy  Chief Complaint  Patient presents with  . Follow-up    Good and bad days  Dyspnea:  Walking thru house now / to mb which is better than prior around 75 ft flat Cough: less congested  - last omnicef was 3-4 weeks prior to OV  / mucus is clear  Sleeping: bed blocks/pillows = 30 degrees SABA use: brovana half strength and does not need hfa  02: 2lpm hs and prn  Covid status:   J&J x one remotely    No obvious day to day or daytime variability or assoc   purulent sputum or mucus plugs or hemoptysis or cp or chest tightness, subjective wheeze or overt sinus or hb symptoms.   Sleeps  without nocturnal  or early am exacerbation  of respiratory  c/o's or need for noct saba. Also denies any obvious fluctuation of symptoms with weather or environmental changes or other aggravating or alleviating factors except as outlined above   No unusual exposure hx or h/o childhood pna/ asthma or knowledge of premature birth.  Current Allergies, Complete Past Medical History, Past Surgical History, Family History, and Social History were reviewed in [facility] electronic medical record.  ROS  The  following are not active complaints unless bolded Hoarseness, sore throat, dysphagia, dental problems, itching, sneezing,  nasal congestion or discharge of excess mucus or purulent secretions, ear ache,   fever, chills, sweats, unintended wt loss or wt gain, classically pleuritic or exertional cp,  orthopnea pnd or arm/hand swelling  or leg swelling, presyncope, palpitations, abdominal pain, anorexia= early satiety, nausea, vomiting, diarrhea  or change in bowel habits or change in bladder habits, change in stools or change in urine, dysuria, hematuria,  rash, arthralgias, visual complaints, headache, numbness, weakness or ataxia or problems with walking or coordination,  change in mood or  memory.        Current Meds  Medication Sig  . BIOTIN PO Take 1,000 mg by mouth daily.  Marland Kitchen. BROVANA 15 MCG/2ML NEBU USE 1 VIAL  IN  NEBULIZER TWICE  DAILY - Morning and Evening  . budesonide (PULMICORT) 0.5 MG/2ML nebulizer solution One each am  . cefdinir (OMNICEF) 300 MG capsule Take 1 capsule (300 mg total) by mouth 2 (two) times daily.  . Cholecalciferol (VITAMIN D-3) 5000 units TABS Take 1 tablet by mouth daily.  . clotrimazole (CLOTRIMAZOLE ANTI-FUNGAL) 1 % cream Apply 1 application topically daily.  . famotidine (PEPCID) 20 MG tablet TAKE ONE TABLET BY MOUTH EVERY DAY AT BEDTIME  . levothyroxine (SYNTHROID, LEVOTHROID) 50 MCG tablet Take by mouth daily.  . Magnesium 250 MG TABS Take 1 tablet by mouth daily.  . Melatonin 10 MG TABS Take 1 tablet by mouth at bedtime.  . OXYGEN 2 lpm with sleep and as needed during the day  . pantoprazole (PROTONIX) 40 MG tablet Take 1 tablet (40 mg total) by mouth daily. Take 30-60 min before first meal of the day  . predniSONE (DELTASONE) 10 MG tablet Take 2 daily until better, then 1 daily for 1 week, then 1/2 tab daily and stay  . Prenatal Vit-Fe Sulfate-FA (PRENATAL MULTIVIT-IRON PO) Take 1 tablet by mouth daily.  Marland Kitchen. PROAIR HFA 108 (90 Base) MCG/ACT inhaler Inhale 2  puffs into the lungs every 4 (four) hours as needed for Wheezing.  . Probiotic CAPS Take 1 capsule by mouth daily.  Marland Kitchen. Respiratory Therapy Supplies (FLUTTER) DEVI Use as directed  . triamcinolone cream (KENALOG) 0.1 % Apply 1 fingertip amount to itching/red areas once daily.  . verapamil (CALAN-SR) 120 MG CR tablet Take 120 mg by mouth at bedtime.                     Past Medical History:  Bronchiectasis  - CT 11/07/08: Bronchiectasis in the lingular > RML plus small  HH  - Sinus CT neg 04/17/09  - HFA 25 > 75% effective April 15, 2009  -PFT's 09/16/2010   FEV1  .89( 55%)  Ratio 49  No better with B2,  DLCO 62 > improved to 107 Pneumonia > cleared radiographically 07/01/2010     - Pneumovax March 2011  Allergic Rhinitis  Pulmonary hemorrhage 1979   Osteoarthritis      - s/p  L TKR 02/2000 >>  Difficult recovery Hypothyroidism  GERD diet       Objective:   Physical Exam  03/28/2020      98   01/28/2020     97  09/27/2019    106   08/09/2019   106   03/03/2018    115  08/09/2018    115  12/05/2017 113     Vital signs reviewed  03/28/2020  - Note at rest 02 sats  94% on RA   General appearance:    amb thin wf nad    Reports full dentures  HEENT : pt wearing mask not removed for exam due to covid - 19 concerns.   NECK :  without JVD/Nodes/TM/ nl carotid upstrokes bilaterally   LUNGS: no acc muscle use,  Min barrel  contour chest wall with bilateral insp crackles/ pops s exp wheeze and  without cough on insp or exp maneuvers and min  Hyperresonant  to  percussion bilaterally     CV:  RRR  no s3 or murmur or increase in P2, and no edema   ABD:  soft and nontender with pos end  insp Hoover's  in the supine position. No bruits or organomegaly appreciated, bowel sounds nl  MS:   Nl gait/  ext warm without deformities, calf tenderness, cyanosis or clubbing  R foot drop wearing brace  SKIN: warm and dry without lesions    NEURO:  alert, approp, nl sensorium with  no motor or  cerebellar deficits apparent.         Assessment & Plan:

## 2020-03-28 NOTE — Assessment & Plan Note (Addendum)
03/28/13 Qualification  Patient Saturations on Room Air at Rest = 93% Patient Saturations on [employer] while Ambulating = 88% Patient Saturations on 2 Liters of oxygen while Ambulating = 97% - 10/22/2014  Walked RA x 2 laps @ 185 ft each stopped due sob at slow pace but sats still 90%   - 07/28/2015  Walked RA x 3 laps @ 185 ft each stopped due to  End of study, nl pace, no sob or desat   - 12/16 21 Patient Saturations on Room Air at Rest = 98% Patient Saturations on Room Air while Ambulating = 87% Patient Saturations on 2 pulse dose Liters of oxygen while Ambulating = 94%  As of 03/28/2020  rec 02 2 lpm hs and titrate daytime to sats > 90%    Also rec:    I strongly recommended pt take Moderna and [employer] products  which have proven both safe and  effective even against the  delta and new omicron variant to prevent hospitalization and death since > 6 m out on J&J    Each maintenance medication was reviewed in detail including emphasizing most importantly the difference between maintenance and prns and under what circumstances the prns are to be triggered using an action plan format where appropriate.  Total time for H and P, chart review, counseling, reviewing neb/02 device(s) and generating customized AVS unique to this office visit / same day charting  > 30 min

## 2020-03-30 IMAGING — DX DG CHEST 2V
2 series · 2 of 2 positions shown · non-contrast
Comparison: Chest x-ray of January 20, 2017

CLINICAL DATA: Worsening dyspnea on exertion. Recently treated with
antibiotics for cough and chest congestion. History of
bronchiectasis, chronic respiratory failure with hypoxia.

EXAM:
CHEST - 2 VIEW

[chest pa]
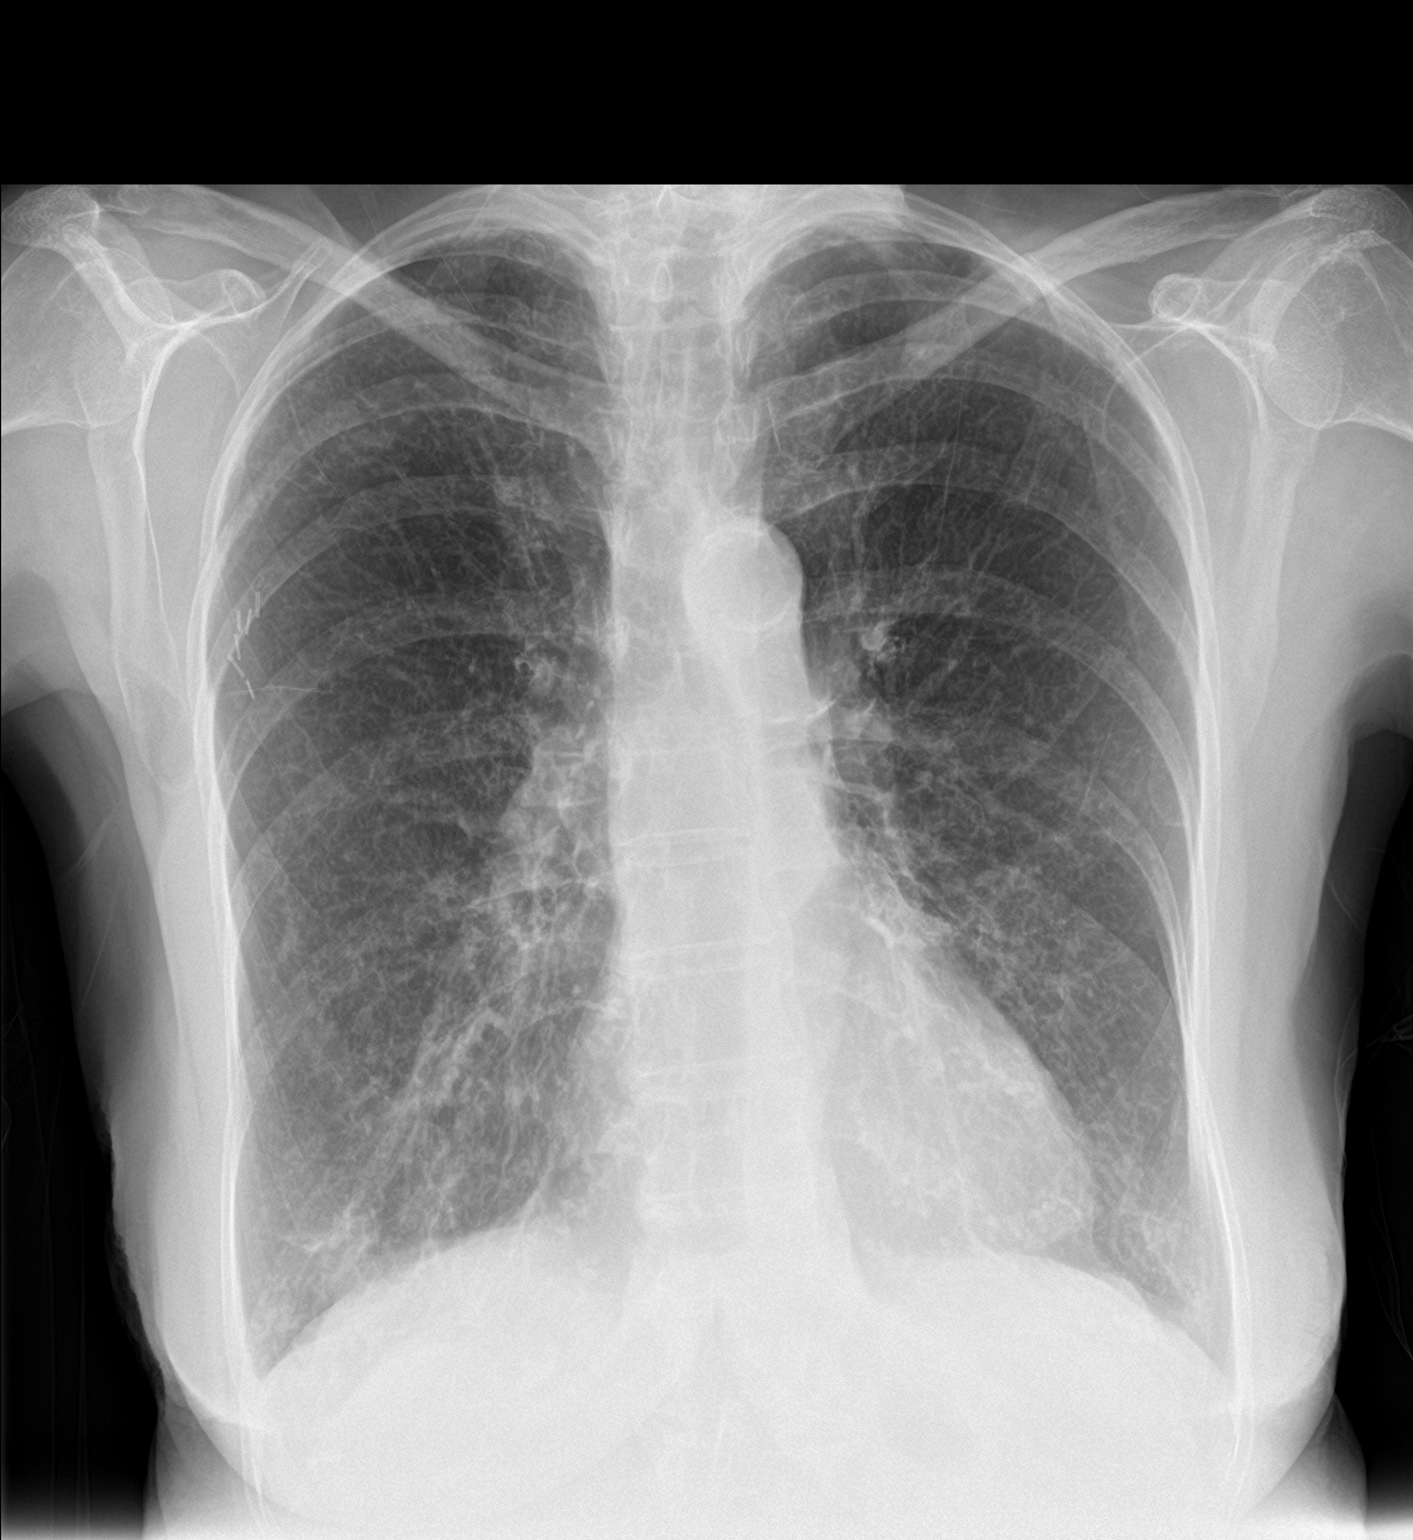

[chest lat]
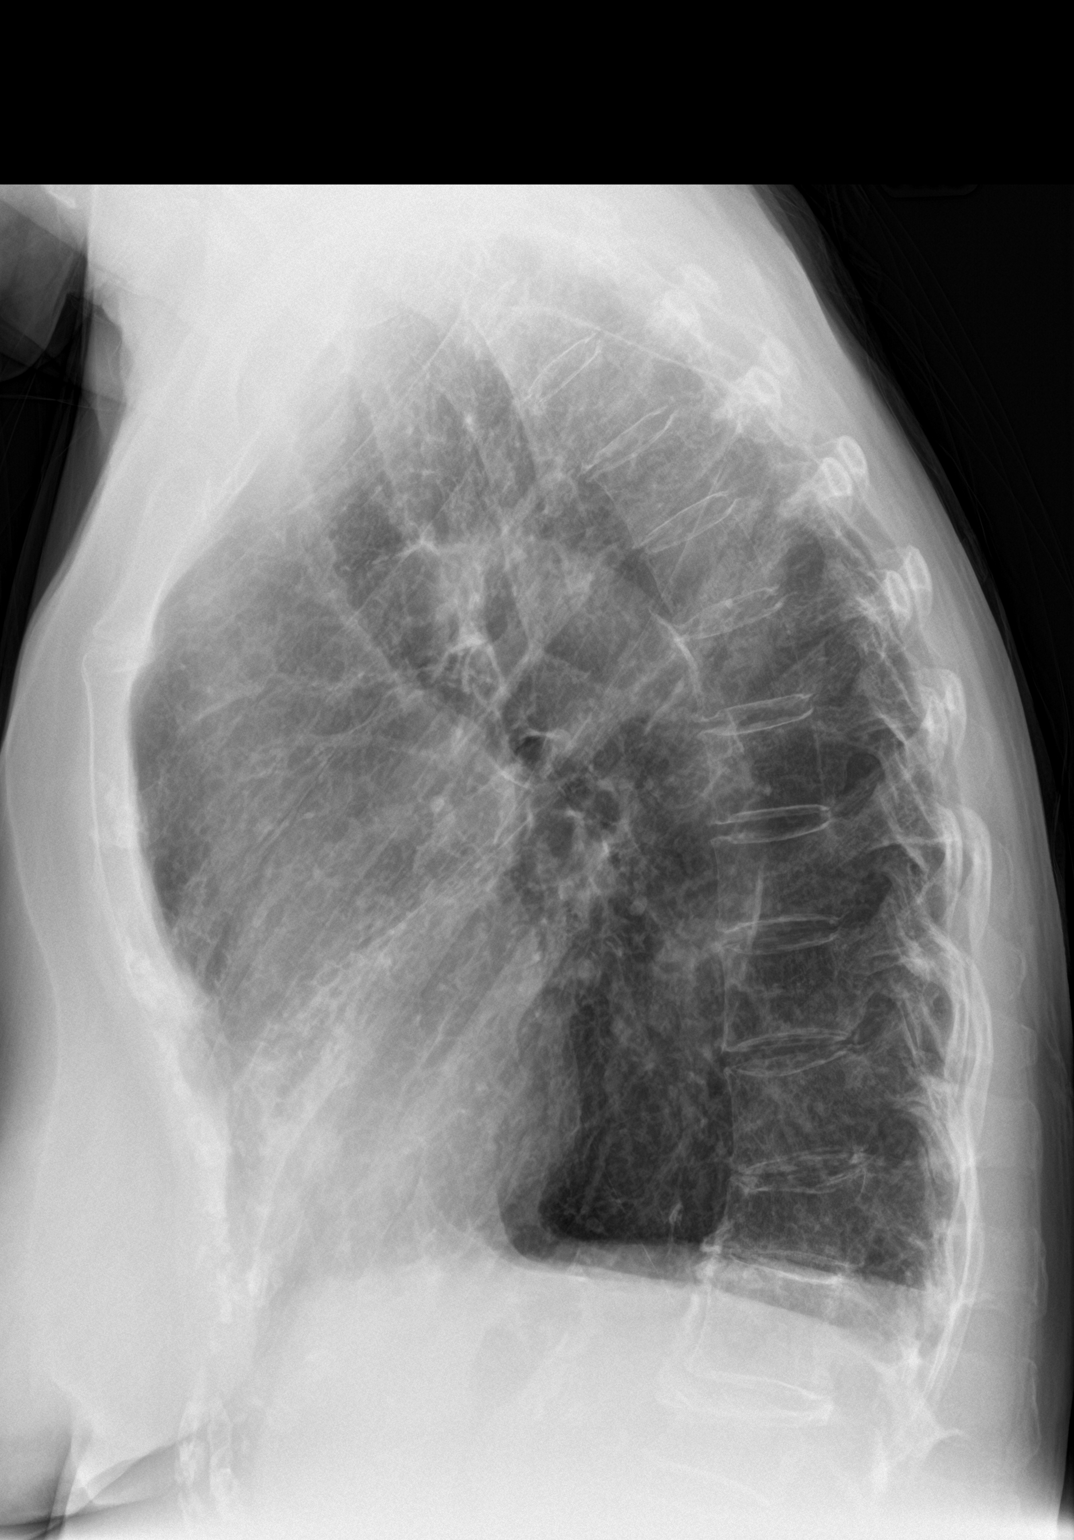

[2 of 2 positions shown; findings below may reference images not displayed]

FINDINGS: The lungs remain hyperinflated with hemidiaphragm flattening. The
interstitial markings are increased but stable. Coarse right middle
lobe lung markings remain conspicuous. The heart and pulmonary
vascularity are normal. The trachea is midline. There is
calcification in the wall of the aortic arch. There is no pleural
effusion. The bony thorax exhibits no acute abnormality. There are
partial compressions of lower thoracic and upper lumbar vertebral
bodies.
IMPRESSION: Chronic bronchitic-bronchiectatic changes. No pneumonia, CHF, nor
other acute cardiopulmonary abnormality.

Thoracic aortic atherosclerosis.

## 2020-04-01 DIAGNOSIS — R2243 Localized swelling, mass and lump, lower limb, bilateral: Secondary | ICD-10-CM | POA: Insufficient documentation

## 2020-05-16 ENCOUNTER — Telehealth: Payer: Self-pay | Admitting: Internal Medicine

## 2020-05-16 NOTE — Telephone Encounter (Signed)
Called pt's daughter Lacey Stanton to see if they had contacted [employer] about pt receiving a POC through the company as once the company had been contacted they should fax [place] a form to have filled out and signed.  Lacey Stanton said that they were told by [employer] that a form had been faxed to our office twice. Stated to Lacey Stanton that we would check on this and update them once we had one and she verbalized understanding.  Form has been located in [employer] file up front. Called Lacey Stanton to let her know that this had been located and stated to her once MW returned to [facility] office that we would get it taken care of for pt and she verbalized understanding.  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

## 2020-05-19 NOTE — Telephone Encounter (Signed)
Called and spoke with patient's daughter, Erskine Squibb, listed on DPR, advised that Dr. Sherene Sires will be in the office tomorrow and we will have him sign the form for the POC at that time.  She verbalized understanding.  Johny Drilling [facility] has the form.

## 2020-05-19 NOTE — Telephone Encounter (Signed)
I have this form Dr Sherene Sires is here Tuesday I will have him sign it

## 2020-05-21 NOTE — Telephone Encounter (Signed)
Lacey Stanton returning a phone call to check on the status of the poc order. Lacey Stanton can be reached at [phone].

## 2020-05-21 NOTE — Telephone Encounter (Signed)
The [employer] form has been signed and I just faxed it  Spoke with Erskine Squibb and notified that this was done  Nothing further needed

## 2020-05-26 ENCOUNTER — Other Ambulatory Visit: Payer: Self-pay

## 2020-05-26 ENCOUNTER — Ambulatory Visit (INDEPENDENT_AMBULATORY_CARE_PROVIDER_SITE_OTHER): Payer: Medicare Other | Admitting: Podiatry

## 2020-05-26 DIAGNOSIS — B351 Tinea unguium: Secondary | ICD-10-CM

## 2020-05-26 DIAGNOSIS — B353 Tinea pedis: Secondary | ICD-10-CM | POA: Diagnosis not present

## 2020-05-26 MED ORDER — KETOCONAZOLE 2 % EX CREA: TOPICAL_CREAM | CUTANEOUS | 0 refills | Status: DC

## 2020-05-26 NOTE — Progress Notes (Signed)
  Subjective:  Patient ID: BRICIA TAHER, female    DOB: December 17, 1926,  MRN: [id number]  Chief Complaint  Patient presents with  . Nail Problem    BL nails trimming -Rt hallux very thick and sore   87 y.o. female presents with the above complaint. History confirmed with patient.  Objective:  Physical Exam: warm, good capillary refill, nail exam onychomycosis of the toenails, no trophic changes or ulcerative lesions. DP pulses palpable, PT pulses palpable and protective sensation intact. Poor toe circulation with Raynaud's Venous insuff bilat with pitting edema, thin shiny skin Xerosis with scaling and pruritic plaque moccassin distribution, improved since prior. Foot drop right, right posterior ankle abrasion No images are attached to the encounter.  Assessment:   1. Tinea pedis of both feet   2. Onychomycosis      Plan:  Patient was evaluated and treated and all questions answered.  Tinea Pedis -Still present. Refill ketoconaozole cream. Discussed treatment strategies with patient.  Onychomycosis -Nails debrided. ABN on file from August 21   Return in about 3 months (around 08/26/2020) for Nail Fungus.

## 2020-06-03 ENCOUNTER — Telehealth: Payer: Self-pay | Admitting: Internal Medicine

## 2020-06-03 NOTE — Telephone Encounter (Signed)
Called and spoke to pt. Pt states she threw away her Pulmicort by accident and a new shipment is coming on 5/11 or 5/12. Pt states she is taking Industrial/product designer as needed. Advised pt that she should be fine to wait for the pulmicort to come in and to take her other medication as prescribed. Pt aware to call us if doesn't come by Friday. Pt verbalized understanding and denied any further questions or concerns at this time.

## 2020-06-10 ENCOUNTER — Other Ambulatory Visit: Payer: Self-pay | Admitting: Internal Medicine

## 2020-07-07 ENCOUNTER — Other Ambulatory Visit: Payer: Self-pay | Admitting: Podiatry

## 2020-07-08 NOTE — Telephone Encounter (Signed)
Please advise 

## 2020-08-04 ENCOUNTER — Telehealth: Payer: Self-pay | Admitting: Internal Medicine

## 2020-08-04 NOTE — Telephone Encounter (Signed)
Called and spoke with patient. She verbalized understanding. She stated that she was ok with waiting for a response from [facility] tomorrow. She has an appt tomorrow at 930am so if she does not answer, it is ok to leave a detailed message.   MW, can you please advise? Thanks.

## 2020-08-04 NOTE — Telephone Encounter (Signed)
Sorry to hear that she has not feeling any better.  If she is completed 2 courses of antibiotics will need office visit for further evaluation and possible chest x-ray  Will send to Dr. Sherene Sires  for [facility] and further recs   Please contact office for sooner follow up if symptoms do not improve or worsen or seek emergency care

## 2020-08-04 NOTE — Telephone Encounter (Signed)
Primary Pulmonologist: MW Last office visit and with whom:03/28/20 with MW What do we see them for (pulmonary problems): COPD Last OV assessment/plan: Onset of symptoms 1980s - CT 11/07/08: Bronchiectasis in the lingula  > rml plus small HH - Sinus CT neg 04/17/09  -PFT's 09/16/2010   FEV1  .89( 55%)  Ratio 49  No better with B2,  DLCO 62 > improved to 107 - PFTs  08/10/2012    FEV1  0.78 (50%) ratio 58,  No better p B2  DLCO 55 improved to 86% - Alpha one AT phenotype 07/07/10: MM CT chest>   03/16/13 bronchiectasis with nodules - Spirometry 02/10/2015  FEV1  0.54 (33%) ratio 53   - PFT's  07/28/2015  FEV1 0.82 (56 % ) ratio 57  p 3 % improvement from saba p laba/ics neb  prior to study with DLCO  48/53 % corrects to 83 % for alv volume   - 07/02/16 rec doxy prn flares - PFT's  07/12/2017  FEV1 0.65 (48 % ) ratio 59  p no % improvement from saba p nothing prior to study with DLCO  45 % corrects to 75 % for alv volume  - 07/12/17 Changed prn abx to cipro 750 bid x 10 days as doxy no longer effective - 03/03/2018  After extensive coaching inhaler device,  effectiveness =    75%  (short Ti) - 09/27/2018 changed prn abx to omnicef 300 mg bid x 7 days based on previous repsonse and sputum growing e coli 03/31/2018     - 01/28/19  Quant Gold TB neg   - 02/19/20 started daily pred with ceiling 20 mg and floor 5 mg daily plus reduced brovana to one half vial bid due to tremor from full strength > improved 03/28/2020    Adequate control on present rx, reviewed in detail with pt > no change in rx needed    Was appointment offered to patient (explain)?  I did attempt to get the patient scheduled for an acute visit but we currently do not have any openings.    Reason for call: Called and spoke with patient. She stated that she completed 2 rounds of Omnicef with in the past week due to the productive cough she has. It has not helped. She has been coughing up thick, dark yellow phlegm. She also has been wheezing more and more  SOB. She is still using 3L of O2. Denied any fevers, her temp was 96.72F about 2 hours ago (she also stated that the thermometer was not the best). She has had come chills. Still using Brovana and Pulmicort twice daily.   She has denied being around any one sick recently.   (examples of things to ask: : When did symptoms start? Fever? Cough? Productive? Color to sputum? More sputum than usual? Wheezing? Have you needed increased oxygen? Are you taking your respiratory medications? What over the counter measures have you tried?)  Allergies  Allergen Reactions   Pentoxifylline Other (See Comments) and Shortness Of Breath    Chest tightness and speech problem    Amoxicillin     REACTION: hives   Avelox [Moxifloxacin Hcl In Nacl] Itching   Iron     Per pt can only tolerate iron injection   Levaquin [Levofloxacin In D5w] Nausea Only   Other Other (See Comments)   Statins Other (See Comments)    Joint pain   Sulfa Antibiotics     Unknown reaction   Symbicort [Budesonide-Formoterol Fumarate]  Pt reports she cannot take this d/t having glaucoma.  Has tried this and it affected eyes (eye pain).    Immunization History  Administered Date(s) Administered   Influenza Split 11/07/2012, 11/08/2013, 10/01/2014   Influenza Whole 10/25/2009, 10/26/2011, 10/26/2015   Influenza, High Dose Seasonal PF 10/07/2014, 10/13/2015, 11/12/2016, 10/21/2017, 11/24/2018   Influenza-Unspecified 11/07/2012, 11/08/2013, 10/01/2014, 11/12/2019   Janssen (J&J) SARS-COV-2 Vaccination 04/04/2019   Pneumococcal Conjugate-13 08/07/2013, 10/08/2013   Pneumococcal Polysaccharide-23 04/07/2009   Tdap 07/08/2010   Zoster, Live 09/19/2007, 05/10/2011   I did attempt to see if I could get her scheduled for an appt but we do not have any openings. She is requesting recommendations.   TP, can you please advise? Thanks!

## 2020-08-05 MED ORDER — DOXYCYCLINE HYCLATE 100 MG PO TABS: 100.0000 mg | ORAL_TABLET | Freq: Two times a day (BID) | ORAL | 0 refills | Status: DC

## 2020-08-05 NOTE — Telephone Encounter (Signed)
Called and spoke with patient. She stated that she had decreased her prednisone down to 5mg  once daily. She is aware to increase to prednisone 20mg  until she feels better. She is also aware of the doxy.   RX has been sent to Drug.   Nothing further needed at time of call.

## 2020-08-05 NOTE — Telephone Encounter (Signed)
Make sure pred dose is 20 mg daily and ok to try doxy 100 mg bid x 10 days take with large glass of water just prior to eating  If getting worse on that will need to go to ER as she is allergic/intolerant to most abx

## 2020-08-15 ENCOUNTER — Other Ambulatory Visit: Payer: Self-pay | Admitting: Internal Medicine

## 2020-08-18 ENCOUNTER — Telehealth: Payer: Self-pay | Admitting: Internal Medicine

## 2020-08-18 NOTE — Telephone Encounter (Signed)
Pt stated that she received a message on Friday on her voicemail that she needs to make an appointment to be seen with Dr. Sherene Sires sooner than her annual appointment in Sept the pt stated that our office informed her pharmacy they would not fill an antibiotic for her unless she is seen sooner and she is wanting to confirm this information. I did not see a message made in regards to this.   Pharmacy; [facility] - [place], [place] - [address]   Pls regard; [phone]

## 2020-08-18 NOTE — Telephone Encounter (Signed)
Spoke with pt who states [facility] told her there was not a refill on her Doxycycline. Pt has completed 2 rounds of Omicef and 1 round of Doxycycline. Pt states cough has resolved and mucus is much lighter since completing last course of ABX. Pt does have OV scheduled in Sept in which she was told to keep. Pt was instructed that if her cough returned to call and get a sooner appointment. Pt stated understanding. Nothing further needed at this time.

## 2020-08-28 ENCOUNTER — Encounter: Payer: Self-pay | Admitting: Internal Medicine

## 2020-08-28 ENCOUNTER — Telehealth: Payer: Self-pay | Admitting: Internal Medicine

## 2020-08-28 ENCOUNTER — Ambulatory Visit (INDEPENDENT_AMBULATORY_CARE_PROVIDER_SITE_OTHER): Payer: Medicare Other | Admitting: Internal Medicine

## 2020-08-28 ENCOUNTER — Ambulatory Visit (INDEPENDENT_AMBULATORY_CARE_PROVIDER_SITE_OTHER): Payer: Medicare Other

## 2020-08-28 ENCOUNTER — Other Ambulatory Visit: Payer: Self-pay

## 2020-08-28 DIAGNOSIS — J479 Bronchiectasis, uncomplicated: Secondary | ICD-10-CM

## 2020-08-28 MED ORDER — DOXYCYCLINE HYCLATE 100 MG PO TABS: 100.0000 mg | ORAL_TABLET | Freq: Two times a day (BID) | ORAL | 11 refills | Status: DC

## 2020-08-28 NOTE — Telephone Encounter (Signed)
Please see 08/18/2020 phone note.  Patient completed course of doxy one week ago.  She reports of some improvement with abx but sx did not completely subside.  She is concerned that she still has an infection. C/o prod cough with yellow sputum, increased sob with exertion and at rest and chills x21mo. Denied fever, sweats or additional sx.   Acute visit scheduled for today at 4:00. Patient aware to bring all meds including OTC. Okay per MW via epic secure chat.   Nothing further needed.

## 2020-08-28 NOTE — Patient Instructions (Addendum)
For flares > prednisone 20 mg daily until better then 10 mg daily x 5 days, then 5 mg daily   For nasty mucus >  doxy 100 mg twice daily  x 10 days  take with glass of water before eat   For cough / congestion > mucinex or mucinex dm 1200 mg every 12 hours as needed and use the flutter valve as much as possible  Please remember to go to the  x-ray department  for your tests - we will call you with the results when they are available     Keep your prior appt - call sooner if needed

## 2020-08-28 NOTE — Progress Notes (Signed)
Subjective:   Patient ID: Lacey Stanton, female    DOB: 01/20/1927   MRN: [id number]   Brief patient profile:  31  yowf never smoker/MM with obstructive  bronchiectasis with resp symptoms  of cough/sob dating back to  the 1980's   History of Present Illness  06/29/2012 f/u ov/Sayla Golonka re bronchiectasis on 02 just at hs and bud/brovana bid maint rx Chief Complaint  Patient presents with   Follow-up    Pt c/o increased SOB for the past month. She feels that nebs are not lasting long enough any longer.   at baseline maintaining brovana budesonide and rarely needing saba hfa Then one month pta fever no cough but sob and increased proaire and used 02  > ov with Dr Martha Clan rx Avelox and fever resolved but not breathing to her satisfaction and using proaire 2 x daily but no neb saba. Doe x > slow adls rec increase zantac (ranitidine) to 150 mg after bfast and after supper  GERD  diet Continue to use the nebulizer brovana and budesonide and then 12 hours later. Prednisone 10 mg take  4 each am x 2 days,   2 each am x 2 days,  1 each am x2days and stop  Work on inhaler technique    Only use your albuterol (proaire) as rescue    03/03/2018  f/u ov/Kevina Piloto re: obstructive bronchiectasis  Chief Complaint  Patient presents with   Follow-up    Pepcid not working, still having colored mucus, oxygen levels dropping even at rest, on oxygen at night, SOB  Dyspnea: 2 wheeled walker / mailbox 50 ft flat is her limit = MMRC3 = can't walk 100 yards even at a slow pace at a flat grade s stopping due to sob   Cough: about the same/ cipro really didn't help but can't tol avelox and pcn allergic per records/ not using flutter or mucinex as rec  Sleeping: bed flat/ 3 pillows SABA use: 3-4 x per week 02: 2lpm hs rec  Plan A = Automatic =  Brovana / Budesonide 1st thing in am and 12 hours later Plan B = Backup for breathing/ congestion Only use your albuterol as a rescue medication Work on inhaler technique:    Plan C = Crisis - only use your albuterol nebulizer if you first try Plan B  For cough / congestion > mucinex dm up to 1200 mg every 12 hours and use the flutter valve as much as possible  Prednisone 10 mg take  4 each am x 2 days,   2 each am x 2 days,  1 each am x 2 days and stop  Zpak   Sputum culture 03/31/18  E coli > rx omnicef x 7 days > much better     08/09/2018  f/u ov/Olanda Downie re: bronchiectasis on brovana/bud bid  Confused with details of care, did not bring new med calendar but says "best she's been in a while"  Chief Complaint  Patient presents with   Other    Bronchiectasis - patient has been doing well since last visit.Medications have been working.  Dyspnea:  Walk in hallways at home and mailbox with a cane or walker  Cough: some in am dark but white  Sleeping:  1- 2 pillows but bed is flat  SABA use: not needing  02: 2lpm hs and famotidine but still with breakthru sense of hb/ "Chest congestion " at hs rec Pepcid hs    01/28/2020 acute ov/Nataliyah Packham re: obstructive bronchiectasis  -  sob worse x 4 weeks p one episode N and V but no additional episodes  Chief Complaint  Patient presents with   Follow-up    Obstructive bronchiectasis  Dyspnea:  Walking to living room daily o/w very little activity  Cough: can't tolerate mucinex 600 ok on mucinex junior/ minimal am yellow production/ using flutter valve/ has not used omnicef since onset of worse sob   Sleeping:  30-45 degrees x months SABA use: avg use once a day  02: 2lpm 24/7 does not titrate  rec Make sure you check your oxygen saturation  at your highest level of activity  to be sure it stays over 90% and adjust  02 flow upward to maintain this level if needed but remember to turn it back to previous settings when you stop (to conserve your supply).  Prednisone 10 mg take  4 each am x 2 days,   2 each am x 2 days,  1 each am x 2 days and stop  Zpak x 2 (on the 6th day just take one so 11 days of therapy Decrease Budesonide  to once daily - no change on the brovana every 12 hours     .    02/19/20 Televisit rec Prednisone 10 mg take 2 daily until better, then 1 daily x 1 week then one-half daily  Keep the candy handy / ice/ sips    03/28/2020  f/u ov/Jen Eppinger re: obst bronchiectasis/ shaking better on brovana half vial bid and much better on pred maint = 5mg  daily  Chief Complaint  Patient presents with   Follow-up    Good and bad days  Dyspnea:  Walking thru house now / to mb which is better than prior around 75 ft flat Cough: less congested  - last omnicef was 3-4 weeks prior to OV  / mucus is clear  Sleeping: bed blocks/pillows = 30 degrees SABA use: brovana half strength and does not need hfa  02: 2lpm hs and prn  Covid status:   J&J x one remotely  Rec No change rx   08/28/2020 acute  ov/Mareena Cavan re: flare onset early July 2022  self rx omnicef x 2 no change then rx doxy x 10 days finished on 08/15/20  and prednisone 20 mg max from a baseline of 5 mg , presently on 5mg   Chief Complaint  Patient presents with   Acute Visit    Infection in lungs Omnicef didn't clear infection up. Times 1 month. Coughing and sob.   Dyspnea:  walks on 2 wheeled walker room to room at home and walks to neighbors x 50 ft flat  Cough: since late June yellow mucus better now  Sleeping: 30 degrees bed blocks  SABA use: none  02: 2-3lpm  Covid status:   j/j only    No obvious day to day or daytime variability or assoc  mucus plugs or hemoptysis or cp or chest tightness, subjective wheeze or overt sinus or hb symptoms.   Sleeping as above  without nocturnal  or early am exacerbation  of respiratory  c/o's or need for noct saba. Also denies any obvious fluctuation of symptoms with weather or environmental changes or other aggravating or alleviating factors except as outlined above   No unusual exposure hx or h/o childhood pna/ asthma or knowledge of premature birth.  Current Allergies, Complete Past Medical History, Past  Surgical History, Family History, and Social History were reviewed in record.  ROS  The following  are not active complaints unless bolded Hoarseness, sore throat, dysphagia, dental problems, itching, sneezing,  nasal congestion or discharge of excess mucus or purulent secretions, ear ache,   fever, chills, sweats, unintended wt loss or wt gain, classically pleuritic or exertional cp,  orthopnea pnd or arm/hand swelling  or leg swelling, presyncope, palpitations, abdominal pain, anorexia, nausea, vomiting, diarrhea  or change in bowel habits or change in bladder habits, change in stools or change in urine, dysuria, hematuria,  rash, arthralgias, visual complaints, headache, numbness, weakness or ataxia or problems with walking or coordination,  change in mood or  memory.        Current Meds  Medication Sig   BIOTIN PO Take 1,000 mg by mouth daily.   BROVANA 15 MCG/2ML NEBU USE 1 VIAL  IN  NEBULIZER TWICE  DAILY - Morning and Evening   budesonide (PULMICORT) 0.5 MG/2ML nebulizer solution One each am   cefdinir (OMNICEF) 300 MG capsule Take 1 capsule (300 mg total) by mouth 2 (two) times daily.   Cholecalciferol (VITAMIN D-3) 5000 units TABS Take 1 tablet by mouth daily.   famotidine (PEPCID) 20 MG tablet TAKE ONE TABLET BY MOUTH EVERY DAY AT BEDTIME   ketoconazole (NIZORAL) 2 % cream Apply 1 fingertip amount to each foot daily.   levothyroxine (SYNTHROID, LEVOTHROID) 50 MCG tablet Take by mouth daily.   Melatonin 10 MG TABS Take 1 tablet by mouth at bedtime.   OXYGEN 2 lpm with sleep and as needed during the day   pantoprazole (PROTONIX) 40 MG tablet Take 1 tablet (40 mg total) by mouth daily. Take 30-60 MINUTES before first meal of the day   polyethylene glycol (MIRALAX / GLYCOLAX) 17 g packet Take 17 g by mouth daily.   predniSONE (DELTASONE) 10 MG tablet Take 2 daily until better, then 1 daily for 1 week, then 1/2 tab daily and stay   Prenatal Vit-Fe  Sulfate-FA (PRENATAL MULTIVIT-IRON PO) Take 1 tablet by mouth daily.   PROAIR HFA 108 (90 Base) MCG/ACT inhaler Inhale 2 puffs into the lungs every 4 (four) hours as needed for Wheezing.   Probiotic CAPS Take 1 capsule by mouth daily.   Respiratory Therapy Supplies (FLUTTER) DEVI Use as directed   verapamil (CALAN-SR) 120 MG CR tablet Take 120 mg by mouth at bedtime.                      Past Medical History:  Bronchiectasis  - CT 11/07/08: Bronchiectasis in the lingular > RML plus small HH  - Sinus CT neg 04/17/09  - HFA 25 > 75% effective April 15, 2009  -PFT's 09/16/2010   FEV1  .89( 55%)  Ratio 49  No better with B2,  DLCO 62 > improved to 107 Pneumonia > cleared radiographically 07/01/2010     - Pneumovax March 2011  Allergic Rhinitis  Pulmonary hemorrhage 1979   Osteoarthritis      - s/p  L TKR 02/2000 >>  Difficult recovery Hypothyroidism  GERD diet       Objective:   Physical Exam   08/28/2020      101  03/28/2020      98   01/28/2020     97  09/27/2019    106   08/09/2019   106   03/03/2018    115  08/09/2018    115  12/05/2017 113  Vital signs reviewed  08/28/2020  - Note at rest 02 sats  94% on 3lpm  General appearance:    chronically ill elderly wf nad / uses 2 wheeled walker      HEENT : pt wearing mask not removed for exam due to covid - 19 concerns.   NECK :  without JVD/Nodes/TM/ nl carotid upstrokes bilaterally   LUNGS: no acc muscle use,  Min barrel  contour chest wall with bilateral insp/exp rhonchi and crackles R > L base and  without cough on insp or exp maneuvers and min  Hyperresonant  to  percussion bilaterally     CV:  RRR  no s3 or murmur or increase in P2, and no edema   ABD:  soft and nontender with pos end  insp Hoover's  in the supine position. No bruits or organomegaly appreciated, bowel sounds nl  MS:   Nl gait/  ext warm without deformities, calf tenderness, cyanosis or clubbing No obvious joint restrictions   SKIN: warm and dry  without lesions    NEURO:  alert, approp, nl sensorium with  no motor or cerebellar deficits apparent.        CXR PA and Lateral:   08/28/2020 :    I personally reviewed images/ radiology impression as follows:    Somewhat improved chronic marking R lower lung zone probably mostly in RLL   Assessment & Plan:

## 2020-08-30 ENCOUNTER — Encounter: Payer: Self-pay | Admitting: Internal Medicine

## 2020-08-30 NOTE — Assessment & Plan Note (Signed)
Onset of symptoms 1980s - CT 11/07/08: Bronchiectasis in the lingula  > rml plus small HH  - Sinus CT neg 04/17/09   -PFT's 09/16/2010   FEV1  .89( 55%)  Ratio 49  No better with B2,  DLCO 62 > improved to 107 - PFTs  08/10/2012    FEV1  0.78 (50%) ratio 58,  No better p B2  DLCO 55 improved to 86% - Alpha one AT phenotype 07/07/10: MM CT chest>   03/16/13 bronchiectasis with nodules - Spirometry 02/10/2015  FEV1  0.54 (33%) ratio 53   - PFT's  07/28/2015  FEV1 0.82 (56 % ) ratio 57  p 3 % improvement from saba p laba/ics neb  prior to study with DLCO  48/53 % corrects to 83 % for alv volume   - 07/02/16 rec doxy prn flares  - PFT's  07/12/2017  FEV1 0.65 (48 % ) ratio 59  p no % improvement from saba p nothing prior to study with DLCO  45 % corrects to 75 % for alv volume   - 07/12/17 Changed prn abx to cipro 750 bid x 10 days as doxy no longer effective  - 03/03/2018  After extensive coaching inhaler device,  effectiveness =    75%  (short Ti) - 09/27/2018 changed prn abx to omnicef 300 mg bid x 7 days based on previous repsonse and sputum growing e coli 03/31/2018     - 01/28/19  Quant Gold TB neg - 02/19/20 started daily pred with ceiling 20 mg and floor 5 mg daily plus reduced brovana to one half vial bid due to tremor from full strength - 08/28/2020 changed to doxy x 10 d prn flare   Relatively well compensated but severe bronchiectasis, reviewed how to use prn doxy/ mcinex dm and flutter valve.  F/u as planned in sept 2022 then q 6 m         Each maintenance medication was reviewed in detail including emphasizing most importantly the difference between maintenance and prns and under what circumstances the prns are to be triggered using an action plan format where appropriate.  Total time for H and P, chart review, counseling, reviewing flutter  device(s) and generating customized AVS unique to this office visit / same day charting = 25 min

## 2020-09-01 ENCOUNTER — Other Ambulatory Visit: Payer: Self-pay

## 2020-09-01 ENCOUNTER — Ambulatory Visit (INDEPENDENT_AMBULATORY_CARE_PROVIDER_SITE_OTHER): Payer: Medicare Other | Admitting: Podiatry

## 2020-09-01 DIAGNOSIS — B353 Tinea pedis: Secondary | ICD-10-CM

## 2020-09-01 DIAGNOSIS — B351 Tinea unguium: Secondary | ICD-10-CM

## 2020-09-01 NOTE — Progress Notes (Signed)
  Subjective:  Patient ID: Lacey Stanton, female    DOB: Jun 05, 1926,  MRN: [id number]  Chief Complaint  Patient presents with   Nail Problem    F/U nail fungus -pt compliance with fluconazole cream    87 y.o. female presents with the above complaint. History confirmed with patient.  Objective:  Physical Exam: warm, good capillary refill, nail exam onychomycosis of the toenails, no trophic changes or ulcerative lesions. DP pulses palpable, PT pulses palpable and protective sensation intact. Poor toe circulation with Raynaud's Venous insuff bilat with pitting edema, thin shiny skin Xerosis with scaling and pruritic plaque moccassin distribution, improved since prior. Foot drop right, right posterior ankle abrasion No images are attached to the encounter.  Assessment:   1. Onychomycosis   2. Tinea pedis of both feet    Plan:  Patient was evaluated and treated and all questions answered.  Tinea Pedis -Continue use of ketoconazole to entirety of plantar foot, not fully resolved but is improving. F/u should it not resolve.   No follow-ups on file.

## 2020-09-05 ENCOUNTER — Other Ambulatory Visit: Payer: Self-pay | Admitting: Internal Medicine

## 2020-09-05 DIAGNOSIS — R0600 Dyspnea, unspecified: Secondary | ICD-10-CM

## 2020-09-05 DIAGNOSIS — R0609 Other forms of dyspnea: Secondary | ICD-10-CM

## 2020-09-26 ENCOUNTER — Ambulatory Visit: Payer: Medicare Other | Admitting: Internal Medicine

## 2020-10-01 ENCOUNTER — Ambulatory Visit: Payer: Medicare Other | Admitting: Internal Medicine

## 2020-10-28 ENCOUNTER — Ambulatory Visit: Payer: Medicare Other | Admitting: Internal Medicine

## 2020-10-31 ENCOUNTER — Ambulatory Visit (INDEPENDENT_AMBULATORY_CARE_PROVIDER_SITE_OTHER): Payer: Medicare Other | Admitting: Internal Medicine

## 2020-10-31 ENCOUNTER — Ambulatory Visit (INDEPENDENT_AMBULATORY_CARE_PROVIDER_SITE_OTHER): Payer: Medicare Other

## 2020-10-31 ENCOUNTER — Encounter: Payer: Self-pay | Admitting: Internal Medicine

## 2020-10-31 ENCOUNTER — Other Ambulatory Visit: Payer: Self-pay

## 2020-10-31 DIAGNOSIS — J479 Bronchiectasis, uncomplicated: Secondary | ICD-10-CM

## 2020-10-31 DIAGNOSIS — J9611 Chronic respiratory failure with hypoxia: Secondary | ICD-10-CM | POA: Diagnosis not present

## 2020-10-31 NOTE — Progress Notes (Signed)
Subjective:   Patient ID: Lacey Stanton, female    DOB: 1926/09/13   MRN: [id number]   Brief patient profile:  26  yowf never smoker/MM with obstructive  bronchiectasis with resp symptoms  of cough/sob dating back to  the 1980's   History of Present Illness  06/29/2012 f/u ov/Lacey Stanton re bronchiectasis on 02 just at hs and bud/brovana bid maint rx Chief Complaint  Patient presents with   Follow-up    Pt c/o increased SOB for the past month. She feels that nebs are not lasting long enough any longer.   at baseline maintaining brovana budesonide and rarely needing saba hfa Then one month pta fever no cough but sob and increased proaire and used 02  > ov with Dr Lacey Stanton rx Avelox and fever resolved but not breathing to her satisfaction and using proaire 2 x daily but no neb saba. Doe x > slow adls rec increase zantac (ranitidine) to 150 mg after bfast and after supper  GERD  diet Continue to use the nebulizer brovana and budesonide and then 12 hours later. Prednisone 10 mg take  4 each am x 2 days,   2 each am x 2 days,  1 each am x2days and stop  Work on inhaler technique    Only use your albuterol (proaire) as rescue    03/03/2018  f/u ov/Lacey Stanton re: obstructive bronchiectasis  Chief Complaint  Patient presents with   Follow-up    Pepcid not working, still having colored mucus, oxygen levels dropping even at rest, on oxygen at night, SOB  Dyspnea: 2 wheeled walker / mailbox 50 ft flat is her limit = MMRC3 = can't walk 100 yards even at a slow pace at a flat grade s stopping due to sob   Cough: about the same/ cipro really didn't help but can't tol avelox and pcn allergic per records/ not using flutter or mucinex as rec  Sleeping: bed flat/ 3 pillows SABA use: 3-4 x per week 02: 2lpm hs rec  Plan A = Automatic =  Brovana / Budesonide 1st thing in am and 12 hours later Plan B = Backup for breathing/ congestion Only use your albuterol as a rescue medication Work on inhaler technique:    Plan C = Crisis - only use your albuterol nebulizer if you first try Plan B  For cough / congestion > mucinex dm up to 1200 mg every 12 hours and use the flutter valve as much as possible  Prednisone 10 mg take  4 each am x 2 days,   2 each am x 2 days,  1 each am x 2 days and stop  Zpak   Sputum culture 03/31/18  E coli > rx omnicef x 7 days > much better    01/28/2020 acute ov/Lacey Stanton re: obstructive bronchiectasis  - sob worse x 4 weeks p one episode N and V but no additional episodes  Chief Complaint  Patient presents with   Follow-up    Obstructive bronchiectasis  Dyspnea:  Walking to living room daily o/w very little activity  Cough: can't tolerate mucinex 600 ok on mucinex junior/ minimal am yellow production/ using flutter valve/ has not used omnicef since onset of worse sob   Sleeping:  30-45 degrees x months SABA use: avg use once a day  02: 2lpm 24/7 does not titrate  rec Make sure you check your oxygen saturation  at your highest level of activity  to be sure it stays over 90% and adjust  02 flow upward to maintain this level if needed but remember to turn it back to previous settings when you stop (to conserve your supply).  Prednisone 10 mg take  4 each am x 2 days,   2 each am x 2 days,  1 each am x 2 days and stop  Zpak x 2 (on the 6th day just take one so 11 days of therapy Decrease Budesonide to once daily - no change on the brovana every 12 hours      08/28/2020 acute  ov/Lacey Stanton re: flare onset early July 2022  self rx omnicef x 2 no change then rx doxy x 10 days finished on 08/15/20  and prednisone 20 mg max from a baseline of 5 mg , presently on 5mg   Chief Complaint  Patient presents with   Acute Visit    Infection in lungs Omnicef didn't clear infection up. Times 1 month. Coughing and sob.   Dyspnea:  walks on 2 wheeled walker room to room at home and walks to neighbors x 50 ft flat  Cough: since late June yellow mucus better now  Sleeping: 30 degrees bed blocks  SABA  use: none  02: 2-3lpm  Covid status:   j/j only  Rec For flares > prednisone 20 mg daily until better then 10 mg daily x 5 days, then 5 mg daily  For nasty mucus >  doxy 100 mg twice daily  x 10 days  take with glass of water before eat For cough / congestion > mucinex or mucinex dm 1200 mg every 12 hours as needed and use the flutter valve as much as possible    10/31/2020  f/u ov/Lacey Stanton re: bronchiectasis maint on brovana/bud each am and just brovana in pm  Chief Complaint  Patient presents with   Follow-up    Patient has had a lot of throat clearing and congestion  Dyspnea:  2 wheeled walker / prednisone 10mg  floor  Cough: very little comes up > doxy usually works when turns purulent  Sleeping: 30 degrees with bed blocks SABA use: 3 x per month 02: 3lpm hs,   misunderstood instructions/ using p ex  Covid status:   JJ  only    No obvious day to day or daytime variability or assoc excess/ purulent sputum or mucus plugs or hemoptysis or cp or chest tightness, subjective wheeze or overt sinus or hb symptoms.   Sleeping as above  without nocturnal  or early am exacerbation  of respiratory  c/o's or need for noct saba. Also denies any obvious fluctuation of symptoms with weather or environmental changes or other aggravating or alleviating factors except as outlined above   No unusual exposure hx or h/o childhood pna/ asthma or knowledge of premature birth.  Current Allergies, Complete Past Medical History, Past Surgical History, Family History, and Social History were reviewed in 12/31/2020 record.  ROS  The following are not active complaints unless bolded Hoarseness, sore throat, dysphagia, dental problems, itching, sneezing,  nasal congestion or discharge of excess mucus or purulent secretions, ear ache,   fever, chills, sweats, unintended wt loss or wt gain, classically pleuritic or exertional cp,  orthopnea pnd or arm/hand swelling  or leg swelling, presyncope,  palpitations, abdominal pain, anorexia, nausea, vomiting, diarrhea  or change in bowel habits or change in bladder habits, change in stools or change in urine, dysuria, hematuria,  rash, arthralgias, visual complaints, headache, numbness, weakness or ataxia or problems with walking= uses 2 wheeled  walker  or coordination,  change in mood or  memory.        Current Meds  Medication Sig   arformoterol (BROVANA) 15 MCG/2ML NEBU USE 1 VIAL  IN  NEBULIZER TWICE  DAILY - Morning and Evening   BIOTIN PO Take 1,000 mg by mouth daily.   budesonide (PULMICORT) 0.5 MG/2ML nebulizer solution USE 1 VIAL  IN  NEBULIZER TWICE  DAILY - Rinse mouth after each treatment   Cholecalciferol (VITAMIN D-3) 5000 units TABS Take 1 tablet by mouth daily.   doxycycline (VIBRA-TABS) 100 MG tablet Take 1 tablet (100 mg total) by mouth 2 (two) times daily.   famotidine (PEPCID) 20 MG tablet TAKE ONE TABLET BY MOUTH EVERY DAY AT BEDTIME   ketoconazole (NIZORAL) 2 % cream Apply 1 fingertip amount to each foot daily.   levothyroxine (SYNTHROID, LEVOTHROID) 50 MCG tablet Take by mouth daily.   Melatonin 10 MG TABS Take 1 tablet by mouth at bedtime.   OXYGEN 2 lpm with sleep and as needed during the day   pantoprazole (PROTONIX) 40 MG tablet Take 1 tablet (40 mg total) by mouth daily. Take 30-60 MINUTES before first meal of the day   polyethylene glycol (MIRALAX / GLYCOLAX) 17 g packet Take 17 g by mouth daily.   predniSONE (DELTASONE) 10 MG tablet Take 2 daily until better, then 1 daily for 1 week, then 1/2 tab daily and stay   Prenatal Vit-Fe Sulfate-FA (PRENATAL MULTIVIT-IRON PO) Take 1 tablet by mouth daily.   PROAIR HFA 108 (90 Base) MCG/ACT inhaler Inhale 2 puffs into the lungs every 4 (four) hours as needed for Wheezing.   Probiotic CAPS Take 1 capsule by mouth daily.   Respiratory Therapy Supplies (FLUTTER) DEVI Use as directed   verapamil (CALAN-SR) 120 MG CR tablet Take 120 mg by mouth at bedtime.             Past Medical History:  Bronchiectasis  - CT 11/07/08: Bronchiectasis in the lingular > RML plus small HH  - Sinus CT neg 04/17/09  - HFA 25 > 75% effective April 15, 2009  -PFT's 09/16/2010   FEV1  .89( 55%)  Ratio 49  No better with B2,  DLCO 62 > improved to 107 Pneumonia > cleared radiographically 07/01/2010     - Pneumovax March 2011  Allergic Rhinitis  Pulmonary hemorrhage 1979   Osteoarthritis      - s/p  L TKR 02/2000 >>  Difficult recovery Hypothyroidism  GERD diet     Objective:   Physical Exam  10/31/2020     101   08/28/2020      101  03/28/2020      98   01/28/2020     97  09/27/2019    106   08/09/2019   106   03/03/2018    115  08/09/2018    115  12/05/2017 113  Vital signs reviewed  10/31/2020  - Note at rest 02 sats  93% on RA   General appearance:    amb  hoarse wf with 2 wheeled walker    HEENT : pt wearing mask not removed for exam due to covid - 19 concerns.   NECK :  without JVD/Nodes/TM/ nl carotid upstrokes bilaterally   LUNGS: no acc muscle use,  Min barrel  contour chest wall with bilateral  insp pops/squeaks  R> L and  without cough on insp or exp maneuvers and min  Hyperresonant  to  percussion bilaterally  CV:  RRR  no s3 or murmur or increase in P2, and no edema   ABD:  soft and nontender with pos end  insp Hoover's  in the supine position. No bruits or organomegaly appreciated, bowel sounds nl  MS:   Nl gait/  ext warm without deformities, calf tenderness, cyanosis or clubbing No obvious joint restrictions   SKIN: warm and dry without lesions    NEURO:  alert, approp, nl sensorium with  no motor or cerebellar deficits apparent.       CXR PA and Lateral:   10/31/2020 :    I personally reviewed images  / impression as follows:    Increased markings R base no acute changes        Assessment & Plan:

## 2020-10-31 NOTE — Patient Instructions (Addendum)
Make sure you check your oxygen saturation  at your highest level of activity  to be sure it stays over 90% and adjust  02 flow upward to maintain this level if needed but remember to turn it back to previous settings when you stop (to conserve your supply).    Please remember to go to the  x-ray department  for your tests - we will call you with the results when they are available     Please schedule a follow up visit in 6 months but call sooner if needed

## 2020-11-01 ENCOUNTER — Encounter: Payer: Self-pay | Admitting: Internal Medicine

## 2020-11-01 NOTE — Assessment & Plan Note (Signed)
Onset of symptoms 1980s - CT 11/07/08: Bronchiectasis in the lingula  > rml plus small HH  - Sinus CT neg 04/17/09   -PFT's 09/16/2010   FEV1  .89( 55%)  Ratio 49  No better with B2,  DLCO 62 > improved to 107 - PFTs  08/10/2012    FEV1  0.78 (50%) ratio 58,  No better p B2  DLCO 55 improved to 86% - Alpha one AT phenotype 07/07/10: MM CT chest>   03/16/13 bronchiectasis with nodules - Spirometry 02/10/2015  FEV1  0.54 (33%) ratio 53   - PFT's  07/28/2015  FEV1 0.82 (56 % ) ratio 57  p 3 % improvement from saba p laba/ics neb  prior to study with DLCO  48/53 % corrects to 83 % for alv volume   - 07/02/16 rec doxy prn flares  - PFT's  07/12/2017  FEV1 0.65 (48 % ) ratio 59  p no % improvement from saba p nothing prior to study with DLCO  45 % corrects to 75 % for alv volume   - 07/12/17 Changed prn abx to cipro 750 bid x 10 days as doxy no longer effective  - 03/03/2018  After extensive coaching inhaler device,  effectiveness =    75%  (short Ti) - 09/27/2018 changed prn abx to omnicef 300 mg bid x 7 days based on previous repsonse and sputum growing e coli 03/31/2018     - 01/28/19  Quant Gold TB neg - 02/19/20 started daily pred with ceiling 20 mg and floor 5 mg daily plus reduced brovana to one half vial bid due to tremor from full strength - 08/28/2020 changed to doxy x 10 d prn flare   She's about the same clinically with R Base changes on cxr the same and no overall progression on present rx including prn doxy and pred ceiling of 20 mg and floor of 5 mg daily

## 2020-11-01 NOTE — Assessment & Plan Note (Signed)
03/28/13 Qualification  Patient Saturations on Room Air at Rest = 93% Patient Saturations on [employer] while Ambulating = 88% Patient Saturations on 2 Liters of oxygen while Ambulating = 97% - 10/22/2014  Walked RA x 2 laps @ 185 ft each stopped due sob at slow pace but sats still 90%   - 07/28/2015  Walked RA x 3 laps @ 185 ft each stopped due to  End of study, nl pace, no sob or desat   - 12/16 21 Patient Saturations on Room Air at Rest = 98% Patient Saturations on Room Air while Ambulating = 87% Patient Saturations on 2 pulse dose Liters of oxygen while Ambulating = 94%  Again advised Make sure you check your oxygen saturation  at your highest level of activity  to be sure it stays over 90% and adjust  02 flow upward to maintain this level if needed but remember to turn it back to previous settings when you stop (to conserve your supply).          Each maintenance medication was reviewed in detail including emphasizing most importantly the difference between maintenance and prns and under what circumstances the prns are to be triggered using an action plan format where appropriate.  Total time for H and P, chart review, counseling, reviewing neb/02 device(s) and generating customized AVS unique to this office visit / same day charting = 

## 2020-12-04 ENCOUNTER — Encounter: Payer: Self-pay | Admitting: Internal Medicine

## 2020-12-04 ENCOUNTER — Other Ambulatory Visit: Payer: Self-pay

## 2020-12-04 ENCOUNTER — Ambulatory Visit (INDEPENDENT_AMBULATORY_CARE_PROVIDER_SITE_OTHER): Payer: Medicare Other | Admitting: Internal Medicine

## 2020-12-04 ENCOUNTER — Telehealth: Payer: Self-pay | Admitting: Internal Medicine

## 2020-12-04 ENCOUNTER — Ambulatory Visit (INDEPENDENT_AMBULATORY_CARE_PROVIDER_SITE_OTHER): Payer: Medicare Other

## 2020-12-04 VITALS — BP 124/68 | HR 91 | Temp 98.2°F | Ht 60.0 in | Wt 100.4 lb

## 2020-12-04 DIAGNOSIS — J479 Bronchiectasis, uncomplicated: Secondary | ICD-10-CM | POA: Diagnosis not present

## 2020-12-04 DIAGNOSIS — J9611 Chronic respiratory failure with hypoxia: Secondary | ICD-10-CM | POA: Diagnosis not present

## 2020-12-04 MED ORDER — ALBUTEROL SULFATE (2.5 MG/3ML) 0.083% IN NEBU: 2.5000 mg | INHALATION_SOLUTION | RESPIRATORY_TRACT | 12 refills | Status: DC | PRN

## 2020-12-04 NOTE — Patient Instructions (Addendum)
Ok to try prednisone 10 mg one every other day as your new floor - taper slowly = every few days    Plan A = Automatic = Always=    Brovana (aformoterol) twice daily with budesonide 0.5 mg twice daily   Plan B = Backup (to supplement plan A, not to replace it) Only use your albuterol inhaler as a rescue medication to be used if you can't catch your breath by resting or doing a relaxed purse lip breathing pattern.  - The less you use it, the better it will work when you need it. - Ok to use the inhaler up to 2 puffs  every 4 hours if you must but call for appointment if use goes up over your usual need - Don't leave home without it !!  (think of it like the spare tire for your car)   Plan C = Crisis (instead of Plan B but only if Plan B stops working) - only use your albuterol nebulizer if you first try Plan B and it fails to help > ok to use the nebulizer up to every 4 hours but if start needing it regularly call for immediate appointment   Plan D =  Prednisone increase to ceiling 20 mg and  a floor of 5 mg every other day   Please remember to go to the  x-ray department  for your tests - we will call you with the results when they are available    Please schedule a follow up visit in 3 months but call sooner if needed

## 2020-12-04 NOTE — Progress Notes (Signed)
Subjective:   Patient ID: Lacey Stanton, female    DOB: 1926/09/13   MRN: [id number]   Brief patient profile:  26  yowf never smoker/MM with obstructive  bronchiectasis with resp symptoms  of cough/sob dating back to  the 1980's   History of Present Illness  06/29/2012 f/u ov/Lacey Stanton re bronchiectasis on 02 just at hs and bud/brovana bid maint rx Chief Complaint  Patient presents with   Follow-up    Pt c/o increased SOB for the past month. She feels that nebs are not lasting long enough any longer.   at baseline maintaining brovana budesonide and rarely needing saba hfa Then one month pta fever no cough but sob and increased proaire and used 02  > ov with Dr Selena Batten rx Avelox and fever resolved but not breathing to her satisfaction and using proaire 2 x daily but no neb saba. Doe x > slow adls rec increase zantac (ranitidine) to 150 mg after bfast and after supper  GERD  diet Continue to use the nebulizer brovana and budesonide and then 12 hours later. Prednisone 10 mg take  4 each am x 2 days,   2 each am x 2 days,  1 each am x2days and stop  Work on inhaler technique    Only use your albuterol (proaire) as rescue    03/03/2018  f/u ov/Lacey Stanton re: obstructive bronchiectasis  Chief Complaint  Patient presents with   Follow-up    Pepcid not working, still having colored mucus, oxygen levels dropping even at rest, on oxygen at night, SOB  Dyspnea: 2 wheeled walker / mailbox 50 ft flat is her limit = MMRC3 = can't walk 100 yards even at a slow pace at a flat grade s stopping due to sob   Cough: about the same/ cipro really didn't help but can't tol avelox and pcn allergic per records/ not using flutter or mucinex as rec  Sleeping: bed flat/ 3 pillows SABA use: 3-4 x per week 02: 2lpm hs rec  Plan A = Automatic =  Brovana / Budesonide 1st thing in am and 12 hours later Plan B = Backup for breathing/ congestion Only use your albuterol as a rescue medication Work on inhaler technique:    Plan C = Crisis - only use your albuterol nebulizer if you first try Plan B  For cough / congestion > mucinex dm up to 1200 mg every 12 hours and use the flutter valve as much as possible  Prednisone 10 mg take  4 each am x 2 days,   2 each am x 2 days,  1 each am x 2 days and stop  Zpak   Sputum culture 03/31/18  E coli > rx omnicef x 7 days > much better    01/28/2020 acute ov/Lacey Stanton re: obstructive bronchiectasis  - sob worse x 4 weeks p one episode N and V but no additional episodes  Chief Complaint  Patient presents with   Follow-up    Obstructive bronchiectasis  Dyspnea:  Walking to living room daily o/w very little activity  Cough: can't tolerate mucinex 600 ok on mucinex junior/ minimal am yellow production/ using flutter valve/ has not used omnicef since onset of worse sob   Sleeping:  30-45 degrees x months SABA use: avg use once a day  02: 2lpm 24/7 does not titrate  rec Make sure you check your oxygen saturation  at your highest level of activity  to be sure it stays over 90% and adjust  02 flow upward to maintain this level if needed but remember to turn it back to previous settings when you stop (to conserve your supply).  Prednisone 10 mg take  4 each am x 2 days,   2 each am x 2 days,  1 each am x 2 days and stop  Zpak x 2 (on the 6th day just take one so 11 days of therapy Decrease Budesonide to once daily - no change on the brovana every 12 hours      08/28/2020 acute  ov/Lacey Stanton re: flare onset early July 2022  self rx omnicef x 2 no change then rx doxy x 10 days finished on 08/15/20  and prednisone 20 mg max from a baseline of 5 mg , presently on 5mg   Chief Complaint  Patient presents with   Acute Visit    Infection in lungs Omnicef didn't clear infection up. Times 1 month. Coughing and sob.   Dyspnea:  walks on 2 wheeled walker room to room at home and walks to neighbors x 50 ft flat  Cough: since late June yellow mucus better now  Sleeping: 30 degrees bed blocks  SABA  use: none  02: 2-3lpm  Covid status:   j/j only  Rec For flares > prednisone 20 mg daily until better then 10 mg daily x 5 days, then 5 mg daily  For nasty mucus >  doxy 100 mg twice daily  x 10 days  take with glass of water before eat For cough / congestion > mucinex or mucinex dm 1200 mg every 12 hours as needed and use the flutter valve as much as possible    10/31/2020  f/u ov/Lacey Stanton re: bronchiectasis maint on brovana/bud each am and just brovana in pm  Chief Complaint  Patient presents with   Follow-up    Patient has had a lot of throat clearing and congestion  Dyspnea:  2 wheeled walker / prednisone 10mg  floor  Cough: very little comes up > doxy usually works when turns purulent  Sleeping: 30 degrees with bed blocks SABA use: 3 x per month 02: 3lpm hs,   misunderstood instructions/ using p ex  Covid status:   JJ  only  Rec Make sure you check your oxygen saturation  at your highest level of activity  to be sure it stays over 90% and adjust  02 flow upward to maintain this level if needed but remember to turn it back to previous settings when you stop (to conserve your supply).       12/04/2020  acute ov/Lacey Stanton re: bronchiectasis   maint on bravana bid/ budesonide 0.5 daily and pred 10 mg one half floor and increased to 10 mg one day prior to ov and some better  Chief Complaint  Patient presents with   Acute Visit    Increased SOB for the past wk. She c/o feeling shaky and tired- this has been ongoing.    Dyspnea:  walks with 2 wheeled walker / desats x 2 weeks on rolling walking  Cough: white mucus  Sleeping: bed blocks/ 30 degrees hob SABA use: not using much  02: 3lpm 24/7   uses poc 3lpm when not at home  Covid status:   JJ only    No obvious day to day or daytime variability or assoc purulent sputum or mucus plugs or hemoptysis or cp or chest tightness, subjective wheeze or overt sinus or hb symptoms.   Sleeping  without nocturnal  or early am exacerbation  of  respiratory  c/o's or need for noct saba. Also denies any obvious fluctuation of symptoms with weather or environmental changes or other aggravating or alleviating factors except as outlined above   No unusual exposure hx or h/o childhood pna/ asthma or knowledge of premature birth.  Current Allergies, Complete Past Medical History, Past Surgical History, Family History, and Social History were reviewed in [employer] record.  ROS  The following are not active complaints unless bolded Hoarseness, sore throat, dysphagia, dental problems, itching, sneezing,  nasal congestion or discharge of excess mucus or purulent secretions, ear ache,   fever, chills, sweats, unintended wt loss or wt gain, classically pleuritic or exertional cp,  orthopnea pnd or arm/hand swelling  or leg swelling, presyncope, palpitations, abdominal pain, anorexia, nausea, vomiting, diarrhea  or change in bowel habits or change in bladder habits, change in stools or change in urine, dysuria, hematuria,  rash, arthralgias, visual complaints, headache, numbness, weakness or ataxia or problems with walking or coordination,  change in mood or  memory. Tremor        Current Meds  Medication Sig   arformoterol (BROVANA) 15 MCG/2ML NEBU USE 1 VIAL  IN  NEBULIZER TWICE  DAILY - Morning and Evening   BIOTIN PO Take 1,000 mg by mouth daily.   budesonide (PULMICORT) 0.5 MG/2ML nebulizer solution USE 1 VIAL  IN  NEBULIZER TWICE  DAILY - Rinse mouth after each treatment   Cholecalciferol (VITAMIN D-3) 5000 units TABS Take 1 tablet by mouth daily.   famotidine (PEPCID) 20 MG tablet TAKE ONE TABLET BY MOUTH EVERY DAY AT BEDTIME   ketoconazole (NIZORAL) 2 % cream Apply 1 fingertip amount to each foot daily.   levothyroxine (SYNTHROID, LEVOTHROID) 50 MCG tablet Take by mouth daily.   Melatonin 10 MG TABS Take 1 tablet by mouth at bedtime.   OXYGEN 2 lpm with sleep and as needed during the day   pantoprazole (PROTONIX) 40  MG tablet Take 1 tablet (40 mg total) by mouth daily. Take 30-60 MINUTES before first meal of the day   polyethylene glycol (MIRALAX / GLYCOLAX) 17 g packet Take 17 g by mouth daily.   predniSONE (DELTASONE) 10 MG tablet Take 2 daily until better, then 1 daily for 1 week, then 1/2 tab daily and stay   Prenatal Vit-Fe Sulfate-FA (PRENATAL MULTIVIT-IRON PO) Take 1 tablet by mouth daily.   PROAIR HFA 108 (90 Base) MCG/ACT inhaler Inhale 2 puffs into the lungs every 4 (four) hours as needed for Wheezing.   Probiotic CAPS Take 1 capsule by mouth daily.   Respiratory Therapy Supplies (FLUTTER) DEVI Use as directed   verapamil (CALAN-SR) 120 MG CR tablet Take 120 mg by mouth at bedtime.           Past Medical History:  Bronchiectasis  - CT 11/07/08: Bronchiectasis in the lingular > RML plus small HH  - Sinus CT neg 04/17/09  - HFA 25 > 75% effective April 15, 2009  -PFT's 09/16/2010   FEV1  .89( 55%)  Ratio 49  No better with B2,  DLCO 62 > improved to 107 Pneumonia > cleared radiographically 07/01/2010     - Pneumovax March 2011  Allergic Rhinitis  Pulmonary hemorrhage 1979   Osteoarthritis      - s/p  L TKR 02/2000 >>  Difficult recovery Hypothyroidism  GERD diet     Objective:   Physical Exam   12/04/2020   100  10/31/2020  101   08/28/2020      101  03/28/2020      98   01/28/2020     97  09/27/2019    106   08/09/2019   106   03/03/2018    115  08/09/2018    115  12/05/2017 113  Vital signs reviewed  12/04/2020  - Note at rest 02 sats  95% on 3lpm POC    General appearance:  Amb wf nad at rest  HEENT : pt wearing mask not removed for exam due to covid - 19 concerns.   NECK :  without JVD/Nodes/TM/ nl carotid upstrokes bilaterally   LUNGS: no acc muscle use,  Min barrel  contour chest wall with bilateral  slightly decreased bs s audible wheeze and  without cough on insp or exp maneuvers and min  Hyperresonant  to  percussion bilaterally     CV:  RRR  no s3 or murmur or increase  in P2, and no edema   ABD:  soft and nontender with pos end  insp Hoover's  in the supine position. No bruits or organomegaly appreciated, bowel sounds nl  MS:   Nl gait/  ext warm without deformities, calf tenderness, cyanosis or clubbing No obvious joint restrictions   SKIN: warm and dry without lesions    NEURO:  alert, approp, nl sensorium with  no motor or cerebellar deficits apparent.  Resting tremor present s rigidity       CXR PA and Lateral:   12/04/2020 :    I personally reviewed images and agree with radiology impression as follows:   The heart size and mediastinal contours are within normal limits. Stable right basilar scarring or subsegmental atelectasis is noted. Stable diffuse chronic densities are noted throughout both lungs. Stable old midthoracic compression fracture        Assessment & Plan:

## 2020-12-04 NOTE — Telephone Encounter (Signed)
Lm x1 for patient's daughter, Erskine Squibb.

## 2020-12-04 NOTE — Telephone Encounter (Signed)
Spoke with daughter  Worsening SOB over the past few days  No cough, fever  Feels shaky and concerned about o2 sats below lower than usual  OV with MW today at 11 am

## 2020-12-05 ENCOUNTER — Telehealth: Payer: Self-pay | Admitting: Pharmacy Technician

## 2020-12-05 ENCOUNTER — Other Ambulatory Visit (HOSPITAL_COMMUNITY): Payer: Self-pay

## 2020-12-05 NOTE — Telephone Encounter (Signed)
Patient Advocate Encounter  Received notification from [employer] that prior authorization for ALBUTEROL NEB SOL is required.   PA submitted on 11.11.22 Key [id number] Status is pending   [facility] will continue to follow  Ricke Hey, CPhT Patient Advocate Phone: [phone] Fax:  [phone]

## 2020-12-07 ENCOUNTER — Encounter: Payer: Self-pay | Admitting: Internal Medicine

## 2020-12-07 NOTE — Assessment & Plan Note (Signed)
03/28/13 Qualification  Patient Saturations on Room Air at Rest = 93% Patient Saturations on [employer] while Ambulating = 88% Patient Saturations on 2 Liters of oxygen while Ambulating = 97% - 10/22/2014  Walked RA x 2 laps @ 185 ft each stopped due sob at slow pace but sats still 90%   - 07/28/2015  Walked RA x 3 laps @ 185 ft each stopped due to  End of study, nl pace, no sob or desat   - 12/16 21 Patient Saturations on Room Air at Rest = 98% Patient Saturations on Room Air while Ambulating = 87% Patient Saturations on 2 pulse dose Liters of oxygen while Ambulating = 94%  Presently on 3 lpm 24/7 and advised Make sure you check your oxygen saturation  AT  your highest level of activity (not after you stop)   to be sure it stays over 90% and adjust  02 flow upward to maintain this level if needed but remember to turn it back to previous settings when you stop (to conserve your supply).          Each maintenance medication was reviewed in detail including emphasizing most importantly the difference between maintenance and prns and under what circumstances the prns are to be triggered using an action plan format where appropriate.  Total time for H and P, chart review, counseling, reviewing 02, neb device(s) and generating customized AVS unique to this acute office visit / same day charting = 22 min

## 2020-12-07 NOTE — Assessment & Plan Note (Signed)
Onset of symptoms 1980s - CT 11/07/08: Bronchiectasis in the lingula  > rml plus small HH  - Sinus CT neg 04/17/09   -PFT's 09/16/2010   FEV1  .89( 55%)  Ratio 49  No better with B2,  DLCO 62 > improved to 107 - PFTs  08/10/2012    FEV1  0.78 (50%) ratio 58,  No better p B2  DLCO 55 improved to 86% - Alpha one AT phenotype 07/07/10: MM CT chest>   03/16/13 bronchiectasis with nodules - Spirometry 02/10/2015  FEV1  0.54 (33%) ratio 53   - PFT's  07/28/2015  FEV1 0.82 (56 % ) ratio 57  p 3 % improvement from saba p laba/ics neb  prior to study with DLCO  48/53 % corrects to 83 % for alv volume   - 07/02/16 rec doxy prn flares  - PFT's  07/12/2017  FEV1 0.65 (48 % ) ratio 59  p no % improvement from saba p nothing prior to study with DLCO  45 % corrects to 75 % for alv volume   - 07/12/17 Changed prn abx to cipro 750 bid x 10 days as doxy no longer effective  - 03/03/2018  After extensive coaching inhaler device,  effectiveness =    75%  (short Ti) - 09/27/2018 changed prn abx to omnicef 300 mg bid x 7 days based on previous repsonse and sputum growing e coli 03/31/2018     - 01/28/19  Quant Gold TB neg - 02/19/20 started daily pred with ceiling 20 mg and floor 5 mg daily plus reduced brovana to one half vial bid due to tremor from full strength - 08/28/2020 changed to doxy x 10 d prn flare   Main issue is with tremor which may not be related to meds and she feels breathing overall worse so rec max brovana/bud and continue daily pred working toward the lowest effective dose, preferably 5 mg qod if tolerates with ceiling remaining at 5 mg qod

## 2020-12-08 MED ORDER — ALBUTEROL SULFATE (2.5 MG/3ML) 0.083% IN NEBU
2.5000 mg | INHALATION_SOLUTION | RESPIRATORY_TRACT | 12 refills | Status: DC | PRN
Start: 2020-12-08 — End: 2020-12-10

## 2020-12-08 NOTE — Addendum Note (Signed)
Addended by: Arvilla Market on: 12/08/2020 04:54 PM   Modules accepted: Orders

## 2020-12-08 NOTE — Telephone Encounter (Signed)
I called the patient and she wanted me to send in Albuterol nebulizer solution to [facility] mail order. Nothing further needed.

## 2020-12-08 NOTE — Telephone Encounter (Signed)
Received a fax regarding Prior Authorization from [employer] for ALBUTEROL NEB SOL. Authorization has been DENIED because PT RESIDES AT HOME AND NOT IN A FACILITY.

## 2020-12-10 ENCOUNTER — Telehealth: Payer: Self-pay | Admitting: Internal Medicine

## 2020-12-10 ENCOUNTER — Other Ambulatory Visit: Payer: Self-pay | Admitting: Internal Medicine

## 2020-12-10 MED ORDER — ALBUTEROL SULFATE (2.5 MG/3ML) 0.083% IN NEBU: 2.5000 mg | INHALATION_SOLUTION | RESPIRATORY_TRACT | 12 refills | Status: AC

## 2020-12-10 MED ORDER — ALBUTEROL SULFATE (2.5 MG/3ML) 0.083% IN NEBU: 2.5000 mg | INHALATION_SOLUTION | RESPIRATORY_TRACT | 12 refills | Status: DC | PRN

## 2020-12-10 NOTE — Telephone Encounter (Signed)
Order for the albuterol has been updated as needed and resent to [facility] pharmacy

## 2020-12-10 NOTE — Telephone Encounter (Signed)
I did call the pharmacy at [facility] and they stated that they did not receive the RX for the albuterol.  I have resent this  to them. Pt is aware.

## 2020-12-20 DIAGNOSIS — I361 Nonrheumatic tricuspid (valve) insufficiency: Secondary | ICD-10-CM

## 2020-12-20 DIAGNOSIS — I340 Nonrheumatic mitral (valve) insufficiency: Secondary | ICD-10-CM

## 2020-12-21 DIAGNOSIS — R011 Cardiac murmur, unspecified: Secondary | ICD-10-CM

## 2021-01-25 DEATH — deceased

## 2021-03-06 ENCOUNTER — Ambulatory Visit: Payer: Medicare Other | Admitting: Internal Medicine

## 2021-05-01 ENCOUNTER — Ambulatory Visit: Payer: Medicare Other | Admitting: Internal Medicine

## 2022-01-04 IMAGING — DX DG CHEST 2V
2 series · 2 of 2 positions shown · non-contrast
Comparison: 03/03/2018, 02/28/2019

CLINICAL DATA: Cough, chest pain, bronchiectasis

EXAM:
CHEST - 2 VIEW

[chest pa]
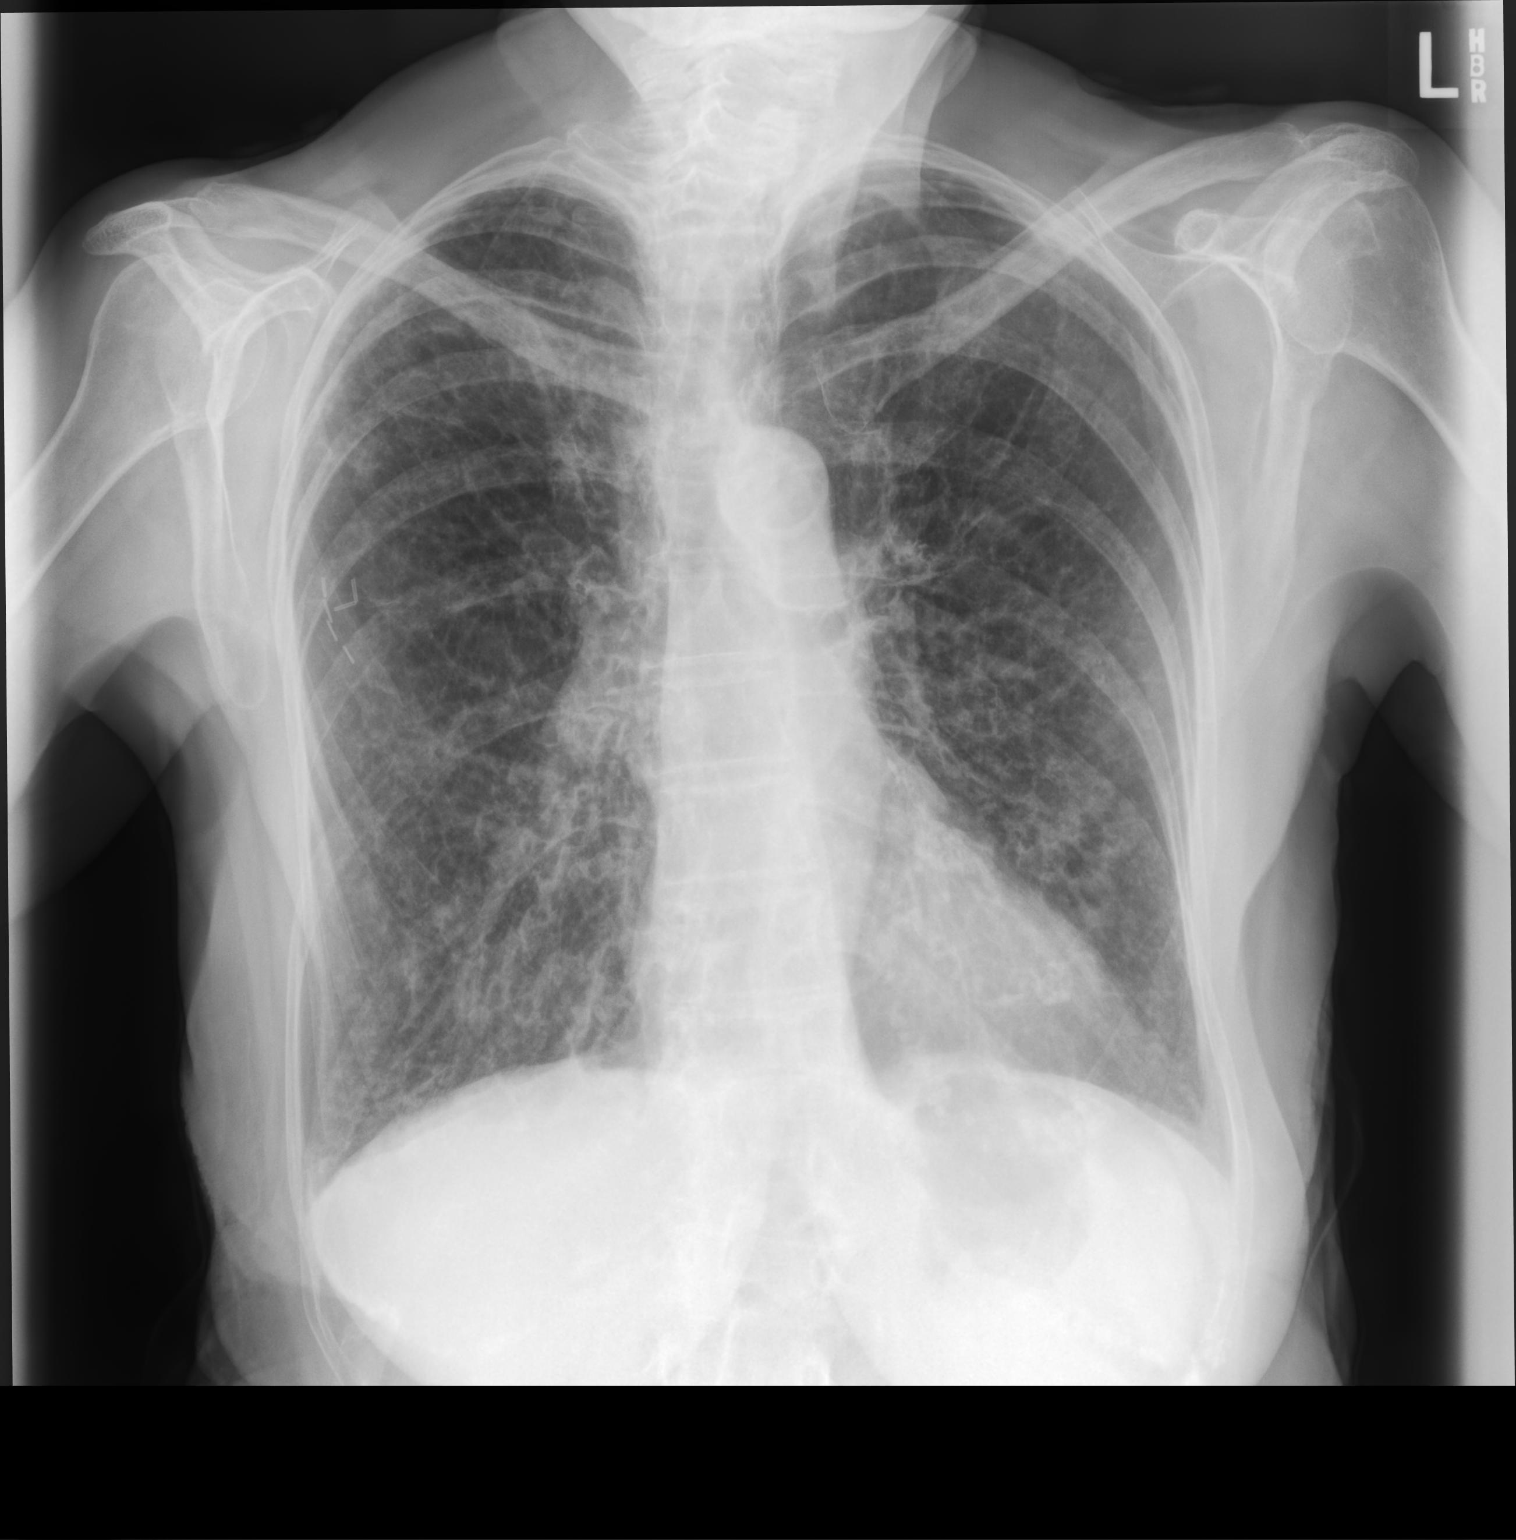

[chest lat]
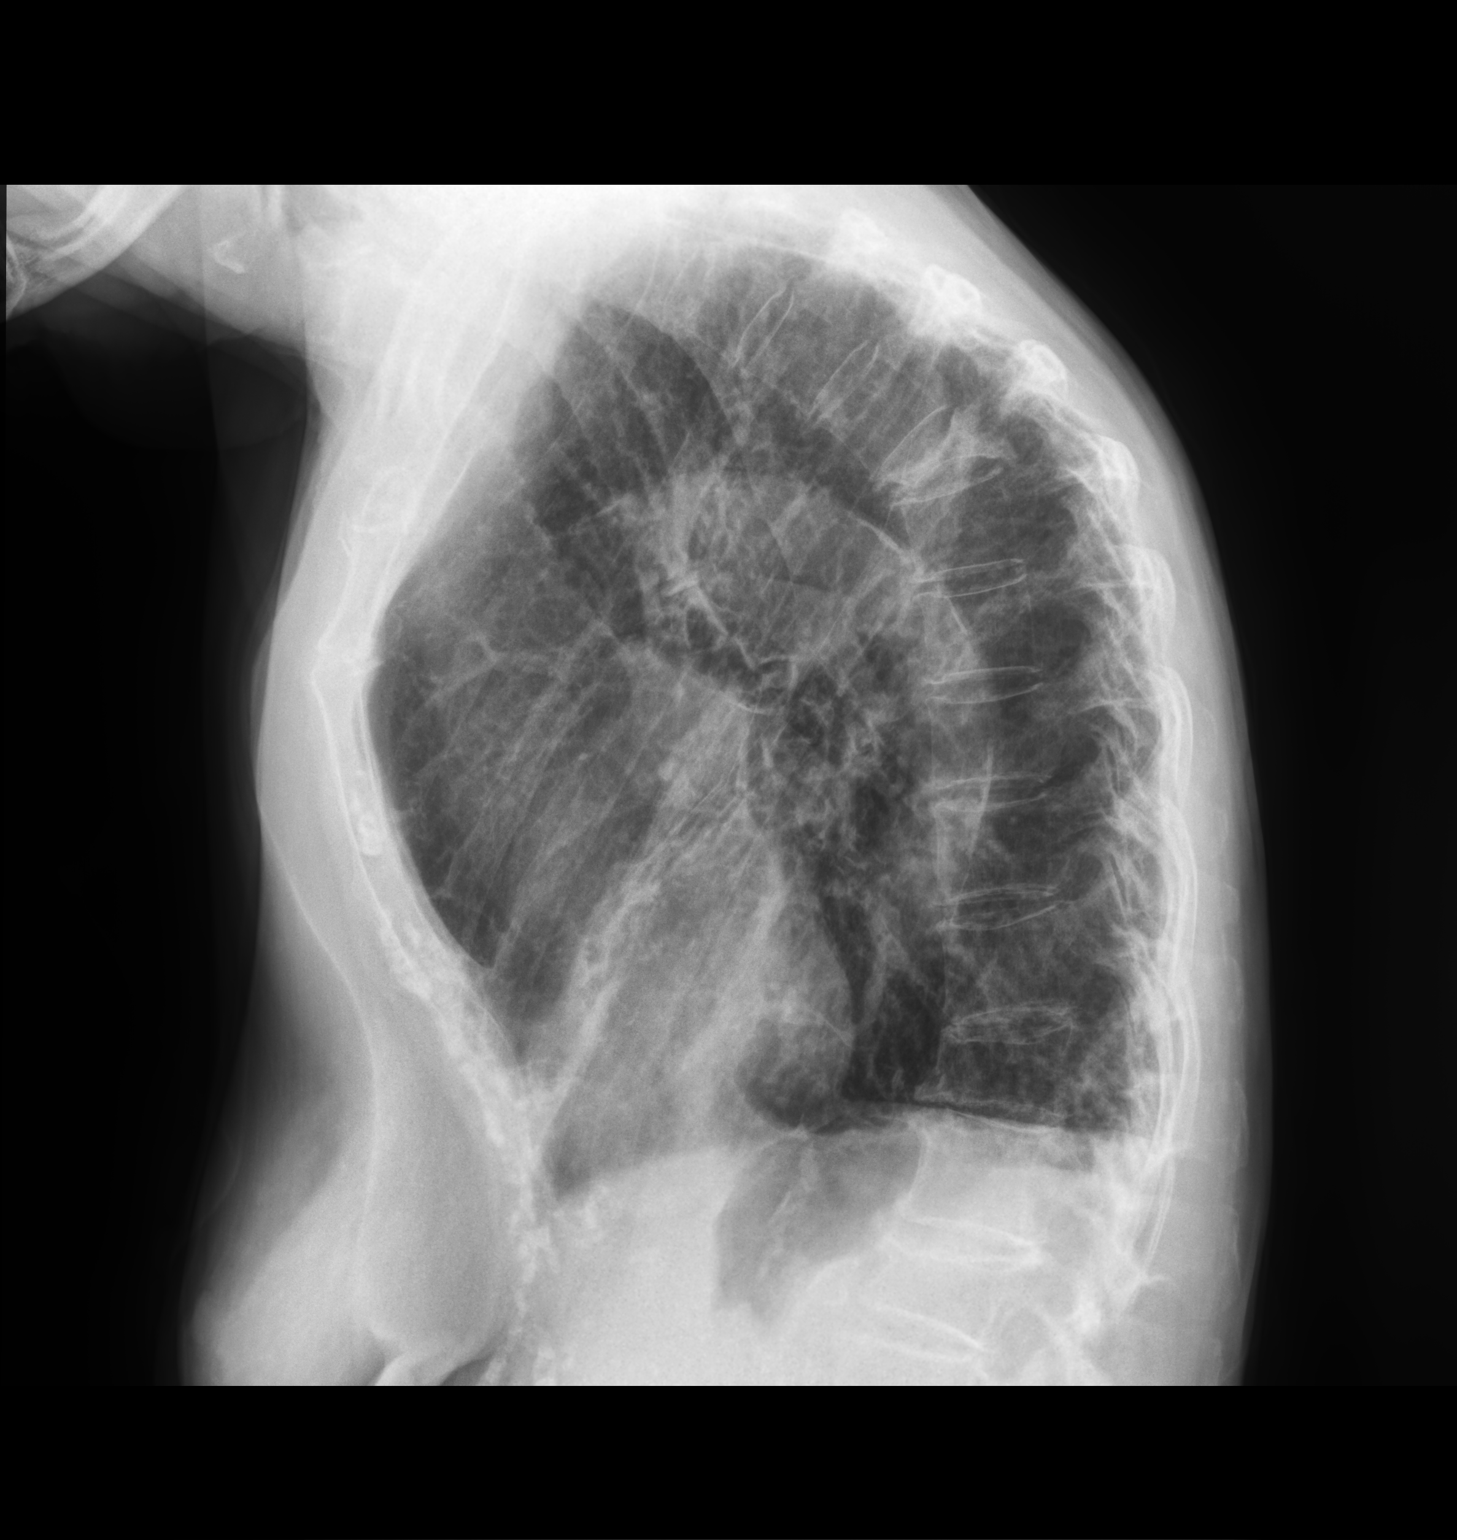

[2 of 2 positions shown; findings below may reference images not displayed]

FINDINGS: Frontal and lateral views of the chest demonstrate a stable cardiac
silhouette. Bilateral scarring and bronchiectasis again noted and
unchanged. No airspace disease, effusion, or pneumothorax.

Stable compression deformities at T6, T12, and L2.
IMPRESSION: 1. Diffuse scarring and bronchiectasis.  No acute airspace disease.
2. Stable compression deformities at T6, T12, and L2.
